# Patient Record
Sex: Female | Born: 1955 | Race: White | Hispanic: No | Marital: Married | Smoking: Current every day smoker
Health system: Southern US, Community
[De-identification: ages and names within clinical notes are randomized; demographics above are authoritative.]

## PROBLEM LIST (undated history)

## (undated) DIAGNOSIS — Z9289 Personal history of other medical treatment: Secondary | ICD-10-CM

## (undated) DIAGNOSIS — N979 Female infertility, unspecified: Secondary | ICD-10-CM

## (undated) DIAGNOSIS — E039 Hypothyroidism, unspecified: Secondary | ICD-10-CM

## (undated) DIAGNOSIS — L409 Psoriasis, unspecified: Secondary | ICD-10-CM

## (undated) DIAGNOSIS — I776 Arteritis, unspecified: Secondary | ICD-10-CM

## (undated) DIAGNOSIS — F329 Major depressive disorder, single episode, unspecified: Secondary | ICD-10-CM

## (undated) DIAGNOSIS — J449 Chronic obstructive pulmonary disease, unspecified: Secondary | ICD-10-CM

## (undated) DIAGNOSIS — Z8673 Personal history of transient ischemic attack (TIA), and cerebral infarction without residual deficits: Secondary | ICD-10-CM

## (undated) DIAGNOSIS — I10 Essential (primary) hypertension: Secondary | ICD-10-CM

## (undated) DIAGNOSIS — K219 Gastro-esophageal reflux disease without esophagitis: Secondary | ICD-10-CM

## (undated) DIAGNOSIS — F32A Depression, unspecified: Secondary | ICD-10-CM

## (undated) HISTORY — PX: CERVICAL CONIZATION W/BX: SHX1330

## (undated) HISTORY — PX: FOOT SURGERY: SHX648

## (undated) HISTORY — DX: Psoriasis, unspecified: L40.9

## (undated) HISTORY — DX: Personal history of other medical treatment: Z92.89

## (undated) HISTORY — DX: Personal history of transient ischemic attack (TIA), and cerebral infarction without residual deficits: Z86.73

## (undated) HISTORY — DX: Major depressive disorder, single episode, unspecified: F32.9

## (undated) HISTORY — DX: Depression, unspecified: F32.A

## (undated) HISTORY — DX: Hypothyroidism, unspecified: E03.9

## (undated) HISTORY — DX: Essential (primary) hypertension: I10

## (undated) HISTORY — DX: Gastro-esophageal reflux disease without esophagitis: K21.9

## (undated) HISTORY — DX: Female infertility, unspecified: N97.9

## (undated) HISTORY — DX: Arteritis, unspecified: I77.6

---

## 1997-10-16 ENCOUNTER — Encounter: Payer: Self-pay | Admitting: Family Medicine

## 1997-10-16 LAB — CONVERTED CEMR LAB: TSH: 2.88 microintl units/mL

## 1998-02-20 DIAGNOSIS — Z8673 Personal history of transient ischemic attack (TIA), and cerebral infarction without residual deficits: Secondary | ICD-10-CM

## 1998-02-20 HISTORY — DX: Personal history of transient ischemic attack (TIA), and cerebral infarction without residual deficits: Z86.73

## 1998-04-21 DIAGNOSIS — Z9289 Personal history of other medical treatment: Secondary | ICD-10-CM

## 1998-04-21 HISTORY — PX: OTHER SURGICAL HISTORY: SHX169

## 1998-04-21 HISTORY — DX: Personal history of other medical treatment: Z92.89

## 1998-05-18 ENCOUNTER — Other Ambulatory Visit: Admission: RE | Admit: 1998-05-18 | Discharge: 1998-05-18 | Payer: Self-pay | Admitting: Family Medicine

## 1998-07-22 DIAGNOSIS — Z9289 Personal history of other medical treatment: Secondary | ICD-10-CM

## 1998-07-22 HISTORY — DX: Personal history of other medical treatment: Z92.89

## 1998-10-01 ENCOUNTER — Encounter: Payer: Self-pay | Admitting: Neurology

## 1998-10-01 ENCOUNTER — Ambulatory Visit (HOSPITAL_COMMUNITY): Admission: RE | Admit: 1998-10-01 | Discharge: 1998-10-01 | Payer: Self-pay | Admitting: Neurology

## 1998-10-11 ENCOUNTER — Other Ambulatory Visit: Admission: RE | Admit: 1998-10-11 | Discharge: 1998-10-11 | Payer: Self-pay | Admitting: Family Medicine

## 1998-12-28 ENCOUNTER — Other Ambulatory Visit: Admission: RE | Admit: 1998-12-28 | Discharge: 1998-12-28 | Payer: Self-pay | Admitting: Obstetrics and Gynecology

## 1999-02-01 ENCOUNTER — Other Ambulatory Visit: Admission: RE | Admit: 1999-02-01 | Discharge: 1999-02-01 | Payer: Self-pay | Admitting: Obstetrics and Gynecology

## 1999-02-01 ENCOUNTER — Encounter (INDEPENDENT_AMBULATORY_CARE_PROVIDER_SITE_OTHER): Payer: Self-pay

## 1999-02-21 HISTORY — PX: PARTIAL HYSTERECTOMY: SHX80

## 1999-02-21 HISTORY — PX: CERVICAL CONE BIOPSY: SUR198

## 1999-02-25 ENCOUNTER — Ambulatory Visit (HOSPITAL_COMMUNITY): Admission: RE | Admit: 1999-02-25 | Discharge: 1999-02-25 | Payer: Self-pay | Admitting: Obstetrics and Gynecology

## 1999-02-25 ENCOUNTER — Encounter (INDEPENDENT_AMBULATORY_CARE_PROVIDER_SITE_OTHER): Payer: Self-pay

## 1999-03-07 ENCOUNTER — Encounter: Payer: Self-pay | Admitting: Obstetrics and Gynecology

## 1999-03-08 ENCOUNTER — Encounter (INDEPENDENT_AMBULATORY_CARE_PROVIDER_SITE_OTHER): Payer: Self-pay | Admitting: Specialist

## 1999-03-08 ENCOUNTER — Inpatient Hospital Stay (HOSPITAL_COMMUNITY): Admission: RE | Admit: 1999-03-08 | Discharge: 1999-03-11 | Payer: Self-pay | Admitting: Obstetrics and Gynecology

## 1999-09-29 ENCOUNTER — Other Ambulatory Visit: Admission: RE | Admit: 1999-09-29 | Discharge: 1999-09-29 | Payer: Self-pay | Admitting: Family Medicine

## 2000-11-20 LAB — HM DEXA SCAN: HM Dexa Scan: NORMAL

## 2000-12-03 ENCOUNTER — Other Ambulatory Visit: Admission: RE | Admit: 2000-12-03 | Discharge: 2000-12-03 | Payer: Self-pay | Admitting: Family Medicine

## 2002-01-21 ENCOUNTER — Other Ambulatory Visit: Admission: RE | Admit: 2002-01-21 | Discharge: 2002-01-21 | Payer: Self-pay | Admitting: Family Medicine

## 2003-01-23 ENCOUNTER — Other Ambulatory Visit: Admission: RE | Admit: 2003-01-23 | Discharge: 2003-01-23 | Payer: Self-pay | Admitting: Family Medicine

## 2003-12-25 ENCOUNTER — Ambulatory Visit: Payer: Self-pay | Admitting: Family Medicine

## 2004-01-22 ENCOUNTER — Ambulatory Visit: Payer: Self-pay

## 2004-10-25 ENCOUNTER — Ambulatory Visit: Payer: Self-pay | Admitting: Family Medicine

## 2004-10-25 ENCOUNTER — Other Ambulatory Visit: Admission: RE | Admit: 2004-10-25 | Discharge: 2004-10-25 | Payer: Self-pay | Admitting: Family Medicine

## 2004-10-25 LAB — CONVERTED CEMR LAB: Pap Smear: NORMAL

## 2004-10-26 ENCOUNTER — Encounter: Payer: Self-pay | Admitting: Family Medicine

## 2004-10-26 LAB — CONVERTED CEMR LAB: Pap Smear: NORMAL

## 2006-11-26 ENCOUNTER — Telehealth: Payer: Self-pay | Admitting: Family Medicine

## 2007-01-29 ENCOUNTER — Encounter: Payer: Self-pay | Admitting: Family Medicine

## 2007-01-29 DIAGNOSIS — L509 Urticaria, unspecified: Secondary | ICD-10-CM | POA: Insufficient documentation

## 2007-01-29 DIAGNOSIS — K219 Gastro-esophageal reflux disease without esophagitis: Secondary | ICD-10-CM | POA: Insufficient documentation

## 2007-01-29 DIAGNOSIS — I10 Essential (primary) hypertension: Secondary | ICD-10-CM | POA: Insufficient documentation

## 2007-01-29 DIAGNOSIS — F4323 Adjustment disorder with mixed anxiety and depressed mood: Secondary | ICD-10-CM | POA: Insufficient documentation

## 2007-01-29 DIAGNOSIS — Z8669 Personal history of other diseases of the nervous system and sense organs: Secondary | ICD-10-CM | POA: Insufficient documentation

## 2007-01-29 DIAGNOSIS — F1010 Alcohol abuse, uncomplicated: Secondary | ICD-10-CM | POA: Insufficient documentation

## 2007-01-29 DIAGNOSIS — E039 Hypothyroidism, unspecified: Secondary | ICD-10-CM | POA: Insufficient documentation

## 2007-01-29 DIAGNOSIS — L408 Other psoriasis: Secondary | ICD-10-CM | POA: Insufficient documentation

## 2007-01-29 DIAGNOSIS — F1910 Other psychoactive substance abuse, uncomplicated: Secondary | ICD-10-CM | POA: Insufficient documentation

## 2007-01-29 DIAGNOSIS — Z8679 Personal history of other diseases of the circulatory system: Secondary | ICD-10-CM | POA: Insufficient documentation

## 2007-02-04 ENCOUNTER — Ambulatory Visit: Payer: Self-pay | Admitting: Family Medicine

## 2007-02-04 DIAGNOSIS — F17200 Nicotine dependence, unspecified, uncomplicated: Secondary | ICD-10-CM | POA: Insufficient documentation

## 2007-02-04 DIAGNOSIS — M545 Low back pain, unspecified: Secondary | ICD-10-CM | POA: Insufficient documentation

## 2007-02-05 ENCOUNTER — Telehealth (INDEPENDENT_AMBULATORY_CARE_PROVIDER_SITE_OTHER): Payer: Self-pay | Admitting: *Deleted

## 2007-02-06 ENCOUNTER — Encounter: Payer: Self-pay | Admitting: Family Medicine

## 2007-02-08 ENCOUNTER — Encounter: Payer: Self-pay | Admitting: Family Medicine

## 2007-02-12 ENCOUNTER — Encounter (INDEPENDENT_AMBULATORY_CARE_PROVIDER_SITE_OTHER): Payer: Self-pay | Admitting: *Deleted

## 2007-02-12 LAB — CONVERTED CEMR LAB: Pap Smear: NORMAL

## 2007-06-28 ENCOUNTER — Telehealth: Payer: Self-pay | Admitting: Family Medicine

## 2007-08-13 ENCOUNTER — Telehealth (INDEPENDENT_AMBULATORY_CARE_PROVIDER_SITE_OTHER): Payer: Self-pay | Admitting: Internal Medicine

## 2008-03-13 ENCOUNTER — Telehealth: Payer: Self-pay | Admitting: Family Medicine

## 2008-08-27 ENCOUNTER — Telehealth: Payer: Self-pay | Admitting: Family Medicine

## 2008-12-15 ENCOUNTER — Ambulatory Visit: Payer: Self-pay | Admitting: Family Medicine

## 2008-12-25 ENCOUNTER — Encounter (INDEPENDENT_AMBULATORY_CARE_PROVIDER_SITE_OTHER): Payer: Self-pay | Admitting: *Deleted

## 2009-01-01 ENCOUNTER — Encounter: Payer: Self-pay | Admitting: Family Medicine

## 2009-01-07 ENCOUNTER — Encounter: Payer: Self-pay | Admitting: Family Medicine

## 2009-01-18 ENCOUNTER — Encounter (INDEPENDENT_AMBULATORY_CARE_PROVIDER_SITE_OTHER): Payer: Self-pay | Admitting: *Deleted

## 2009-01-18 LAB — HM MAMMOGRAPHY: HM Mammogram: NORMAL

## 2010-03-07 ENCOUNTER — Encounter: Payer: Self-pay | Admitting: Family Medicine

## 2010-03-07 ENCOUNTER — Ambulatory Visit: Payer: Self-pay | Admitting: Dermatology

## 2010-03-08 ENCOUNTER — Ambulatory Visit
Admission: RE | Admit: 2010-03-08 | Discharge: 2010-03-08 | Payer: Self-pay | Source: Home / Self Care | Attending: Family Medicine | Admitting: Family Medicine

## 2010-03-08 DIAGNOSIS — R599 Enlarged lymph nodes, unspecified: Secondary | ICD-10-CM | POA: Insufficient documentation

## 2010-03-08 DIAGNOSIS — L02419 Cutaneous abscess of limb, unspecified: Secondary | ICD-10-CM | POA: Insufficient documentation

## 2010-03-08 DIAGNOSIS — L538 Other specified erythematous conditions: Secondary | ICD-10-CM | POA: Insufficient documentation

## 2010-03-08 DIAGNOSIS — L03119 Cellulitis of unspecified part of limb: Secondary | ICD-10-CM

## 2010-03-20 LAB — CONVERTED CEMR LAB: Pap Smear: NORMAL

## 2010-03-24 NOTE — Assessment & Plan Note (Signed)
Summary: swollen lymph node in groin area/alc   Vital Signs:  Patient profile:   55 year old female Height:      64 inches Weight:      217.25 pounds BMI:     37.43 Temp:     98.5 degrees F oral Pulse rate:   72 / minute Pulse rhythm:   regular BP sitting:   110 / 86  (right arm) Cuff size:   large  Vitals Entered By: Linde Gillis CMA Duncan Dull) (March 08, 2010 1:48 PM) CC: swollen lymph node in groin area   History of Present Illness: has an outbreak of psoriasis - worse than it has ever been  seeing Dr Cheree Ditto -- derm had a bad infection of her leg last week - and they put her on abx  had a follow up on monday--is looking better  was wearing bandage to the knee  also had a doppler on her leg to r/o blood clot  tech saw a swollen LN in groin  on R side   is finally starting to feel better from her infection  pain is intermittent an not as bad   is on abx -? poss septra   .6-.8 cm LN seen in R groin on [place]      Allergies: 1)  ! Benadryl  Past History:  Past Medical History: Last updated: 01/29/2007 Depression GERD Hypertension Hypothyroidism Transient ischemic attack, hx of  Past Surgical History: Last updated: 01/29/2007 Hosp- 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 Eyes:  Denies blurring and eye irritation. CV:  Denies chest pain or discomfort and palpitations. Resp:  Denies cough, shortness of breath, and wheezing. GI:  Denies abdominal pain, nausea, and vomiting. GU:  Denies hematuria and urinary frequency. MS:  Complains of stiffness. Derm:  Complains of itching, lesion(s), and rash; a little itching in groin area . Neuro:  Denies numbness and tingling. Endo:  Denies cold intolerance and heat intolerance; does tend to stay sweaty. Heme:  Denies abnormal bruising and bleeding.  Physical Exam  General:  overweight but generally well appearing  Head:  normocephalic, atraumatic, and no abnormalities observed.   Neck:  supple with full rom and no masses or thyromegally, no JVD or carotid bruit  Lungs:  diffusely distant bs no crackles or wheeze Heart:  Normal rate and regular rhythm. S1 and S2 normal without gallop, murmur, click, rub or other extra sounds. Msk:  no acute joint changes  Pulses:  R and L carotid,radial,femoral,dorsalis pedis and posterior tibial pulses are full and equal bilaterally Extremities:  no cce  Skin:  psoriasis with scales and plaques diffusely area of erythema and small amt of streaking/ tenderness lat L lower leg - scabbing and no drainage  some erythema with satellite lesions in groin bilaterally  Cervical Nodes:  No lymphadenopathy noted Inguinal Nodes:  unable to palp any LN Psych:  normal affect, talkative and pleasant  Impression & Recommendations:  Problem # 1:  ENLARGEMENT OF LYMPH NODES (ICD-785.6) Assessment New small LN enl in groin that is non palpable seen on [place]  I think this is likely reactive from cellulitis of leg and also yeast dermatitis in groin/ and also psoriasis will continue abx for cellulitis and also start nystatin in groin re check [place] of LN in 6-8 weeks when imp Orders: Radiology Referral (Radiology)  Problem # 2:  INTERTRIGO, CANDIDAL  (ZOX-096.04) Assessment: New  tx this with nystatin  adv to keep it clean and dry  update if not imp  Orders: Prescription Created Electronically [phone])  Problem # 3:  CELLULITIS, LEG, LEFT (ICD-682.6) Assessment: New 2ndary infx from psoriasis that is improving with abx continue derm f/u  Complete Medication List: 1)  Wellbutrin Sr 150 Mg Tb12 (Bupropion hcl) .Marland Kitchen.. 1 by mouth two times a day 2)  Synthroid 50 Mcg Tabs (Levothyroxine sodium) .Marland Kitchen.. 1 by mouth daily 3)  Triamterene-hctz 37.5-25 Mg Tabs (Triamterene-hctz) .... 2  by mouth daily 4)  Cozaar 100 Mg Tabs (Losartan potassium) .Marland Kitchen.. 1 by mouth daily 5)  Prilosec 20 Mg Cpdr (Omeprazole) .Marland Kitchen.. 1 by mouth daily 6)  Chantix Starting Month Pak 0.5 Mg X 11 & 1 Mg X 42 Misc (Varenicline tartrate) .... Take by mouth as directed 7)  Chantix 1 Mg Tabs (Varenicline tartrate) .Marland Kitchen.. 1 by mouth two times a day to start after starter pack 8)  Nystatin 100000 Unit/gm Crea (Nystatin) .... Apply to redness in groin area two times a day  Patient Instructions: 1)  use nystatin on groin area for yeast  2)  finish your antibiotics as scheduled  3)  schedule ultrasound of R groin in 6-8 weeks to re check lymph node  4)  keep me updated if anything changes  Prescriptions: NYSTATIN 100000 UNIT/GM CREA (NYSTATIN) apply to redness in groin area two times a day  #1 medium x 1   Entered and Authorized by:   Judith Part MD   Signed by:   Judith Part MD on 03/08/2010   Method used:   Electronically to        [facility] [phone]* (retail)       [address]       [place], [place]  [address]       Ph: [phone]       Fax: [phone]   RxID:   [id number]    Orders Added: 1)  Radiology Referral [Radiology] 2)  Est. Patient Level III [[address]] 3)  Prescription Created Electronically [phone]    Current Allergies (reviewed today): ! BENADRYL

## 2010-04-26 ENCOUNTER — Telehealth: Payer: Self-pay | Admitting: Family Medicine

## 2010-05-09 ENCOUNTER — Encounter: Payer: Self-pay | Admitting: Family Medicine

## 2010-05-19 NOTE — Progress Notes (Signed)
Summary: Rt groin ultrasound  ---- Converted from flag ---- ---- 03/08/2010 3:56 PM, Carlton Adam wrote: R Groin Ultrasound at [facility] on 04/19/2010 at 10am.  ---- 03/08/2010 2:20 PM, Judith Part MD wrote: The following orders have been entered for this patient and placed on Admin Hold:  Type:     Referral       Code:   Radiology Description:   Radiology Referral Order Date:   03/08/2010   Authorized By:   Judith Part MD Order #:   [id number] Clinical Notes:   Name of Test or Procedure: refer for ultrasound of R groin in 6-8 weeks to re check small LN seen on ultrasound this week that is likely reactive  [facility] please  Of What:  Special Instructions ------------------------------  Phone Note Outgoing Call   Call placed by: Lewanda Rife LPN Call placed to: Patient Summary of Call: Spoke with Carly at [facility] medical records. Pt did not keep appt on 04/19/10 at 10am for rt groin ultrasound. Left message at pt's home and work # for patient to call back.  Initial call taken by: Lewanda Rife LPN,  April 26, 2010 11:33 AM  Follow-up for Phone Call        pt called and left v/m to call [phone].Left message on v/m for patient to call back. Lewanda Rife LPN  April 26, 1912 11:04 AM   Additional Follow-up for Phone Call Additional follow up Details #1::        Pt said that she had to cancel appt for ultrasound of groin because she has been seeing a vascular surgeon in [place] and the surgeon  said the problem with the lymph gland in  the groin is related to the wound on pt's leg. Pt said vascular dr has "killed portion of vein in leg" and she is to see vascular surgeon on 04/29/10 and the surgeon may refer pt to wound center for futher treatment. Pt will ask vascular surgeon in [place] to send Dr Milinda Antis pt's office notes.Lewanda Rife LPN  April 27, 7827 1:35 PM     Additional Follow-up for Phone Call Additional follow up Details #2::    thanks for the heads up - will rev notes when I get  them Follow-up by: Judith Part MD,  April 27, 2010 1:40 PM  Additional Follow-up for Phone Call Additional follow up Details #3:: Details for Additional Follow-up Action Taken: Left message for patient to call back. We still do not have notes from [facility] Dr. I plan to ask pt for [facility] dr's name and contact info.Lewanda Rife LPN  May 04, 2010 10:03 AM   Still unable to reach pt by phone. I mailed a letter requesting info for vascular dr in [place].Lewanda Rife LPN  May 09, 2010 3:17 PM

## 2010-05-19 NOTE — Letter (Signed)
Summary: Generic Letter  Adin at [facility]  [address] [place], [place] [address]   Phone: [phone]  Fax: [phone]    05/09/2010    [facility] [address] [place], [place]  [address]    Dear Ms. Cousins,  We spoke on 04/27/10 about you cancelling your appt for ultrasound of groin and  you were going to ask the vascular doctor in [place] to send your office notes to Dr. Milinda Antis. We still have not received the office notes from the doctor in [place] and I have left messages for you to please call Dr Royden Purl office so I might get the name and contact information for the vascular doctor in [place].   Please contact our office [phone] with the name and contact information for the vascular doctor in [place].       Sincerely,   Lewanda Rife LPN

## 2010-07-08 NOTE — Op Note (Signed)
[facility]  Patient:    Stephanie Raymond                     MRN: [id number] Proc. Date: 02/25/99 Adm. Date:  [id number] Attending:  Osborn Coho                           Operative Report  PREOPERATIVE DIAGNOSIS:       Cervical dysplasia.  Inadequate colposcopy.  POSTOPERATIVE DIAGNOSIS:      Cervical dysplasia.  Inadequate colposcopy.  OPERATION:                    Cold knife conization.  SURGEON:                      Mark E. Dareen Piano, M.D.  ASSISTANT:  ANESTHESIA:                   General anesthesia.  ANTIBIOTICS:                  None.  DRAINS:                       Red rubber catheter to bladder.  ESTIMATED BLOOD LOSS:         25 cc.  COMPLICATIONS:                None.  SPECIMENS:                    Cervical cone sent to pathology.  DESCRIPTION OF PROCEDURE:     The patient was taken to the operating room where a general anesthesia was administered without complications.  She was then placed in the dorsal lithotomy position and prepped with Hibiclens.  Her bladder was drained with a red rubber catheter.  A weighted speculum was placed in the vagina.  A single tooth tenaculum was applied to the anterior cervical lip.  The cervix was then injected with 1% lidocaine.  A suture was then placed at 3 and 9 oclock in  the fornix for hemostasis.  0 chromic suture was used.  Next, an 11 blade knife was used to create a cone specimen.  Deep conization was performed.  The remaining cervix was then made hemostatic with 0 chromic suture.  The patient was taken to the recovery room in stable condition.  Sponge, needle, and instrument counts were correct x 2. DD:  02/25/99 TD:  02/26/99 Job: [id number]

## 2010-07-21 ENCOUNTER — Ambulatory Visit: Payer: Self-pay | Admitting: Internal Medicine

## 2010-07-26 ENCOUNTER — Encounter: Payer: Self-pay | Admitting: Family Medicine

## 2010-07-27 ENCOUNTER — Encounter: Payer: Self-pay | Admitting: Family Medicine

## 2010-07-27 ENCOUNTER — Ambulatory Visit (INDEPENDENT_AMBULATORY_CARE_PROVIDER_SITE_OTHER): Payer: 59 | Admitting: Family Medicine

## 2010-07-27 VITALS — BP 130/84 | HR 100 | Temp 98.3°F | Ht 64.0 in | Wt 219.0 lb

## 2010-07-27 DIAGNOSIS — I10 Essential (primary) hypertension: Secondary | ICD-10-CM

## 2010-07-27 MED ORDER — BUPROPION HCL ER (SR) 150 MG PO TB12: 300.0000 mg | ORAL_TABLET | Freq: Every day | ORAL | Status: DC

## 2010-07-27 MED ORDER — LEVOTHYROXINE SODIUM 50 MCG PO TABS: 50.0000 ug | ORAL_TABLET | Freq: Every day | ORAL | Status: DC

## 2010-07-27 MED ORDER — LOSARTAN POTASSIUM 100 MG PO TABS: 100.0000 mg | ORAL_TABLET | Freq: Every day | ORAL | Status: DC

## 2010-07-27 MED ORDER — OMEPRAZOLE 20 MG PO CPDR: 20.0000 mg | DELAYED_RELEASE_CAPSULE | Freq: Every day | ORAL | Status: DC

## 2010-07-27 MED ORDER — TRIAMTERENE-HCTZ 37.5-25 MG PO TABS: 1.0000 | ORAL_TABLET | Freq: Every day | ORAL | Status: DC

## 2010-07-27 NOTE — Progress Notes (Signed)
Subjective:    Patient ID: Stephanie Raymond, female    DOB: 01/19/1956, 55 y.o.   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- did an ablation / and sclerosis  Still dealing with the wound -- not in the pain she was in  Has next follow up in July   Severe outbreak of psoriasis  Derm put her on humira -- was miserable -- 3 mo and was not helping  Then give px for cyclosporin -- big dose (and warned this could inc  Bp) Also laser tx for psoriasis  bp 186/106 -- at derm-- walked her down to urgent care  Was seen and went down to 150s on clonidine  Attributes it to the cyclospirin   Now at home her bp is back to normal  No cp or sob or ha or edema  No problems with her current bp med   Has been much more stressed- problems at home - unfortunately This may contribute to the worse psoriasis   Still drinking - but much less Disc eff of this on her bp as well   Needs px refilled until f/u for lab and PE in fall   Patient Active Problem List  Diagnoses  . HYPOTHYROIDISM  . ALCOHOL ABUSE  . TOBACCO USE  . DRUG ABUSE  . DEPRESSION  . HYPERTENSION  . GERD  . PSORIASIS  . URTICARIA  . BACK PAIN, LUMBAR  . CARPAL TUNNEL SYNDROME, HX OF  . TRANSIENT ISCHEMIC ATTACK, HX OF  . CELLULITIS, LEG, LEFT  . INTERTRIGO, CANDIDAL  . ENLARGEMENT OF LYMPH NODES   Past Medical History  Diagnosis Date  . Depression   . GERD (gastroesophageal reflux disease)   . Hypertension   . Hypothyroidism   . Hx of transient ischemic attack (TIA) 2000    Hosp ?  Marland Kitchen Infertility, female   . History of CT scan 04/1998    small lacunar infarcts  . History of MRI of brain and brain stem 07/1998    white matter changes   Past Surgical History  Procedure Date  . Cervical conization w/bx 1980's  . Carotid ultrasound 04/1998    neg  . Foot surgery     toe treated,  as a child  . Cervical cone biopsy 02/1999    microinvasive cervical carcinoma  . Partial hysterectomy 02/1999   History  Substance Use Topics  . Smoking status: Current Everyday Smoker  . Smokeless tobacco: Not on file  . Alcohol Use: Not on file   Family History  Problem Relation Age of Onset  . Emphysema Mother     smoker  . Heart disease Father 58    CABG  . Heart disease Other     MI in 39s  . Heart disease Other     CHF  . Hypertension Other    Allergies  Allergen Reactions  . Diphenhydramine Hcl     REACTION: feel funny   No current outpatient prescriptions on file prior to visit.       Review of Systems Review of Systems  Constitutional: Negative for fever, appetite change, fatigue and unexpected weight change.  Eyes: Negative for pain and visual disturbance.  Respiratory: Negative for cough and shortness of breath.   Cardiovascular: Negative for cp or sob or palp.   Gastrointestinal: Negative for nausea, diarrhea  and constipation.  Genitourinary: Negative for urgency and frequency.  Skin: Negative for pallor. Pos for rash- psoriasis  Neurological: Negative for weakness, light-headedness, numbness and headaches.  Hematological: Negative for adenopathy. Does not bruise/bleed easily.  Psychiatric/Behavioral: Negative for dysphoric mood. The patient is somewhat anx due to social situation          Objective:   Physical Exam  Constitutional: She appears well-developed and well-nourished.       overwt and well appearing   HENT:  Head: Normocephalic and atraumatic.  Mouth/Throat: Oropharynx is clear and moist.  Eyes: Conjunctivae and EOM are normal. Pupils are equal, round, and reactive to light.  Neck: Normal range of motion. Neck supple. No JVD present. No thyromegaly present.  Cardiovascular: Normal rate, regular rhythm, normal heart sounds and intact distal pulses.        Pulse in mid 80s at rest   Pulmonary/Chest: Effort normal. No respiratory  distress. She has wheezes. She has no rales. She exhibits no tenderness.       Mild wheezes at bases  Abdominal: Soft. Bowel sounds are normal. She exhibits no distension, no abdominal bruit and no mass. There is no hepatosplenomegaly. There is no tenderness.  Musculoskeletal: She exhibits no edema and no tenderness.  Lymphadenopathy:    She has no cervical adenopathy.  Neurological: She is alert. She has normal reflexes.       Baseline hand tremor at rest - suspect related to alcohol?  Skin: Skin is warm and dry. Rash noted. No pallor.       Diffuse psoriasis   Psychiatric: She has a normal mood and affect.       Has tremor but not seemingly nervous today Spirits are up          Assessment & Plan:

## 2010-07-27 NOTE — Assessment & Plan Note (Signed)
bp was up on cyclospirin and now back down to baseline  Is off med No change in HTN meds Disc lifestyle change  Refilled meds Lab and PE in early fall

## 2010-07-27 NOTE — Patient Instructions (Signed)
Blood pressure is reassuring  We will continue to watch it  Avoid high sodium foods and drink a lot of water  Try to keep fit  Schedule PE with labs prior in early fall

## 2010-07-28 ENCOUNTER — Telehealth: Payer: Self-pay | Admitting: *Deleted

## 2010-07-28 NOTE — Telephone Encounter (Signed)
Could not find fax # and office closed already will call in AM to get fax.

## 2010-07-28 NOTE — Telephone Encounter (Signed)
Also, someone from Dr. Dwana Curd (dermatology) office called regarding the pt's elevated BP and Dr.Graham would like for you to call her tomorrow at [phone].

## 2010-07-28 NOTE — Telephone Encounter (Signed)
Pt was seen yesterday and given several scripts.  She was given scripts for maxzide and cozaar, but these were written for 30 day supplies and pt needs scripts for 90 day supplies.  She is asking that the scripts be rewritten and mailed to her at her home address.

## 2010-07-28 NOTE — Telephone Encounter (Signed)
Could you please do me a favor and fax over a copy of my recent progress note with her to her dermatologist? - thanks Then send the message back to me

## 2010-07-29 NOTE — Telephone Encounter (Signed)
Faxed 07/27/10 progress note to Dr Cheree Ditto [phone] fax.

## 2010-08-02 ENCOUNTER — Other Ambulatory Visit: Payer: Self-pay | Admitting: *Deleted

## 2010-08-02 MED ORDER — LOSARTAN POTASSIUM 100 MG PO TABS: 100.0000 mg | ORAL_TABLET | Freq: Every day | ORAL | Status: DC

## 2010-08-02 MED ORDER — TRIAMTERENE-HCTZ 37.5-25 MG PO TABS: 1.0000 | ORAL_TABLET | Freq: Every day | ORAL | Status: DC

## 2010-08-02 NOTE — Telephone Encounter (Signed)
Left message on pt's home and work v/m for pt to call back. Rxs mailed to pt's home address as instructed.

## 2010-08-02 NOTE — Telephone Encounter (Signed)
Pt is requesting written 90 day scripts for maxzide and cozaar, she wants these mailed to her. She was given 30 day scripts at her last office visit, but that isnt what she wanted.  Please mail to pt's home address.

## 2010-08-02 NOTE — Telephone Encounter (Signed)
I thought we re did cozaar for 90 - but just in case will do again  Px printed for pick up in IN box

## 2010-08-02 NOTE — Telephone Encounter (Signed)
Patient notified as instructed by telephone. Confirmed pt's mailing address and Geraldine Contras said OK to mail rx that is not controlled substance.

## 2010-08-05 ENCOUNTER — Telehealth: Payer: Self-pay

## 2010-08-05 MED ORDER — TRIAMTERENE-HCTZ 37.5-25 MG PO TABS: 2.0000 | ORAL_TABLET | Freq: Every day | ORAL | Status: DC

## 2010-08-05 NOTE — Telephone Encounter (Signed)
Pt received rx and pt takes Maxzide 37.5/25 two tabs daily. Pt wants rx mailed to her home.

## 2010-08-05 NOTE — Telephone Encounter (Signed)
Maxzide rx mailed to pt as instructed.

## 2010-09-02 ENCOUNTER — Ambulatory Visit: Payer: Self-pay | Admitting: Family Medicine

## 2010-09-13 ENCOUNTER — Telehealth: Payer: Self-pay | Admitting: *Deleted

## 2010-09-13 NOTE — Telephone Encounter (Signed)
Pt is again requesting that you call her dermatologist regarding her BP.  She wants to discuss pt using clonidine to control her BP.  If you are unable to speak with Dr. Cheree Ditto pt is asking if you will prescribe this for her.  [facility] [place]  Dr. Dwana Curd number is [phone].

## 2010-09-13 NOTE — Telephone Encounter (Signed)
Patient notified as instructed by telephone. Pt said she felt so bad today(felt like BP up) she came home from work. Pt said she cannot come in to see Dr Milinda Antis tomorrow because she will be in [place]. Pt said she has appt to see Dr Cheree Ditto on Thurs and she will get BP taken there and call the reading. Pt has #10 Clonidine 0.2mg  at home and pt said she is only supposed to take one if BP is elevated. Pt said she is not supposed to take daily. Pt said she will call Dr Royden Purl office tomorrow around 10am to see if can work pt in schedule on Friday and to clarify directions for Clonidine. I advised pt if she felt like BP was elevated tonight to go to UC or ER to get BP taken. Pt said she does not have BP cuff at home.

## 2010-09-13 NOTE — Telephone Encounter (Signed)
I spoke to Dr Cheree Ditto  She told me that pt is on small dose and cyclosporine and also small dose of clonidine to control bp It is very important that I follow her bp for this  She needs appt with me as soon as we can arrange one I do not want her to stop clonidine suddenly as this can cause side effects  Let me know her dose and frequency and see if she needs small amt called in to get her through until I see her  In addition Dr Cheree Ditto stressed that it is vitally important that she gets bp checked twice weekly and calls the results in to her office as well  I will disc further when I see her

## 2010-09-13 NOTE — Telephone Encounter (Signed)
I want her to go ahead and take the clonidine daily  F/u with Dr Cheree Ditto on Thursday  Strongly consider getting bp cuff for home - I recommend OMRON for the arm available at most drug stores/ [facility]/ [employer] etc  Put her in my 4:15 for Friday if it is open please  Thanks  Please add clonidine .2mg  to her med list -- sounds like she has enough to get to friday

## 2010-09-14 NOTE — Telephone Encounter (Signed)
Patient notified as instructed by telephone. Appt with Dr Milinda Antis scheduled 09/16/10 at 4:15p[m. Pt will call with BP reading from Dr Cheree Ditto. Pt will also call back if worsens before appt. Med list updated as instructed.

## 2010-09-14 NOTE — Telephone Encounter (Signed)
Left v/m at home and cell # for pt to call back. 

## 2010-09-16 ENCOUNTER — Ambulatory Visit (INDEPENDENT_AMBULATORY_CARE_PROVIDER_SITE_OTHER): Payer: 59 | Admitting: Family Medicine

## 2010-09-16 ENCOUNTER — Encounter: Payer: Self-pay | Admitting: Family Medicine

## 2010-09-16 DIAGNOSIS — F1010 Alcohol abuse, uncomplicated: Secondary | ICD-10-CM

## 2010-09-16 DIAGNOSIS — E039 Hypothyroidism, unspecified: Secondary | ICD-10-CM

## 2010-09-16 DIAGNOSIS — I10 Essential (primary) hypertension: Secondary | ICD-10-CM

## 2010-09-16 DIAGNOSIS — L408 Other psoriasis: Secondary | ICD-10-CM

## 2010-09-16 DIAGNOSIS — F17200 Nicotine dependence, unspecified, uncomplicated: Secondary | ICD-10-CM

## 2010-09-16 MED ORDER — CLONIDINE HCL 0.2 MG PO TABS: 0.2000 mg | ORAL_TABLET | Freq: Every day | ORAL | Status: DC

## 2010-09-16 NOTE — Progress Notes (Signed)
Subjective:    Patient ID: Stephanie Raymond, female    DOB: 10/11/1955, 55 y.o.   MRN: [id number]  HPI Here for f/u of elevated bp with cyclosporine tx of her psoriasis (is on small dose) I did speak with her dermatologist Dr Cheree Ditto at [facility]- wants to proced with this tx so started her on clonidine.2 daily   mg daily  (also had a long conversation about compliance/ regular twice weekly bp checks / imp of not w/d med too quickly / plan for intermittent cyclosporine-- will be quite tricky)  Will be on pulse therapy  On higher doses - had severely high blood pressure   Wanted several bp checks weekly Is going to get omron cuff for arm  Feels fine on clonidine - no side eff at all   Pt states she is still drinking but much less Understands dangers of alcohol with current meds Not ready to quit altogether  Will not tell me what her daily intake is Has not had any withdrawal Always has a lot of stress    Was supposed to go to derm yesterday  Had an arm problem - and had to see ortho - given a cortisone shot  Feels much better now  Did tell her derm why she had to cancel Is ready to buy her bp cuff  Also getting laser therapy on her legs - that works well - had a non healing wound for a while but now is improved   Patient Active Problem List  Diagnoses  . HYPOTHYROIDISM  . ALCOHOL ABUSE  . TOBACCO USE  . DEPRESSION  . HYPERTENSION  . GERD  . PSORIASIS  . URTICARIA  . BACK PAIN, LUMBAR  . CARPAL TUNNEL SYNDROME, HX OF  . TRANSIENT ISCHEMIC ATTACK, HX OF  . INTERTRIGO, CANDIDAL  . ENLARGEMENT OF LYMPH NODES   Past Medical History  Diagnosis Date  . Depression   . GERD (gastroesophageal reflux disease)   . Hypertension   . Hypothyroidism   . Hx of transient ischemic attack (TIA) 2000    Hosp ?  [facility], [facility]   . History of CT scan 04/1998    small lacunar infarcts  . History of MRI of brain and brain stem 07/1998    white matter changes   Past Surgical  History  Procedure Date  . Cervical conization w/bx 1980's  . Carotid ultrasound 04/1998    neg  . Foot surgery     toe treated, as a child  . Cervical cone biopsy 02/1999    microinvasive cervical carcinoma  . Partial hysterectomy 02/1999   History  Substance Use Topics  . Smoking status: Current Everyday Smoker  . Smokeless tobacco: Not on file  . Alcohol Use: Not on file   Family History  Problem Relation Age of Onset  . Emphysema Mother     smoker  . Heart disease Father 74    CABG  . Heart disease Other     MI in 26s  . Heart disease Other     CHF  . Hypertension Other    Allergies  Allergen Reactions  . Diphenhydramine Hcl     REACTION: feel funny   Current Outpatient Prescriptions on File Prior to Visit  Medication Sig Dispense Refill  . buPROPion (WELLBUTRIN SR) 150 MG 12 hr tablet Take 2 tablets (300 mg total) by mouth daily.  180 tablet  1  . Cholecalciferol (VITAMIN D-3 PO) Take 1 capsule by mouth daily.        Marland Kitchen  levothyroxine (SYNTHROID) 50 MCG tablet Take 1 tablet (50 mcg total) by mouth daily.  90 tablet  1  . losartan (COZAAR) 100 MG tablet Take 1 tablet (100 mg total) by mouth daily.  90 tablet  3  . omeprazole (PRILOSEC) 20 MG capsule Take 1 capsule (20 mg total) by mouth daily.  90 capsule  1  . triamterene-hydrochlorothiazide (MAXZIDE-25) 37.5-25 MG per tablet Take 2 tablets by mouth daily.  180 tablet  3  . vitamin B-12 (CYANOCOBALAMIN) 500 MCG tablet Take 500 mcg by mouth daily.              Review of Systems Review of Systems  Constitutional: Negative for fever, appetite change, fatigue and unexpected weight change.  Eyes: Negative for pain and visual disturbance. or pain Respiratory: Negative for cough and shortness of breath.   Cardiovascular: Negative.  for cp or palpitations Gastrointestinal: Negative for nausea, diarrhea and constipation.  Genitourinary: Negative for urgency and frequency.  Skin: Negative for pallor. pos for rash of  psoriasis  Neurological: Negative for weakness, light-headedness, numbness and headaches.  Hematological: Negative for adenopathy. Does not bruise/bleed easily.  Psychiatric/Behavioral: Negative for dysphoric mood. Pos for chronic anx and stressors         Objective:   Physical Exam  Constitutional: She appears well-developed and well-nourished. No distress.       overwt and well appearing   HENT:  Head: Normocephalic and atraumatic.  Nose: Nose normal.  Mouth/Throat: Oropharynx is clear and moist.  Eyes: Conjunctivae and EOM are normal. Pupils are equal, round, and reactive to light. No scleral icterus.  Neck: Normal range of motion. Neck supple. No JVD present. Carotid bruit is not present. No thyromegaly present.  Cardiovascular: Normal rate, regular rhythm, normal heart sounds and intact distal pulses.   Pulmonary/Chest: Effort normal and breath sounds normal. No respiratory distress. She has no wheezes.  Abdominal: Soft. Bowel sounds are normal. She exhibits no distension and no mass. There is no tenderness.  Musculoskeletal: She exhibits no edema.       No acute joint changes   Lymphadenopathy:    She has no cervical adenopathy.  Neurological: She is alert. She has normal reflexes.       Baseline hand tremor with rest and intention  Skin: Skin is warm and dry.       Psoriasis is slt improved Worst on legs   Psychiatric: She has a normal mood and affect. Her behavior is normal.       Is a little anxious but overall in good mood today Good eye contact and communication skills           Assessment & Plan:   No problem-specific assessment & plan notes found for this encounter.

## 2010-09-16 NOTE — Assessment & Plan Note (Signed)
Again stressed imp of minimizing alcohol with intent to quit Esp with current meds and conditions

## 2010-09-16 NOTE — Assessment & Plan Note (Addendum)
Now back on low dose cyclosporine and will need to tx and watch bp carefully Refilled clonidine.2  Will have regular derm f/u and biweekly bp - disc cuff to get for home  Also aware not to come off med urgently  >25 min spent with face to face with patient, >50% counseling and/or coordinating care   Disc low salt diet Disc minimizing alcohol and working on smoking cessation (down to 2 cig per day)  In addn-health mt visit in about 2 mo Lab now (expect elevated lft due to alcohol intake)

## 2010-09-16 NOTE — Patient Instructions (Addendum)
Continue the clonidine and do not miss doses  If any side effects let me know  Get your bp cuff OMRON for the arm size regular  Keep sending Dr Cheree Ditto bp reading twice weekly and update me if bp is above 140/90 Also watch salt in diet  Blood pressure on 2nd check was 128/80 -- good  Schedule PE in about 2 months (end of the day appt --and 30 minute appt we can put together)  Labs today

## 2010-09-16 NOTE — Assessment & Plan Note (Addendum)
Lab Results  Component Value Date   TSH 2.88 10/16/1997   Lab today - also in light of htn Disc further at 2 mo f/u

## 2010-09-18 NOTE — Assessment & Plan Note (Signed)
Pt is smoking less and working on quitting  Enc her Disc in detail risks of smoking and possible outcomes including copd, vascular/ heart disease, cancer , respiratory and sinus infections  Pt voices understanding

## 2010-09-18 NOTE — Assessment & Plan Note (Signed)
On low dose cyclosporine- pulse tx  Had long disc with her dermatologist by phone over long term plan / elevation in bp and need to tx this and monitor very closely  Will continue with twice weekly bp and close f/u  It is improving on the cyclosporine

## 2010-09-19 ENCOUNTER — Other Ambulatory Visit: Payer: 59

## 2010-09-20 ENCOUNTER — Other Ambulatory Visit: Payer: 59

## 2010-09-21 ENCOUNTER — Other Ambulatory Visit: Payer: 59

## 2010-09-23 ENCOUNTER — Other Ambulatory Visit: Payer: Self-pay | Admitting: Family Medicine

## 2010-09-23 DIAGNOSIS — I10 Essential (primary) hypertension: Secondary | ICD-10-CM

## 2010-09-23 DIAGNOSIS — E039 Hypothyroidism, unspecified: Secondary | ICD-10-CM

## 2010-10-14 ENCOUNTER — Ambulatory Visit (INDEPENDENT_AMBULATORY_CARE_PROVIDER_SITE_OTHER): Payer: 59 | Admitting: Family Medicine

## 2010-10-14 ENCOUNTER — Encounter: Payer: Self-pay | Admitting: Family Medicine

## 2010-10-14 ENCOUNTER — Other Ambulatory Visit: Payer: Self-pay | Admitting: *Deleted

## 2010-10-14 VITALS — BP 150/100 | HR 68 | Temp 98.3°F | Wt 206.0 lb

## 2010-10-14 DIAGNOSIS — R112 Nausea with vomiting, unspecified: Secondary | ICD-10-CM | POA: Insufficient documentation

## 2010-10-14 MED ORDER — ONDANSETRON HCL 4 MG PO TABS: 4.0000 mg | ORAL_TABLET | Freq: Every day | ORAL | Status: DC | PRN

## 2010-10-14 NOTE — Progress Notes (Signed)
  Subjective:    Patient ID: Stephanie Raymond, female    DOB: 07-Aug-1955, 55 y.o.   MRN: [id number]  HPI CC: nausea,vomiting  7 d h/o feeling ill.  Started with nausea, decreased appetite.  Some emesis - NBNB, mucous.  Only eating a few pretzels daily.  Feeling weaker.  Down 6 lbs in last week.  Drinking ok - ginger ale and water, at least voiding 4-5x/day.  Tried OTC nausea medicine.  Mild mid abd pain and some on left upper quadrant, tolerable.  No stool changes, diarrhea, fevers/chills, sick contacts, hasn't eaten anything differently recently.  No dysphagia.  No night sweats.  Had tough year recently, increased stress.  BP up today.  Wt Readings from Last 3 Encounters:  10/14/10 206 lb (93.441 kg)  09/16/10 212 lb (96.163 kg)  07/27/10 219 lb (99.338 kg)   Smoking 1-2 cig/day, currently none because feeling nauseated.  Stays wheezy, denies SOB, cough. EtOH - 2 glasses wine daily, denies more.  Occasional beer.  H/o etoh abuse. No new medicines.  No recent blood work or CPE however states h/o elevated LFTs 4x norm in past, never had [place].  States tends to bruise easily.  Review of Systems Per HPI    Objective:   Physical Exam  Nursing note and vitals reviewed. Constitutional: She appears well-developed and well-nourished. No distress.  HENT:  Head: Normocephalic and atraumatic.  Mouth/Throat: Oropharynx is clear and moist. No oropharyngeal exudate.  Eyes: EOM are normal. Pupils are equal, round, and reactive to light. No scleral icterus.       Mild conjunctival injection.  Cardiovascular: Normal rate, regular rhythm, normal heart sounds and intact distal pulses.   No murmur heard. Pulmonary/Chest: No respiratory distress. She has wheezes. She has no rales.       Prolonged exp phase with some exp wheezing  Abdominal: Bowel sounds are normal. She exhibits distension. She exhibits no pulsatile liver and no abdominal bruit. There is hepatomegaly. There is no splenomegaly. There is  no tenderness. There is no rigidity, no rebound and no guarding.  Musculoskeletal: She exhibits edema (1+ pitting edema bilaterally).  Skin: Skin is warm and dry. Bruising (neck area) noted. No rash noted.  Psychiatric: She has a normal mood and affect.          Assessment & Plan:

## 2010-10-14 NOTE — Assessment & Plan Note (Addendum)
Going on 1 wk.  Mild abd pain associated with this. Nontoxic today, no fevers, seems well hydrated.  Ok for [employer]. In h/o etoh abuse, however reports not currently abusing. Some hepatomegaly and easy bruising.  ?hepatitis vs pancreatitis. Check blood work including lipase, LFTs, coags.  No recent blood work in last 4 years in chart. If elevated LFTs, check abd [place], consider viral hep panel. Shot of phenergan today. Encouraged push fluids and treat at home with zofran. Red flags to go to ER discussed (see pt instructions).

## 2010-10-14 NOTE — Patient Instructions (Signed)
Shot of phenergan for nausea today. We sent you home with script for zofran to take for nausea as needed. Push fluids as much as you can - water, gatorade, gingerale. We will check blood work today. If liver function is elevated, I recommend not drinking alcohol as this can worsen liver function even more. Return if not improving over time. To ER if any fever >101.5, worsening pain, or unable to keep any fluids down. Call us with questions.

## 2010-10-14 NOTE — Progress Notes (Signed)
Addended by: Annamarie Major on: 10/14/2010 06:28 PM   Modules accepted: Orders

## 2010-10-15 ENCOUNTER — Telehealth: Payer: Self-pay | Admitting: Family Medicine

## 2010-10-15 ENCOUNTER — Emergency Department: Payer: Self-pay | Admitting: Internal Medicine

## 2010-10-15 NOTE — Telephone Encounter (Signed)
Received call regarding acute lab values - platelets 95 and Na of 114.  In 1 week of nausea, vomitting, diarrhea, this certainly could happen (Na), and per Dr. Timoteo Expose notes yesterday, patient does not sound acutely ill.   Platelets of 95 explains bruising.  I have asked her add some extra salt today, drink gatorade, and recommend recheck on Monday and repeat labs.  Stephanie Raymond, can you help set up for Monday appt? If Dr. Milinda Antis is full, Dr. Reece Agar or I could see the patient.  Platelets -- would involve prescribing MD of Humira and cyclosporine, as well.

## 2010-10-17 ENCOUNTER — Telehealth: Payer: Self-pay | Admitting: *Deleted

## 2010-10-17 NOTE — Telephone Encounter (Signed)
SPOKE WITH PATIENT ADVISED HER TO STOP MEDICATION AND SHE MADE APPT WITH Dr. g for 10-18-2010 at 4:15

## 2010-10-17 NOTE — Telephone Encounter (Signed)
[facility] Triage Call Report Triage Record Num: [id number] Operator: Ether Griffins Patient Name: Stephanie Raymond Call Date & Time: 10/15/2010 8:01:56AM Patient Phone: [phone] PCP: Hannah Beat Patient Gender: Female PCP Fax : [phone] Patient DOB: December 31, 1955 Practice Name: [facility] Reason for Call: Hessie Diener from [facility] calling about Na 114, PLT 95--drawn 10/14/10. Ordered by Dr.Gutierrez.Contacted Dr.Copland and reported results.Called pt and left messages at both numbers--advised to take in more salt, drink Gatorade and call office if any problems arise per Dr. Patsy Lager.Callback number is [phone]. Protocol(s) Used: Office Note Recommended Outcome per Protocol: Information Noted and Sent to Office Reason for Outcome: Caller information to office Care Advice: ~ 10/15/2010 8:22:34AM Page 1 of 1 [id number]

## 2010-10-17 NOTE — Telephone Encounter (Addendum)
Reviewed blood work - also with elevated creatinine to 1.94.   Please have her stop cyclosporine and come in for office visit in next 1-2 days with PCP, if unavailable, with myself.

## 2010-10-17 NOTE — Telephone Encounter (Signed)
Left message for patient to return my call.

## 2010-10-18 ENCOUNTER — Encounter: Payer: Self-pay | Admitting: Family Medicine

## 2010-10-18 ENCOUNTER — Ambulatory Visit (INDEPENDENT_AMBULATORY_CARE_PROVIDER_SITE_OTHER): Payer: 59 | Admitting: Family Medicine

## 2010-10-18 DIAGNOSIS — R112 Nausea with vomiting, unspecified: Secondary | ICD-10-CM

## 2010-10-18 DIAGNOSIS — D696 Thrombocytopenia, unspecified: Secondary | ICD-10-CM

## 2010-10-18 DIAGNOSIS — N179 Acute kidney failure, unspecified: Secondary | ICD-10-CM

## 2010-10-18 DIAGNOSIS — E871 Hypo-osmolality and hyponatremia: Secondary | ICD-10-CM | POA: Insufficient documentation

## 2010-10-18 DIAGNOSIS — L408 Other psoriasis: Secondary | ICD-10-CM

## 2010-10-18 LAB — COMPREHENSIVE METABOLIC PANEL
AST: 34 U/L
Bilirubin, Indirect: 0.31
CO2: 18 mmol/L
Calcium: 9.1 mg/dL
Chloride: 82 mmol/L
Potassium: 5 mmol/L
Total Protein: 7.3 g/dL

## 2010-10-18 LAB — VITAMIN B12
B-12, serum: 1532
Folate: 3.8

## 2010-10-18 MED ORDER — PROMETHAZINE HCL 25 MG/ML IJ SOLN: 25.0000 mg | Freq: Once | INTRAMUSCULAR | Status: DC

## 2010-10-18 NOTE — Assessment & Plan Note (Signed)
New, along with mild coagulopathy and elevated Tbili, GGT, hyponatremia.  Albumin and AST/ALT normal. Concern for alcoholic hepatitis/cirrhosis given EtOH hx, however pt endorses just moderate EtOH use in last few years.   check abd [place]. Discussed concern for liver and advised stop EtOH use. Recheck blood work today.   Will obtain records from latest CBC by derm to compare.

## 2010-10-18 NOTE — Progress Notes (Signed)
Subjective:    Patient ID: Stephanie Raymond, female    DOB: [birth year], 55 y.o.   MRN: [id number]  HPI CC: 4d f/u  Pt seen last week with 7d h/o nausea/vomiting, not feeling well.  Blood work revealed hyponatremia with Na 114, ARF with Cr 1.94, and thrombocytopenia with platelets 94.  Lipase was normal, INR was slightly elevated at 1.3, and total bilirubin was elevated at 2.2.  GGT elevated at 340, but LFTs otherwise normal.  No previous blood work in system to compare.  Last visit treated with phenergan for nausea and sent home with zofran.  Spent Friday night in ER.  Was unable to lift either arms, was in severe pain.  Given meds and told to make f/u appointment with ortho.  No blood work was checked.  Went to [facility].  Will request copy of ER report.  Presents today for follow up.  Phenergan significantly helped nausea and vomiting.  Didn't need to take zofran.  States feeling overall better.  Denies abd pain, further nausea/vomiting.  EtOH - Endorses just 2 glasses wine daily, denies more. Occasional beer. H/o etoh abuse.  Previously drank more but several years ago.  Previously on humira and cyclosporine by derm for psoriasis, humira stopped when pt not feeling well, I advised pt stop cyclosporine when blood work returned with ARF.  Reviewed blood work in detail.  Medications and allergies reviewed and updated in chart.  Past histories reviewed and updated if relevant as below. Patient Active Problem List  Diagnoses  . HYPOTHYROIDISM  . ALCOHOL ABUSE  . TOBACCO USE  . DEPRESSION  . HYPERTENSION  . GERD  . PSORIASIS  . URTICARIA  . BACK PAIN, LUMBAR  . CARPAL TUNNEL SYNDROME, HX OF  . TRANSIENT ISCHEMIC ATTACK, HX OF  . INTERTRIGO, CANDIDAL  . ENLARGEMENT OF LYMPH NODES  . Nausea & vomiting   Past Medical History  Diagnosis Date  . Depression   . GERD (gastroesophageal reflux disease)   . Hypertension   . Hypothyroidism   . Hx of transient ischemic attack (TIA) 2000   Hosp ?  [facility], female   . History of CT scan 04/1998    small lacunar infarcts  . History of MRI of brain and brain stem 07/1998    white matter changes  . Psoriasis     on humira   Past Surgical History  Procedure Date  . Cervical conization w/bx 1980's  . Carotid ultrasound 04/1998    neg  . Foot surgery     toe treated, as a child  . Cervical cone biopsy 02/1999    microinvasive cervical carcinoma  . Partial hysterectomy 02/1999   History  Substance Use Topics  . Smoking status: Current Everyday Smoker  . Smokeless tobacco: Not on file  . Alcohol Use: Not on file   Family History  Problem Relation Age of Onset  . Emphysema Mother     smoker  . Heart disease Father 86    CABG  . Heart disease Other     MI in 1s  . Heart disease Other     CHF  . Hypertension Other    Allergies  Allergen Reactions  . Diphenhydramine Hcl     REACTION: feel funny   Current Outpatient Prescriptions on File Prior to Visit  Medication Sig Dispense Refill  . buPROPion (WELLBUTRIN SR) 150 MG 12 hr tablet Take 2 tablets (300 mg total) by mouth daily.  180 tablet  1  .  Cholecalciferol (VITAMIN D-3 PO) Take 1 capsule by mouth daily.        . cloNIDine (CATAPRES) 0.2 MG tablet Take 1 tablet (0.2 mg total) by mouth daily.  30 tablet  5  . cycloSPORINE modified (NEORAL/GENGRAF) 100 MG capsule Take 100 mg by mouth daily.        Marland Kitchen levothyroxine (SYNTHROID) 50 MCG tablet Take 1 tablet (50 mcg total) by mouth daily.  90 tablet  1  . losartan (COZAAR) 100 MG tablet Take 1 tablet (100 mg total) by mouth daily.  90 tablet  3  . omeprazole (PRILOSEC) 20 MG capsule Take 1 capsule (20 mg total) by mouth daily.  90 capsule  1  . ondansetron (ZOFRAN) 4 MG tablet Take 1 tablet (4 mg total) by mouth daily as needed for nausea.  30 tablet  0  . triamterene-hydrochlorothiazide (MAXZIDE-25) 37.5-25 MG per tablet Take 2 tablets by mouth daily.  180 tablet  3  . vitamin B-12 (CYANOCOBALAMIN) 500 MCG  tablet Take 500 mcg by mouth daily.        . Adalimumab (HUMIRA [place]) Inject into the skin. Every other week        Current Facility-Administered Medications on File Prior to Visit  Medication Dose Route Frequency Provider Last Rate Last Dose  . promethazine (PHENERGAN) injection 25 mg  25 mg Intramuscular Once Eustaquio Boyden, MD       Review of Systems Per HPI    Objective:   Physical Exam  Nursing note and vitals reviewed. Constitutional: She appears well-developed and well-nourished.       Mild tremor  HENT:  Head: Normocephalic and atraumatic.  Mouth/Throat: Oropharynx is clear and moist. No oropharyngeal exudate.  Eyes: Conjunctivae and EOM are normal. Pupils are equal, round, and reactive to light. No scleral icterus.  Neck: Normal range of motion. Neck supple.  Cardiovascular: Normal rate, regular rhythm, normal heart sounds and intact distal pulses.   No murmur heard. Pulmonary/Chest: Effort normal and breath sounds normal. No respiratory distress. She has no wheezes. She has no rales. She exhibits no tenderness.  Abdominal: Soft. Bowel sounds are normal. She exhibits distension. She exhibits no mass. There is no tenderness. There is no rebound and no guarding.       No fluid appreciated  Musculoskeletal: Normal range of motion. She exhibits no edema.       No significant pedal pitting edema  Lymphadenopathy:    She has no cervical adenopathy.  Skin: Skin is warm and dry. No rash noted.  Psychiatric: She has a normal mood and affect.          Assessment & Plan:

## 2010-10-18 NOTE — Patient Instructions (Addendum)
Slowly back off alcohol over the next week. Repeated blood work today. Hold cyclosporine and humira for now.  I will send copy of note to Dr. Cheree Ditto at [facility] in [place]. I would like to schedule ultrasound to check on liver and kidneys. Kidney function was worse than we would like it to be. Liver function seemed deteriorated some.  We need to ensure both these values improve. Pass by [facility]'s office to schedule ultrasound of abdomen to check on kidneys and liver.

## 2010-10-18 NOTE — Progress Notes (Signed)
Addended by: Eliezer Bottom on: 10/18/2010 06:10 PM   Modules accepted: Orders

## 2010-10-18 NOTE — Assessment & Plan Note (Signed)
Recommend hold both cyclosporine and humira for now.  May restart humira depending on derm recs.

## 2010-10-18 NOTE — Assessment & Plan Note (Signed)
Could have been caused by recent illness. rec stop cyclosporine for now. Will try and obtain latest blood work from derm to compare. Push fluids (not water).  If not improved, consider slowly back off maxzide or cozaar.

## 2010-10-18 NOTE — Assessment & Plan Note (Signed)
resolved 

## 2010-10-18 NOTE — Assessment & Plan Note (Signed)
Could have been due to nausea/vomiting, recent illness.  Advised abstain from pure water, may drink gatorade for fluid. Recheck today. Discussed if not slowly correcting, may need hospitalization for correction. ?liver disease.

## 2010-10-19 LAB — CBC AND DIFFERENTIAL
HCT: 31 %
Hemoglobin: 10.9 g/dL — AB (ref 12.0–16.0)
MCV: 98 fL
WBC: 11.6

## 2010-10-20 ENCOUNTER — Telehealth: Payer: Self-pay | Admitting: *Deleted

## 2010-10-20 DIAGNOSIS — R112 Nausea with vomiting, unspecified: Secondary | ICD-10-CM

## 2010-10-20 DIAGNOSIS — E871 Hypo-osmolality and hyponatremia: Secondary | ICD-10-CM

## 2010-10-20 DIAGNOSIS — N179 Acute kidney failure, unspecified: Secondary | ICD-10-CM

## 2010-10-20 NOTE — Telephone Encounter (Signed)
Sodium level improved from 114 last week.  Will await other blood work.

## 2010-10-20 NOTE — Telephone Encounter (Signed)
[facility] called with critical value on sodium- 119.  I verbally advised Dr. Sharen Hones of this result.

## 2010-10-20 NOTE — Telephone Encounter (Addendum)
Please ensure abd [place] has been scheduled. Please notify sodium improved 5 points to 119.  Platelets also improved.  However kidney function actually some worse.  Ensure drinking plenty of gatorade and **abstaining from EtOH. Continue to hold cyclosporine, cut cozaar in half (50mg  daily) Vit b12 and folate good range. If any abd pain or fever, will need to be seen either here or at ER as white count returned mildly elevated at 11.6. Would like her to return next week 9/5 for repeat bloodwork to trend sodium.  Please have her return to see PCP or myself 2-3 days after that.

## 2010-10-21 ENCOUNTER — Encounter: Payer: Self-pay | Admitting: Family Medicine

## 2010-10-21 NOTE — Telephone Encounter (Signed)
I requested them yesterday, so hopefully they will arrive soon.

## 2010-10-21 NOTE — Telephone Encounter (Signed)
Noted. Can we also obtain labwork done at derm's office (I want to get last Cr to compare to current values to see if acute or chronic renal insufficiency).

## 2010-10-21 NOTE — Telephone Encounter (Signed)
Abd/US set up at [facility] on 10/27/2010 at 8am patient notified and order faxed. MK

## 2010-10-21 NOTE — Telephone Encounter (Signed)
Spoke with patient and notified her of results. She said she is pushing fluids and refraining from alcohol. She is still holding the cyclosporine and will cut cozaar in half. Repeat labs and follow up scheduled. Advised to go to ER over the weekend if any abd pain due to elevated WBC's. She verbalized understanding. Patient was under the impression you wanted her to wait on the [place], so it hasn't been scheduled. I told her you wanted her to go ahead and have that done, so she should expect a call from [place].   Forwarded to [facility] to contact patient and schedule. Call patient at work.

## 2010-10-25 NOTE — Telephone Encounter (Signed)
I have been following notes and labs from Dr Reece Agar-- looks like he has ultrasound pending thanks

## 2010-10-25 NOTE — Telephone Encounter (Signed)
Dr. Milinda Antis, did you see this note from last week?

## 2010-10-26 ENCOUNTER — Other Ambulatory Visit: Payer: 59

## 2010-10-27 ENCOUNTER — Ambulatory Visit: Payer: Self-pay | Admitting: Family Medicine

## 2010-10-27 ENCOUNTER — Other Ambulatory Visit (INDEPENDENT_AMBULATORY_CARE_PROVIDER_SITE_OTHER): Payer: 59

## 2010-10-27 DIAGNOSIS — E039 Hypothyroidism, unspecified: Secondary | ICD-10-CM

## 2010-10-27 DIAGNOSIS — I10 Essential (primary) hypertension: Secondary | ICD-10-CM

## 2010-10-28 ENCOUNTER — Telehealth: Payer: Self-pay | Admitting: *Deleted

## 2010-10-28 ENCOUNTER — Ambulatory Visit: Payer: 59 | Admitting: Family Medicine

## 2010-10-28 DIAGNOSIS — E039 Hypothyroidism, unspecified: Secondary | ICD-10-CM

## 2010-10-28 DIAGNOSIS — E871 Hypo-osmolality and hyponatremia: Secondary | ICD-10-CM

## 2010-10-28 DIAGNOSIS — I10 Essential (primary) hypertension: Secondary | ICD-10-CM

## 2010-10-28 NOTE — Telephone Encounter (Signed)
Pt states you had wanted her to wean off clonidine.  She is asking how to do this.  She is taking one .2 mg daily.  Please advise. I told her you are out of the office until Tuesday. She is not taking her psoriasis meds anymore.

## 2010-10-30 NOTE — Telephone Encounter (Signed)
Got ahead and split the clonidine pill and just take 1/2 pill daily  Do that until f/u on 9/20-- will see how her bp is and make further plan from there Let me know if any problems of if she feels bad

## 2010-10-31 NOTE — Telephone Encounter (Signed)
Left vm for pt to callback 

## 2010-10-31 NOTE — Telephone Encounter (Signed)
Patient notified as instructed by telephone.Pt said she has been out of Clonidine since Fri but she will get rx filled locally (even though she will have to pay full price for med). Pt does not want to come and see Dr Sharen Hones on 11/10/10 because she knows what he is going to say and she does not want to hear it. Pt request rx for mail order prescription mailed to her home if Dr Milinda Antis will agree to do that. If not please let pt know how to proceed about an appt. Pt has no way of checking her BP and I did not cancel appt with Dr Sharen Hones until hear from Dr Milinda Antis. Pt can be reached at [phone]. Pt is aware Dr Milinda Antis will not be back in office until 11/01/10.

## 2010-10-31 NOTE — Telephone Encounter (Signed)
Then cancel appt with Dr Reece Agar and make appt with me for follow up when able if she wants to  She does have to have f/u of some kind to make sure bp/ labs come back to normal  Continue the lower dose clonidine until then - and hopefully if bp is good we will be able to decrease it further or stop it

## 2010-11-01 NOTE — Telephone Encounter (Signed)
Patient notified as instructed by telephone. Cancelled appt with Dr Sharen Hones and pt already has appt with Dr Milinda Antis in October. Dr Milinda Antis said that was OK unless pt were to develop N&V or need to be seen sooner and Dr Milinda Antis will order labs and I will call pt and schedule lab appt.

## 2010-11-01 NOTE — Telephone Encounter (Signed)
Left v/m for pt to call back to schedule lab test.

## 2010-11-01 NOTE — Telephone Encounter (Signed)
Left vm for pt to callback 

## 2010-11-01 NOTE — Telephone Encounter (Signed)
Patient notified as instructed by telephone. Lab appt scheduled as instructed 11/08/10 at 12:30pm.

## 2010-11-01 NOTE — Telephone Encounter (Signed)
I will order her future labs She needs to have these asap - so call her to schedule (these are things that are acute related to recent problems - not mt labs) Watching her sodium and kidney function - and need to make sure they return to normal

## 2010-11-02 ENCOUNTER — Telehealth: Payer: Self-pay

## 2010-11-02 NOTE — Telephone Encounter (Signed)
Dr. Milinda Antis got paper US abdomen report showing gallstones and increased size in liver and spleen but kidneys were OK. Dr Milinda Antis wanted me to ask pt if any abdominal pain. Left message at pt's home # and v/m at pt's work for pt to call back. (Dr Milinda Antis also wants Dr Sharen Hones to get copy of report and to be sent to be scanned. Will put copy of report in Dr Gutierrez's in box on his desk.

## 2010-11-03 NOTE — Telephone Encounter (Signed)
Pt left v/m for me to call pt back. Left message for pt to call back.

## 2010-11-07 NOTE — Telephone Encounter (Signed)
Patient notified as instructed by telephone. 

## 2010-11-07 NOTE — Telephone Encounter (Signed)
Thanks for the update - I do still want to get those labs  Watching to make sure anemia and platelets do not worsen and sodium continues to improve

## 2010-11-07 NOTE — Telephone Encounter (Signed)
Patient notified as instructed by telephone. Pt said she is not having abdominal pain and feels fine. Also f/u after [employer] report (report is on your shelf in the in box). Pt said has always bruised easy and bleeds freely if cut but not any worse ( no change at all). Pt wants to know if has to have lab scheduled 11/08/10 for comprehensive metabolic panel, cbc with diff, TSH, sodium and kidney function.Please advise.

## 2010-11-08 ENCOUNTER — Other Ambulatory Visit: Payer: 59

## 2010-11-10 ENCOUNTER — Ambulatory Visit: Payer: 59 | Admitting: Family Medicine

## 2010-11-14 ENCOUNTER — Other Ambulatory Visit (INDEPENDENT_AMBULATORY_CARE_PROVIDER_SITE_OTHER): Payer: 59

## 2010-11-14 DIAGNOSIS — E039 Hypothyroidism, unspecified: Secondary | ICD-10-CM

## 2010-11-14 DIAGNOSIS — I10 Essential (primary) hypertension: Secondary | ICD-10-CM

## 2010-11-14 DIAGNOSIS — E871 Hypo-osmolality and hyponatremia: Secondary | ICD-10-CM

## 2010-11-17 ENCOUNTER — Encounter: Payer: Self-pay | Admitting: Family Medicine

## 2010-11-30 ENCOUNTER — Ambulatory Visit (INDEPENDENT_AMBULATORY_CARE_PROVIDER_SITE_OTHER): Payer: 59 | Admitting: Family Medicine

## 2010-11-30 ENCOUNTER — Other Ambulatory Visit (HOSPITAL_COMMUNITY)
Admission: RE | Admit: 2010-11-30 | Discharge: 2010-11-30 | Disposition: A | Discharge status: Home / Self Care | Payer: 59 | Source: Ambulatory Visit | Attending: Family Medicine | Admitting: Family Medicine

## 2010-11-30 ENCOUNTER — Encounter: Payer: Self-pay | Admitting: Family Medicine

## 2010-11-30 DIAGNOSIS — Z1231 Encounter for screening mammogram for malignant neoplasm of breast: Secondary | ICD-10-CM

## 2010-11-30 DIAGNOSIS — L408 Other psoriasis: Secondary | ICD-10-CM

## 2010-11-30 DIAGNOSIS — E871 Hypo-osmolality and hyponatremia: Secondary | ICD-10-CM

## 2010-11-30 DIAGNOSIS — D696 Thrombocytopenia, unspecified: Secondary | ICD-10-CM

## 2010-11-30 DIAGNOSIS — F17200 Nicotine dependence, unspecified, uncomplicated: Secondary | ICD-10-CM

## 2010-11-30 DIAGNOSIS — Z1159 Encounter for screening for other viral diseases: Secondary | ICD-10-CM | POA: Insufficient documentation

## 2010-11-30 DIAGNOSIS — Z87891 Personal history of nicotine dependence: Secondary | ICD-10-CM

## 2010-11-30 DIAGNOSIS — N179 Acute kidney failure, unspecified: Secondary | ICD-10-CM

## 2010-11-30 DIAGNOSIS — Z0000 Encounter for general adult medical examination without abnormal findings: Secondary | ICD-10-CM

## 2010-11-30 DIAGNOSIS — E039 Hypothyroidism, unspecified: Secondary | ICD-10-CM

## 2010-11-30 DIAGNOSIS — I10 Essential (primary) hypertension: Secondary | ICD-10-CM

## 2010-11-30 DIAGNOSIS — F1010 Alcohol abuse, uncomplicated: Secondary | ICD-10-CM

## 2010-11-30 DIAGNOSIS — Z01419 Encounter for gynecological examination (general) (routine) without abnormal findings: Secondary | ICD-10-CM | POA: Insufficient documentation

## 2010-11-30 MED ORDER — OMEPRAZOLE 20 MG PO CPDR: 20.0000 mg | DELAYED_RELEASE_CAPSULE | Freq: Every day | ORAL | Status: DC

## 2010-11-30 MED ORDER — TRIAMTERENE-HCTZ 37.5-25 MG PO TABS: 1.0000 | ORAL_TABLET | Freq: Every day | ORAL | Status: DC

## 2010-11-30 MED ORDER — VITAMIN B-12 500 MCG PO TABS: 500.0000 ug | ORAL_TABLET | Freq: Every day | ORAL | Status: DC

## 2010-11-30 MED ORDER — LEVOTHYROXINE SODIUM 50 MCG PO TABS: 50.0000 ug | ORAL_TABLET | Freq: Every day | ORAL | Status: DC

## 2010-11-30 MED ORDER — BUPROPION HCL ER (SR) 150 MG PO TB12: 300.0000 mg | ORAL_TABLET | Freq: Every day | ORAL | Status: DC

## 2010-11-30 MED ORDER — CLONIDINE HCL 0.2 MG PO TABS: 0.1000 mg | ORAL_TABLET | Freq: Every day | ORAL | Status: DC

## 2010-11-30 MED ORDER — LOSARTAN POTASSIUM 100 MG PO TABS: 50.0000 mg | ORAL_TABLET | Freq: Every day | ORAL | Status: DC

## 2010-11-30 NOTE — Progress Notes (Signed)
Subjective:    Patient ID: Stephanie Raymond, female    DOB: [birth year], 55 y.o.   MRN: [id number]  HPI Here for health mt exam today as well as acute problems (anemia / hyponatremia/ thrombocytopenia) , and chronic med issues as well   Is drinking some gatorade during the day   She has only been off the cyclosporine for a day did not stop it  It did clear up her psoriasis   Her R ankle is swollen   HTN in good control 128/84-- she did not cut the losartan - due to not knowing how to split it  As usual pulse high due to anx (? Alcohol w/d)  Wt is down 2 lb with bmi of 35  Part hyst in past - cervical dysplasia Is not seeing gyn - has been over a year since last pap  Will do pap today   Mam 11/10- wants to set that up  Self exam  Td 03 Flu  Was on cyclosporine for her psoriasis -just finished  Also took lots of ibuprofen for leg wound this year -- ? Rel to high cr ? If this caused abn cbc/ vomiting/ and electrolyte abn Pt has had labs at [facility] - and she has been very hard to reach  Sees ortho tomorrow for problems with arms and hands and legs - pain and difficulty moving at times  Did have bursitis in her arm - and a shot helped   Last labs hb 10.7, with platelet 114 Cr2.0 and na 122 with nl bun  Stopped drinkinf as of Sunday - mostly having trouble sleeping at night , no more shakes than usual  Was having 2 glasses of wine per day  Quit smoking sept 5th -- is very proud of that - and it was very difficult   She is still on humera   Patient Active Problem List  Diagnoses  . HYPOTHYROIDISM  . ALCOHOL ABUSE  . TOBACCO USE  . DEPRESSION  . HYPERTENSION  . GERD  . PSORIASIS  . URTICARIA  . BACK PAIN, LUMBAR  . CARPAL TUNNEL SYNDROME, HX OF  . TRANSIENT ISCHEMIC ATTACK, HX OF  . INTERTRIGO, CANDIDAL  . ENLARGEMENT OF LYMPH NODES  . Nausea & vomiting  . Hyponatremia  . ARF (acute renal failure)  . Thrombocytopenia  . Routine general medical examination at a  health care facility  . Gynecological examination  . Other screening mammogram   Past Medical History  Diagnosis Date  . Depression   . GERD (gastroesophageal reflux disease)   . Hypertension   . Hypothyroidism   . Hx of transient ischemic attack (TIA) 2000    Hosp ?  [facility]   . History of CT scan 04/1998    small lacunar infarcts  . History of MRI of brain and brain stem 07/1998    white matter changes  . Psoriasis     on humira   Past Surgical History  Procedure Date  . Cervical conization w/bx 1980's  . Carotid ultrasound 04/1998    neg  . Foot surgery     toe treated, as a child  . Cervical cone biopsy 02/1999    microinvasive cervical carcinoma  . Partial hysterectomy 02/1999   History  Substance Use Topics  . Smoking status: Former Smoker    Quit date: 11/03/2010  . Smokeless tobacco: Not on file  . Alcohol Use: Not on file   Family History  Problem Relation Age of  Onset  . Emphysema Mother     smoker  . Heart disease Father 22    CABG  . Heart disease Other     MI in 30s  . Heart disease Other     CHF  . Hypertension Other    Allergies  Allergen Reactions  . Diphenhydramine Hcl     REACTION: feel funny   Current Outpatient Prescriptions on File Prior to Visit  Medication Sig Dispense Refill  . Adalimumab (HUMIRA [place]) Inject into the skin. Every other week       . Cholecalciferol (VITAMIN D-3 PO) Take 1 capsule by mouth daily.        . ondansetron (ZOFRAN) 4 MG tablet Take 1 tablet (4 mg total) by mouth daily as needed for nausea.  30 tablet  0   Current Facility-Administered Medications on File Prior to Visit  Medication Dose Route Frequency Provider Last Rate Last Dose  . promethazine (PHENERGAN) injection 25 mg  25 mg Intramuscular Once Eustaquio Boyden, MD         Review of Systems Review of Systems  Constitutional: Negative for fever, appetite change, and unexpected weight change. some fatigue and alcohol craving that is  improving  Eyes: Negative for pain and visual disturbance.  Respiratory: Negative for cough and shortness of breath.  some cigarette craving that is improving  Cardiovascular: Negative for cp or palpitations    Gastrointestinal: Negative for nausea, diarrhea and constipation.  Genitourinary: Negative for urgency and frequency.  Skin: Negative for pallor and pos for psoriasis that is improved  MSK various joint aches and pains without swelling  Neurological: Negative for weakness, light-headedness, numbness and headaches.  Hematological: Negative for adenopathy. Does not bruise/bleed easily.  Psychiatric/Behavioral: Negative for dysphoric mood. The patient is not nervous/anxious.          Objective:   Physical Exam  Constitutional: She is oriented to person, place, and time. She appears well-developed and well-nourished. No distress.       overwt and anxious appearing   HENT:  Head: Normocephalic and atraumatic.  Right Ear: External ear normal.  Left Ear: External ear normal.  Nose: Nose normal.  Mouth/Throat: Oropharynx is clear and moist.  Eyes: Conjunctivae and EOM are normal. Pupils are equal, round, and reactive to light. Right eye exhibits no discharge. Left eye exhibits no discharge. No scleral icterus.  Neck: Normal range of motion. Neck supple. No JVD present. Carotid bruit is not present. No thyromegaly present.  Cardiovascular: Normal rate, regular rhythm, normal heart sounds and intact distal pulses.  Exam reveals no gallop.   Pulmonary/Chest: Effort normal and breath sounds normal. No respiratory distress. She has no wheezes. She exhibits no tenderness.       Diffusely distant bs   Abdominal: Soft. Bowel sounds are normal. She exhibits no distension and no mass. There is no tenderness.  Genitourinary: Vagina normal. No breast swelling, tenderness, discharge or bleeding. No vaginal discharge found.       S/p partial hyst-pap obt from cervical cuff Nl bimanual exam    Musculoskeletal: Normal range of motion. She exhibits tenderness. She exhibits no edema.  Lymphadenopathy:    She has no cervical adenopathy.  Neurological: She is alert and oriented to person, place, and time. She has normal reflexes. No cranial nerve deficit. She exhibits normal muscle tone. Coordination normal.       Baseline tremor in hands and head Less sweating and flushing tody  Skin: Skin is warm and dry. No rash  noted. No erythema. No pallor.  Psychiatric:       Nervous overall but pleasant and conversative with good eye contact           Assessment & Plan:

## 2010-11-30 NOTE — Patient Instructions (Signed)
Cut your losartan to 1/2 pill Cut fluid pills to 1 (triamterene)  instead of 2 I will mail your px No more cyclosporin Get labs [facility] - [facility] and cbc with diff  Follow up with me when you come back from vacation  We will schedule your mammogram at check out  Stay away from alcohol and cigarettes- good job!!

## 2010-12-01 DIAGNOSIS — F17200 Nicotine dependence, unspecified, uncomplicated: Secondary | ICD-10-CM | POA: Insufficient documentation

## 2010-12-01 NOTE — Assessment & Plan Note (Signed)
Per pt stopped drinking on Sunday and doing ok  No seizures or severe w/d symptoms Tremor persists  Glad she quit

## 2010-12-01 NOTE — Assessment & Plan Note (Signed)
Commended on quitting and enc to continue

## 2010-12-01 NOTE — Assessment & Plan Note (Signed)
This began after period of vomiting but could be multifactorial with cyclosporine and diuretic use along with renal insuff and alcohol abuse Will re check 1 week off meds and alcohol No symptoms

## 2010-12-01 NOTE — Assessment & Plan Note (Signed)
Unsure if cyclosporine started some of her lab abnormalities  Is off it now  Send labs to her derm Also on humera  Lab in 1 week - some worry over anemia and low platelets  bp is normalizing

## 2010-12-01 NOTE — Assessment & Plan Note (Signed)
Could be med or alcohol related - on cyclosporine and humera  Re check 1 week Also anemia  Is no longer drinking -re assuring

## 2010-12-01 NOTE — Assessment & Plan Note (Signed)
Reviewed health habits including diet and exercise and skin cancer prevention Also reviewed health mt list, fam hx and immunizations  Rev labs - various abnormalities Re assuring she quit drinking and smoking  Gyn exam today  She will see if ok to get flu shot on current meds- suspect she is

## 2010-12-01 NOTE — Assessment & Plan Note (Signed)
Pap s/p partial hyst for cervical dysplasia Doing well and no c/o

## 2010-12-01 NOTE — Assessment & Plan Note (Signed)
This is stable with tx tsh - no problems

## 2010-12-01 NOTE — Assessment & Plan Note (Signed)
Well controlled and imp off the cyclospirine Will cut losartan in 1/2 due to her inc cr  Continue low dose clonidine for now  Lab 1 wk

## 2010-12-01 NOTE — Assessment & Plan Note (Signed)
Mam planned and overdue for screening Nl exam  Enc regular self exams

## 2010-12-01 NOTE — Assessment & Plan Note (Signed)
Could be multifactorial with recent dehydration/ alcohol abuse/ cyclospirine/ HTN and overuse of ibuprofen in the past  Will re check in 1 week off those meds and alcohol If not imp will ref to renal  F/u planned Lab order for [facility] given

## 2010-12-07 ENCOUNTER — Encounter: Payer: Self-pay | Admitting: Family Medicine

## 2010-12-07 ENCOUNTER — Ambulatory Visit (INDEPENDENT_AMBULATORY_CARE_PROVIDER_SITE_OTHER): Payer: 59 | Admitting: Family Medicine

## 2010-12-07 VITALS — BP 100/70 | HR 104 | Temp 98.2°F | Ht 63.0 in | Wt 202.2 lb

## 2010-12-07 DIAGNOSIS — J029 Acute pharyngitis, unspecified: Secondary | ICD-10-CM | POA: Insufficient documentation

## 2010-12-07 LAB — POCT RAPID STREP A (OFFICE): Rapid Strep A Screen: NEGATIVE

## 2010-12-07 NOTE — Assessment & Plan Note (Signed)
With rapid strep neg and B9 exam in a former smoker  Suspect early viral cold or pharyngitis Disc sympt care - see inst  Update if worse or inc fever or not improving in a week  Commended again on smoking cessation

## 2010-12-07 NOTE — Progress Notes (Signed)
Subjective:    Patient ID: Stephanie Raymond, female    DOB: 1955/11/05, 55 y.o.   MRN: [id number]  HPI Here for ST Rapid strep test neg  Started ST late this afternoon - really bad last night  No drainage   No sniffling Sneezes all the time Ears feel plugged up  Had 9.2 fever at 2am -- but was gone this am 98.?   No coughing -nothing new   Getting labs drawn tomorrow - will send to [place]  Going on vacation the 29th  Otherwise feeling ok   Patient Active Problem List  Diagnoses  . HYPOTHYROIDISM  . History of alcohol abuse  . DEPRESSION  . HYPERTENSION  . GERD  . PSORIASIS  . BACK PAIN, LUMBAR  . CARPAL TUNNEL SYNDROME, HX OF  . TRANSIENT ISCHEMIC ATTACK, HX OF  . INTERTRIGO, CANDIDAL  . ENLARGEMENT OF LYMPH NODES  . Hyponatremia  . ARF (acute renal failure)  . Thrombocytopenia  . Routine general medical examination at a health care facility  . Gynecological examination  . Other screening mammogram  . Former smoker  . Sore throat   Past Medical History  Diagnosis Date  . Depression   . GERD (gastroesophageal reflux disease)   . Hypertension   . Hypothyroidism   . Hx of transient ischemic attack (TIA) 2000    Hosp ?  [facility]   . History of CT scan 04/1998    small lacunar infarcts  . History of MRI of brain and brain stem 07/1998    white matter changes  . Psoriasis     on humira   Past Surgical History  Procedure Date  . Cervical conization w/bx 1980's  . Carotid ultrasound 04/1998    neg  . Foot surgery     toe treated, as a child  . Cervical cone biopsy 02/1999    microinvasive cervical carcinoma  . Partial hysterectomy 02/1999   History  Substance Use Topics  . Smoking status: Former Smoker    Quit date: 11/03/2010  . Smokeless tobacco: Not on file  . Alcohol Use: Not on file   Family History  Problem Relation Age of Onset  . Emphysema Mother     smoker  . Heart disease Father 32    CABG  . Heart disease Other     MI  in 62s  . Heart disease Other     CHF  . Hypertension Other    Allergies  Allergen Reactions  . Diphenhydramine Hcl     REACTION: feel funny   Current Outpatient Prescriptions on File Prior to Visit  Medication Sig Dispense Refill  . Adalimumab (HUMIRA [place]) Inject into the skin. Every other week       . buPROPion (WELLBUTRIN SR) 150 MG 12 hr tablet Take 2 tablets (300 mg total) by mouth daily.  180 tablet  3  . Cholecalciferol (VITAMIN D-3 PO) Take 1 capsule by mouth daily.        . cloNIDine (CATAPRES) 0.2 MG tablet Take 0.5 tablets (0.1 mg total) by mouth daily.  45 tablet  3  . levothyroxine (SYNTHROID) 50 MCG tablet Take 1 tablet (50 mcg total) by mouth daily.  90 tablet  3  . losartan (COZAAR) 100 MG tablet Take 0.5 tablets (50 mg total) by mouth daily.  45 tablet  3  . omeprazole (PRILOSEC) 20 MG capsule Take 1 capsule (20 mg total) by mouth daily.  90 capsule  3  . triamterene-hydrochlorothiazide (  MAXZIDE-25) 37.5-25 MG per tablet Take 1 each (1 tablet total) by mouth daily.  90 tablet  3  . vitamin B-12 (CYANOCOBALAMIN) 500 MCG tablet Take 1 tablet (500 mcg total) by mouth daily.  90 tablet  3  . ondansetron (ZOFRAN) 4 MG tablet Take 1 tablet (4 mg total) by mouth daily as needed for nausea.  30 tablet  0   Current Facility-Administered Medications on File Prior to Visit  Medication Dose Route Frequency Provider Last Rate Last Dose  . promethazine (PHENERGAN) injection 25 mg  25 mg Intramuscular Once Eustaquio Boyden, MD          Review of Systems Review of Systems  Constitutional: Negative for, appetite change, fatigue and unexpected weight change. pos for low grade fever last night Eyes: Negative for pain and visual disturbance.  ENT pos for st without swelling or swallowing problems, no drainage/ sinus pain or ear pain  Respiratory: Negative for cough and shortness of breath.   Cardiovascular: Negative for cp or palpitations    Gastrointestinal: Negative for nausea,  diarrhea and constipation.  Genitourinary: Negative for urgency and frequency.  Skin: Negative for pallor , pos for rash of chronic psoriasis MSK pos for cramp in leg last night- sore today Neurological: Negative for weakness, light-headedness, numbness and headaches.  Hematological: Negative for adenopathy. Does not bruise/bleed easily.  Psychiatric/Behavioral: Negative for dysphoric mood. The patient is not nervous/anxious.          Objective:   Physical Exam  Constitutional: She appears well-developed and well-nourished. No distress.       overwt and well appearing   HENT:  Head: Normocephalic and atraumatic.  Right Ear: External ear normal.  Left Ear: External ear normal.       Boggy nares with some mild clear post nasal drip  No erythema/ swelling or ulcers in throat   Eyes: Conjunctivae and EOM are normal. Pupils are equal, round, and reactive to light. Right eye exhibits no discharge. Left eye exhibits no discharge.  Neck: Normal range of motion. Neck supple. No JVD present. No thyromegaly present.  Cardiovascular: Normal rate, regular rhythm and normal heart sounds.   Pulmonary/Chest: Effort normal and breath sounds normal. No respiratory distress. She has no wheezes.  Musculoskeletal: She exhibits no tenderness.  Lymphadenopathy:    She has no cervical adenopathy.  Skin: Skin is warm and dry. No rash noted. No erythema. No pallor.  Psychiatric: She has a normal mood and affect.          Assessment & Plan:

## 2010-12-07 NOTE — Patient Instructions (Signed)
Rapid strep test today is negative Throat is not red  I think you are coming down with a virus- possibly a cold- that will have to run it's course  Drink lots of fluids  You can gargle with salt water /warm  Also chloraseptic spray for throat Also tylenol up to every 4 hours  Rest when you can No nsaids (that is motrin/ibuprofen/ advil or aleve)- since these are bad for the kidney  I will get in touch as soon as I get your labs

## 2010-12-09 ENCOUNTER — Encounter: Payer: Self-pay | Admitting: Family Medicine

## 2010-12-13 ENCOUNTER — Encounter: Payer: Self-pay | Admitting: *Deleted

## 2010-12-16 ENCOUNTER — Emergency Department: Payer: Self-pay | Admitting: *Deleted

## 2011-01-09 ENCOUNTER — Encounter: Payer: Self-pay | Admitting: Nurse Practitioner

## 2011-01-09 ENCOUNTER — Encounter: Payer: Self-pay | Admitting: Cardiothoracic Surgery

## 2011-01-21 ENCOUNTER — Encounter: Payer: Self-pay | Admitting: Nurse Practitioner

## 2011-01-21 ENCOUNTER — Encounter: Payer: Self-pay | Admitting: Cardiothoracic Surgery

## 2011-01-24 ENCOUNTER — Ambulatory Visit: Payer: Self-pay | Admitting: Family Medicine

## 2011-01-26 ENCOUNTER — Encounter: Payer: Self-pay | Admitting: Family Medicine

## 2011-01-27 ENCOUNTER — Encounter: Payer: Self-pay | Admitting: *Deleted

## 2011-02-21 ENCOUNTER — Encounter: Payer: Self-pay | Admitting: Nurse Practitioner

## 2011-02-21 ENCOUNTER — Encounter: Payer: Self-pay | Admitting: Cardiothoracic Surgery

## 2011-02-28 ENCOUNTER — Encounter: Payer: Self-pay | Admitting: Family Medicine

## 2011-02-28 ENCOUNTER — Ambulatory Visit (INDEPENDENT_AMBULATORY_CARE_PROVIDER_SITE_OTHER): Payer: 59 | Admitting: Family Medicine

## 2011-02-28 DIAGNOSIS — N289 Disorder of kidney and ureter, unspecified: Secondary | ICD-10-CM

## 2011-02-28 DIAGNOSIS — K649 Unspecified hemorrhoids: Secondary | ICD-10-CM | POA: Insufficient documentation

## 2011-02-28 DIAGNOSIS — E871 Hypo-osmolality and hyponatremia: Secondary | ICD-10-CM

## 2011-02-28 DIAGNOSIS — F1011 Alcohol abuse, in remission: Secondary | ICD-10-CM

## 2011-02-28 DIAGNOSIS — D696 Thrombocytopenia, unspecified: Secondary | ICD-10-CM

## 2011-02-28 DIAGNOSIS — D649 Anemia, unspecified: Secondary | ICD-10-CM | POA: Insufficient documentation

## 2011-02-28 MED ORDER — HYDROCORTISONE ACETATE 25 MG RE SUPP: 25.0000 mg | Freq: Every day | RECTAL | Status: AC

## 2011-02-28 NOTE — Progress Notes (Signed)
Subjective:    Patient ID: Stephanie Raymond, female    DOB: 11/16/1955, 56 y.o.   MRN: [id number]  HPI Here for constipation and bright red blood in stool (since Sunday 1/6) Sunday night and last night - not a lot , but in toilet- streaks and also on toilet tissue Has a hx of hemorroids  Some problems with constipation and straining - since early nov Some straining  Got some otc med -- was a stool softener-- (not a laxative )  Right now has a bm every 1-2 days  No rectal pain  No abd pain or cramping  No itch or burn externally   Wt is down 14 lb Has not had an appetite lately - perhaps stress ? - unsure why Tries to eat at least once per day   Is no longer on the humera-had some side effects   Due for re check on low platelets/anemia and sodium level   Pt states "she is doing ok with alcohol" -- but does try to dodge the topic and change the subject quickly   Patient Active Problem List  Diagnoses  . HYPOTHYROIDISM  . History of alcohol abuse  . DEPRESSION  . HYPERTENSION  . GERD  . PSORIASIS  . BACK PAIN, LUMBAR  . CARPAL TUNNEL SYNDROME, HX OF  . TRANSIENT ISCHEMIC ATTACK, HX OF  . INTERTRIGO, CANDIDAL  . ENLARGEMENT OF LYMPH NODES  . Hyponatremia  . ARF (acute renal failure)  . Thrombocytopenia  . Routine general medical examination at a health care facility  . Gynecological examination  . Other screening mammogram  . Former smoker  . Sore throat  . Renal insufficiency  . Anemia  . Hemorrhoids   Past Medical History  Diagnosis Date  . Depression   . GERD (gastroesophageal reflux disease)   . Hypertension   . Hypothyroidism   . Hx of transient ischemic attack (TIA) 2000    Hosp ?  [facility]   . History of CT scan 04/1998    small lacunar infarcts  . History of MRI of brain and brain stem 07/1998    white matter changes  . Psoriasis     on humira   Past Surgical History  Procedure Date  . Cervical conization w/bx 1980's  . Carotid  ultrasound 04/1998    neg  . Foot surgery     toe treated, as a child  . Cervical cone biopsy 02/1999    microinvasive cervical carcinoma  . Partial hysterectomy 02/1999   History  Substance Use Topics  . Smoking status: Former Smoker    Quit date: 11/03/2010  . Smokeless tobacco: Not on file  . Alcohol Use: Not on file   Family History  Problem Relation Age of Onset  . Emphysema Mother     smoker  . Heart disease Father 70    CABG  . Heart disease Other     MI in 72s  . Heart disease Other     CHF  . Hypertension Other    Allergies  Allergen Reactions  . Diphenhydramine Hcl     REACTION: feel funny  . Humira Other (See Comments)    Could not walk.   Current Outpatient Prescriptions on File Prior to Visit  Medication Sig Dispense Refill  . Adalimumab (HUMIRA [place]) Inject into the skin. Every other week       . buPROPion (WELLBUTRIN SR) 150 MG 12 hr tablet Take 2 tablets (300 mg total) by mouth  daily.  180 tablet  3  . Cholecalciferol (VITAMIN D-3 PO) Take 1 capsule by mouth daily.        [facility] levothyroxine (SYNTHROID) 50 MCG tablet Take 1 tablet (50 mcg total) by mouth daily.  90 tablet  3  . losartan (COZAAR) 100 MG tablet Take 0.5 tablets (50 mg total) by mouth daily.  45 tablet  3  . omeprazole (PRILOSEC) 20 MG capsule Take 1 capsule (20 mg total) by mouth daily.  90 capsule  3  . triamterene-hydrochlorothiazide (MAXZIDE-25) 37.5-25 MG per tablet Take 1 each (1 tablet total) by mouth daily.  90 tablet  3  . cloNIDine (CATAPRES) 0.2 MG tablet Take 0.5 tablets (0.1 mg total) by mouth daily.  45 tablet  3  . ondansetron (ZOFRAN) 4 MG tablet Take 1 tablet (4 mg total) by mouth daily as needed for nausea.  30 tablet  0  . vitamin B-12 (CYANOCOBALAMIN) 500 MCG tablet Take 1 tablet (500 mcg total) by mouth daily.  90 tablet  3   Current Facility-Administered Medications on File Prior to Visit  Medication Dose Route Frequency Provider Last Rate Last Dose  . promethazine  (PHENERGAN) injection 25 mg  25 mg Intramuscular Once Eustaquio Boyden, MD           Review of Systems Review of Systems  Constitutional: Negative for fever, appetite change,  and pos for fatigue and wt loss  Eyes: Negative for pain and visual disturbance.  Respiratory: Negative for sob / wheeze, has a little cough from a recent cold   Cardiovascular: Negative for cp or palpitations    Gastrointestinal: Negative for nausea, diarrhea and constipation. neg for dark stools or rectal pain Genitourinary: Negative for urgency and frequency.  Skin: Negative for pallor or rash   Neurological: Negative for weakness, light-headedness, numbness and headaches.  Hematological: Negative for adenopathy. Does not bruise/bleed easily.  Psychiatric/Behavioral: Negative for dysphoric mood. The patient is generally anxious and very stressed (much situational stress).          Objective:   Physical Exam  Constitutional: She appears well-developed and well-nourished. No distress.       overwt and anxious appearing today  HENT:  Head: Normocephalic and atraumatic.  Mouth/Throat: Oropharynx is clear and moist.  Eyes: Conjunctivae and EOM are normal. Pupils are equal, round, and reactive to light. No scleral icterus.  Neck: Normal range of motion. Neck supple. No JVD present. Carotid bruit is not present.  Cardiovascular: Normal rate, regular rhythm and normal heart sounds.   Pulmonary/Chest: Effort normal and breath sounds normal. No respiratory distress.       Diffusely distant bs   Abdominal: Soft. Bowel sounds are normal. She exhibits no distension, no abdominal bruit, no ascites and no mass. There is no tenderness.       No HSM noted   Genitourinary: Rectal exam shows internal hemorrhoid. Rectal exam shows no external hemorrhoid, no fissure, no mass, no tenderness and anal tone normal. Guaiac negative stool.       Anoscopy- internal hemorrhoid that is not clotted or actively bleeding at approx 5:00  with some generalized inflammation resembling proctitis   Musculoskeletal: She exhibits no edema.  Lymphadenopathy:    She has no cervical adenopathy.  Neurological: She is alert. She has normal reflexes. She displays tremor.  Skin: Skin is warm and dry. No rash noted. No erythema. No pallor.       Sweats easily  Psychiatric:       Seems  generally anxious and stressed  Does not want to talk about chronic med problems or noncompliance with recommendations  Does not seem to be intoxicated but wonder if she could be in withdrawl          Assessment & Plan:

## 2011-02-28 NOTE — Patient Instructions (Addendum)
Use the anusol HC suppository for hemorrhoids  Keep stools soft- and try to avoid straining  If bleeding does not stop within the next week - please update me Labs today  We may consider GI referral to discuss colonoscopy after labs return  Please go get labs done please asap  Try to eat regular small meals when able

## 2011-03-02 NOTE — Assessment & Plan Note (Signed)
May be multifactorial  ? If chronic dehydration from alcohol?  She declines labs today- see other assessments Non compliant and I worry she will not get labs tomorrow as asked Did strongly emph importance of this

## 2011-03-02 NOTE — Assessment & Plan Note (Signed)
This needs to be re checked - ? Cause - poss chronic dz/ med side eff/ alcoholism - also mentioned she needs colonosc  Offered to draw now and send to [facility] -pt declined and asked for order to be sent to drawing center - since she had a meeting I have some doubt she will do this

## 2011-03-02 NOTE — Assessment & Plan Note (Signed)
Bleeding int hemorrhoid with constipation Disc using stool softener and avoid straining Given anusol hc suppos to use for 10 d and update Pt also needs colonosc for screen and also anemia- she is aware and does not want to set up yet

## 2011-03-02 NOTE — Assessment & Plan Note (Signed)
See assessment for anemia  May be alcohol related  Does bruise easily  Pt declines draw today - see prev assessment  I again stressed imp of this esp in light of rectal bleeding

## 2011-03-02 NOTE — Assessment & Plan Note (Signed)
Pt declines labs - asks for order for drawing station  (was noncompliant with last order) I emph imp of checking because low na can lead to seizure Unfortunately I doubt she will comply  Stressed strongly imp of getting lab tomorrow

## 2011-03-02 NOTE — Assessment & Plan Note (Signed)
I suspect given behavior she is drinking again - pt denies this but quickly changes the subject I do not feel she is ready to address problem and little can be done until she is

## 2011-03-24 ENCOUNTER — Encounter: Payer: Self-pay | Admitting: Cardiothoracic Surgery

## 2011-03-24 ENCOUNTER — Encounter: Payer: Self-pay | Admitting: Nurse Practitioner

## 2011-04-21 ENCOUNTER — Encounter: Payer: Self-pay | Admitting: Cardiothoracic Surgery

## 2011-04-21 ENCOUNTER — Encounter: Payer: Self-pay | Admitting: Nurse Practitioner

## 2011-05-13 ENCOUNTER — Ambulatory Visit: Payer: Self-pay | Admitting: Internal Medicine

## 2011-05-18 ENCOUNTER — Telehealth: Payer: Self-pay | Admitting: Family Medicine

## 2011-05-18 DIAGNOSIS — Z8669 Personal history of other diseases of the nervous system and sense organs: Secondary | ICD-10-CM

## 2011-05-18 NOTE — Telephone Encounter (Signed)
Patient has had carpel tunnel for many years.  It has gotten worse recently.  Patient wants to know if she can be referred to a hand specialist to find out if she will need surgery.  Patient will go to [place] or [place].  Patient was told Dr.Tower is out of the office until Monday and she said it was fine to wait until Monday.

## 2011-05-18 NOTE — Telephone Encounter (Signed)
Here is ref for carpal tunnel

## 2011-05-22 ENCOUNTER — Ambulatory Visit: Payer: Self-pay | Admitting: Oncology

## 2011-05-24 ENCOUNTER — Telehealth: Payer: Self-pay | Admitting: Family Medicine

## 2011-05-24 ENCOUNTER — Encounter: Payer: Self-pay | Admitting: Family Medicine

## 2011-05-24 ENCOUNTER — Ambulatory Visit (INDEPENDENT_AMBULATORY_CARE_PROVIDER_SITE_OTHER): Payer: 59 | Admitting: Family Medicine

## 2011-05-24 VITALS — BP 112/74 | HR 68 | Temp 97.4°F | Ht 63.0 in | Wt 189.0 lb

## 2011-05-24 DIAGNOSIS — R5383 Other fatigue: Secondary | ICD-10-CM

## 2011-05-24 DIAGNOSIS — R5381 Other malaise: Secondary | ICD-10-CM

## 2011-05-24 DIAGNOSIS — K529 Noninfective gastroenteritis and colitis, unspecified: Secondary | ICD-10-CM | POA: Insufficient documentation

## 2011-05-24 DIAGNOSIS — R531 Weakness: Secondary | ICD-10-CM | POA: Insufficient documentation

## 2011-05-24 DIAGNOSIS — F1011 Alcohol abuse, in remission: Secondary | ICD-10-CM

## 2011-05-24 DIAGNOSIS — K5289 Other specified noninfective gastroenteritis and colitis: Secondary | ICD-10-CM

## 2011-05-24 DIAGNOSIS — D696 Thrombocytopenia, unspecified: Secondary | ICD-10-CM

## 2011-05-24 DIAGNOSIS — E871 Hypo-osmolality and hyponatremia: Secondary | ICD-10-CM

## 2011-05-24 DIAGNOSIS — N289 Disorder of kidney and ureter, unspecified: Secondary | ICD-10-CM

## 2011-05-24 DIAGNOSIS — E039 Hypothyroidism, unspecified: Secondary | ICD-10-CM

## 2011-05-24 DIAGNOSIS — D649 Anemia, unspecified: Secondary | ICD-10-CM

## 2011-05-24 NOTE — Assessment & Plan Note (Signed)
Re check cbc today- pt has been non compliant with draws

## 2011-05-24 NOTE — Assessment & Plan Note (Signed)
Lab today Pt non compliant about draws - had long disc about this  Has had diarrhea - so may be low again

## 2011-05-24 NOTE — Telephone Encounter (Signed)
Patient saw Dr. Tower today.  

## 2011-05-24 NOTE — Assessment & Plan Note (Signed)
Cbc today Pt is alcoholic  Noncompliant with draws  Will update

## 2011-05-24 NOTE — Assessment & Plan Note (Signed)
tsh today

## 2011-05-24 NOTE — Telephone Encounter (Signed)
Caller: Nayara/Patient; PCP: Tower, Marne A.; CB#: [phone]; Call regarding "Stomach Bug;"  Reports ongoing diarrhea with mild intermittent vomiting.  Onset 05/19/11.  Afebrile.  Last emesis 05/23/14. No relief with use of Immodium; stopped it 05/22/11. Feels weak d/t not eating since 05/19/11. Emergent sx ruled out. Advised to see MD within 24 hrs for diarrhea lasting longer than 24 hrs and not improving with home care per diarrhea or Other Change in Bowel Habits. Appt scheduled for 05/24/11 at 1115 with Dr Milinda Antis.

## 2011-05-24 NOTE — Assessment & Plan Note (Signed)
Lab today  Pt non compliant with draws Has been dehydrated last week  Will update

## 2011-05-24 NOTE — Patient Instructions (Signed)
Labs today  Keep drinking fluids - slowly in sips -- gatorade and vitamin water (with electolytes)  Labs today for your stomach virus and also the other problems we have been watching  After labs return -- we will likely refer you to our neurologist for weakness Out of work the rest of the week

## 2011-05-24 NOTE — Assessment & Plan Note (Signed)
Per pt has cut down a lot  Less tremor today

## 2011-05-24 NOTE — Progress Notes (Signed)
Subjective:    Patient ID: Stephanie Raymond, female    DOB: 1955/11/14, 56 y.o.   MRN: [id number]  HPI Is here for GI symptoms  Started getting sick on Friday night -- got bad diarrhea and nausea with dry heaves (some vomiting)  Once may have vomited bile , no coffee ground material Cramping in lower abd before bm  Has not changed since then  Vomited once last night  Is having diarrhea about every hour - watery  No blood in her stool   Has had the chills - but took temp and no fever   Last ate sandwhich on Friday -- [place]- [place])  Husband ate the same thing -- did not get sick   Has been drinking vitamin water and ginger ale  Got a few saltines down this am   Wants to have labs - did not have them in January as promised  Is often noncompliant At that time her cr was high/ anemic/ low platelet /hyponatremia -- suspected alcohol related and poss thyroid  Long discussion with her about this  Says her alcohol consumption is down a lot   No alcohol at all - last time she drank was a week ago -- had one drink  Maybe 3-4 drinks per week   Still having weakness in arms and legs since she was on humera Stopped taking it mid October  Wants to check this out - interested in seeing a specialist , did not improve when she stopped the drug  Also has aches and pains that are chronic  Patient Active Problem List  Diagnoses  . HYPOTHYROIDISM  . History of alcohol abuse  . DEPRESSION  . HYPERTENSION  . GERD  . PSORIASIS  . BACK PAIN, LUMBAR  . CARPAL TUNNEL SYNDROME, HX OF  . TRANSIENT ISCHEMIC ATTACK, HX OF  . INTERTRIGO, CANDIDAL  . ENLARGEMENT OF LYMPH NODES  . Hyponatremia  . ARF (acute renal failure)  . Thrombocytopenia  . Routine general medical examination at a health care facility  . Gynecological examination  . Other screening mammogram  . Former smoker  . Sore throat  . Renal insufficiency  . Anemia  . Hemorrhoids  . Gastroenteritis  . Weakness   Past  Medical History  Diagnosis Date  . Depression   . GERD (gastroesophageal reflux disease)   . Hypertension   . Hypothyroidism   . Hx of transient ischemic attack (TIA) 2000    Hosp ?  [facility], female   . History of CT scan 04/1998    small lacunar infarcts  . History of MRI of brain and brain stem 07/1998    white matter changes  . Psoriasis     on humira   Past Surgical History  Procedure Date  . Cervical conization w/bx 1980's  . Carotid ultrasound 04/1998    neg  . Foot surgery     toe treated, as a child  . Cervical cone biopsy 02/1999    microinvasive cervical carcinoma  . Partial hysterectomy 02/1999   History  Substance Use Topics  . Smoking status: Former Smoker    Quit date: 11/03/2010  . Smokeless tobacco: Not on file  . Alcohol Use: Not on file   Family History  Problem Relation Age of Onset  . Emphysema Mother     smoker  . Heart disease Father 61    CABG  . Heart disease Other     MI in 44s  . Heart disease Other  CHF  . Hypertension Other    Allergies  Allergen Reactions  . Diphenhydramine Hcl     REACTION: feel funny  . Humira Other (See Comments)    Could not walk.   Current Outpatient Prescriptions on File Prior to Visit  Medication Sig Dispense Refill  . Adalimumab (HUMIRA [place]) Inject into the skin. Every other week       . buPROPion (WELLBUTRIN SR) 150 MG 12 hr tablet Take 2 tablets (300 mg total) by mouth daily.  180 tablet  3  . Cholecalciferol (VITAMIN D-3 PO) Take 1 capsule by mouth daily.        . cloNIDine (CATAPRES) 0.2 MG tablet Take 0.5 tablets (0.1 mg total) by mouth daily.  45 tablet  3  . levothyroxine (SYNTHROID) 50 MCG tablet Take 1 tablet (50 mcg total) by mouth daily.  90 tablet  3  . losartan (COZAAR) 100 MG tablet Take 0.5 tablets (50 mg total) by mouth daily.  45 tablet  3  . omeprazole (PRILOSEC) 20 MG capsule Take 1 capsule (20 mg total) by mouth daily.  90 capsule  3  . ondansetron (ZOFRAN) 4 MG tablet Take  1 tablet (4 mg total) by mouth daily as needed for nausea.  30 tablet  0  . triamterene-hydrochlorothiazide (MAXZIDE-25) 37.5-25 MG per tablet Take 1 each (1 tablet total) by mouth daily.  90 tablet  3  . vitamin B-12 (CYANOCOBALAMIN) 500 MCG tablet Take 1 tablet (500 mcg total) by mouth daily.  90 tablet  3   Current Facility-Administered Medications on File Prior to Visit  Medication Dose Route Frequency Provider Last Rate Last Dose  . promethazine (PHENERGAN) injection 25 mg  25 mg Intramuscular Once Eustaquio Boyden, MD             Review of Systems  Review of Systems  Constitutional: Negative for fever, appetite change,unexpected weight change. pos for fatigue and malaise  Eyes: Negative for pain and visual disturbance.  Respiratory: Negative for cough and shortness of breath.   Cardiovascular: Negative for cp or palpitations    Gastrointestinal: Negative for abd pain, pos for diarrhea, nausea , neg for blood in stool or dark stool .  Genitourinary: Negative for urgency and frequency.  Skin: Negative for pallor or rash   MSK pos for aches and pains  Neurological: Negative for , light-headedness, numbness and headaches. pos for muscle weakness limbs (generalized)  Hematological: Negative for adenopathy. Does not bruise/bleed easily.  Psychiatric/Behavioral: Negative for dysphoric mood. The patient is not nervous/anxious.         Objective:   Physical Exam  Constitutional: She appears well-developed and well-nourished. No distress.       overwt and fatigued appearing  HENT:  Head: Normocephalic and atraumatic.  Mouth/Throat: Oropharynx is clear and moist.  Eyes: Conjunctivae and EOM are normal. Pupils are equal, round, and reactive to light. No scleral icterus.  Neck: Normal range of motion. Neck supple. No JVD present. No thyromegaly present.  Cardiovascular: Normal rate, regular rhythm, normal heart sounds and intact distal pulses.   Pulmonary/Chest: Effort normal and  breath sounds normal. No respiratory distress. She has no wheezes.  Abdominal: Soft. She exhibits no distension and no mass. There is no tenderness. There is no rebound and no guarding.       bs are mildly hyperactive but not high pitched   Musculoskeletal: Normal range of motion. She exhibits no edema and no tenderness.  Lymphadenopathy:    She has no  cervical adenopathy.  Neurological: She is alert. She has normal reflexes. She displays tremor. No cranial nerve deficit or sensory deficit. She exhibits normal muscle tone. Coordination normal.       Her usual tremor is improved today  She is generally weak in limbs- mostly quads and hands (grip) that is bilateral and generalized   Skin: Skin is warm and dry. No rash noted. No erythema. There is pallor.       Complexion is sallow but not jaundiced  Nl capillary refil Nl skin turgor  Psychiatric: She has a normal mood and affect.          Assessment & Plan:

## 2011-05-24 NOTE — Assessment & Plan Note (Signed)
Since Friday- just starting to improve with nausea  Disc fluid intake and avoidance of dehydration  Likely norovirus Labs today

## 2011-05-24 NOTE — Assessment & Plan Note (Signed)
Pt c/o muscle weakness- esp quad and hands- ever since being on humera  Lab today incl B vit Will likely ref to neuro

## 2011-05-25 ENCOUNTER — Inpatient Hospital Stay: Payer: Self-pay | Admitting: *Deleted

## 2011-05-25 LAB — CBC WITH DIFFERENTIAL/PLATELET
Basophil #: 0 10*3/uL (ref 0.0–0.1)
Basophil %: 0.2 %
Eosinophil #: 0.2 10*3/uL (ref 0.0–0.7)
Eosinophil %: 1.1 %
HCT: 35.2 % (ref 35.0–47.0)
HGB: 11.8 g/dL — ABNORMAL LOW (ref 12.0–16.0)
MCV: 113 fL — ABNORMAL HIGH (ref 80–100)
Monocyte #: 0.7 10*3/uL (ref 0.0–0.7)
Neutrophil #: 16.3 10*3/uL — ABNORMAL HIGH (ref 1.4–6.5)
Neutrophil %: 84.8 %
Platelet: 176 10*3/uL (ref 150–440)
RDW: 15.9 % — ABNORMAL HIGH (ref 11.5–14.5)

## 2011-05-25 LAB — MAGNESIUM: Magnesium: 0.7 mg/dL — ABNORMAL LOW

## 2011-05-25 LAB — URINALYSIS, COMPLETE
Glucose,UR: NEGATIVE mg/dL (ref 0–75)
Hyaline Cast: 19
Ketone: NEGATIVE
Nitrite: NEGATIVE
Ph: 5 (ref 4.5–8.0)
Specific Gravity: 1.016 (ref 1.003–1.030)

## 2011-05-25 LAB — COMPREHENSIVE METABOLIC PANEL
Albumin: 2.6 g/dL — ABNORMAL LOW (ref 3.4–5.0)
Alkaline Phosphatase: 77 U/L (ref 50–136)
BUN: 81 mg/dL — ABNORMAL HIGH (ref 7–18)
Bilirubin,Total: 1.4 mg/dL — ABNORMAL HIGH (ref 0.2–1.0)
Chloride: 90 mmol/L — ABNORMAL LOW (ref 98–107)
Creatinine: 3.79 mg/dL — ABNORMAL HIGH (ref 0.60–1.30)
EGFR (African American): 16 — ABNORMAL LOW
Glucose: 82 mg/dL (ref 65–99)
Osmolality: 281 (ref 275–301)
Potassium: 3.3 mmol/L — ABNORMAL LOW (ref 3.5–5.1)
SGOT(AST): 30 U/L (ref 15–37)

## 2011-05-25 LAB — TSH: Thyroid Stimulating Horm: 7.66 u[IU]/mL — ABNORMAL HIGH

## 2011-05-25 LAB — LIPASE, BLOOD: Lipase: 619 U/L — ABNORMAL HIGH (ref 73–393)

## 2011-05-26 LAB — CBC WITH DIFFERENTIAL/PLATELET
Basophil #: 0 10*3/uL (ref 0.0–0.1)
Basophil %: 0.1 %
Eosinophil #: 0.2 10*3/uL (ref 0.0–0.7)
Eosinophil %: 1.7 %
HGB: 9.9 g/dL — ABNORMAL LOW (ref 12.0–16.0)
Lymphocyte #: 1.1 10*3/uL (ref 1.0–3.6)
Lymphocyte %: 8.9 %
MCHC: 33.7 g/dL (ref 32.0–36.0)
MCV: 113 fL — ABNORMAL HIGH (ref 80–100)
Monocyte #: 0.7 10*3/uL (ref 0.0–0.7)
Neutrophil #: 10.3 10*3/uL — ABNORMAL HIGH (ref 1.4–6.5)
Platelet: 105 10*3/uL — ABNORMAL LOW (ref 150–440)
RBC: 2.6 10*6/uL — ABNORMAL LOW (ref 3.80–5.20)
WBC: 12.3 10*3/uL — ABNORMAL HIGH (ref 3.6–11.0)

## 2011-05-26 LAB — COMPREHENSIVE METABOLIC PANEL
Albumin: 1.9 g/dL — ABNORMAL LOW (ref 3.4–5.0)
Alkaline Phosphatase: 70 U/L (ref 50–136)
Chloride: 100 mmol/L (ref 98–107)
Creatinine: 2.72 mg/dL — ABNORMAL HIGH (ref 0.60–1.30)
EGFR (African American): 23 — ABNORMAL LOW
EGFR (Non-African Amer.): 19 — ABNORMAL LOW
Glucose: 69 mg/dL (ref 65–99)
Osmolality: 283 (ref 275–301)
Potassium: 3.4 mmol/L — ABNORMAL LOW (ref 3.5–5.1)
SGOT(AST): 27 U/L (ref 15–37)
SGPT (ALT): 10 U/L — ABNORMAL LOW

## 2011-05-26 LAB — MAGNESIUM: Magnesium: 2.2 mg/dL

## 2011-05-26 LAB — IRON AND TIBC
Iron Bind.Cap.(Total): 178 ug/dL — ABNORMAL LOW (ref 250–450)
Iron: 11 ug/dL — ABNORMAL LOW (ref 50–170)
Unbound Iron-Bind.Cap.: 167 ug/dL

## 2011-05-26 LAB — PROTEIN / CREATININE RATIO, URINE
Protein, Random Urine: 32 mg/dL — ABNORMAL HIGH (ref 0–12)
Protein/Creat. Ratio: 525 mg/gCREAT — ABNORMAL HIGH (ref 0–200)

## 2011-05-26 LAB — CLOSTRIDIUM DIFFICILE BY PCR

## 2011-05-26 LAB — HEMOGLOBIN A1C: Hemoglobin A1C: 4.7 % (ref 4.2–6.3)

## 2011-05-26 LAB — URINE CULTURE

## 2011-05-27 LAB — COMPREHENSIVE METABOLIC PANEL
Alkaline Phosphatase: 93 U/L (ref 50–136)
Anion Gap: 12 (ref 7–16)
BUN: 49 mg/dL — ABNORMAL HIGH (ref 7–18)
Bilirubin,Total: 0.9 mg/dL (ref 0.2–1.0)
Calcium, Total: 5.7 mg/dL — CL (ref 8.5–10.1)
Chloride: 109 mmol/L — ABNORMAL HIGH (ref 98–107)
Co2: 15 mmol/L — ABNORMAL LOW (ref 21–32)
Osmolality: 285 (ref 275–301)
Potassium: 3.7 mmol/L (ref 3.5–5.1)
SGOT(AST): 26 U/L (ref 15–37)
SGPT (ALT): 11 U/L — ABNORMAL LOW
Total Protein: 4.7 g/dL — ABNORMAL LOW (ref 6.4–8.2)

## 2011-05-27 LAB — CBC WITH DIFFERENTIAL/PLATELET
Basophil #: 0 10*3/uL (ref 0.0–0.1)
Basophil %: 0.2 %
Eosinophil #: 0.1 10*3/uL (ref 0.0–0.7)
HCT: 28.7 % — ABNORMAL LOW (ref 35.0–47.0)
Lymphocyte #: 1 10*3/uL (ref 1.0–3.6)
Lymphocyte %: 9.7 %
MCH: 38.3 pg — ABNORMAL HIGH (ref 26.0–34.0)
MCV: 113 fL — ABNORMAL HIGH (ref 80–100)
Monocyte #: 0.6 10*3/uL (ref 0.0–0.7)
Monocyte %: 5.8 %
Neutrophil #: 8.2 10*3/uL — ABNORMAL HIGH (ref 1.4–6.5)
RBC: 2.53 10*6/uL — ABNORMAL LOW (ref 3.80–5.20)
RDW: 15.7 % — ABNORMAL HIGH (ref 11.5–14.5)
WBC: 9.9 10*3/uL (ref 3.6–11.0)

## 2011-05-28 LAB — BASIC METABOLIC PANEL
BUN: 30 mg/dL — ABNORMAL HIGH (ref 7–18)
Calcium, Total: 6.1 mg/dL — CL (ref 8.5–10.1)
Creatinine: 1.06 mg/dL (ref 0.60–1.30)
EGFR (Non-African Amer.): 57 — ABNORMAL LOW
Glucose: 111 mg/dL — ABNORMAL HIGH (ref 65–99)
Osmolality: 275 (ref 275–301)
Sodium: 134 mmol/L — ABNORMAL LOW (ref 136–145)

## 2011-05-28 LAB — CBC WITH DIFFERENTIAL/PLATELET
Basophil #: 0 10*3/uL (ref 0.0–0.1)
Basophil %: 0.1 %
Eosinophil #: 0.1 10*3/uL (ref 0.0–0.7)
Lymphocyte #: 1 10*3/uL (ref 1.0–3.6)
MCHC: 33.7 g/dL (ref 32.0–36.0)
MCV: 113 fL — ABNORMAL HIGH (ref 80–100)
Monocyte #: 0.7 10*3/uL (ref 0.0–0.7)
Neutrophil #: 8.3 10*3/uL — ABNORMAL HIGH (ref 1.4–6.5)
Platelet: 126 10*3/uL — ABNORMAL LOW (ref 150–440)
RBC: 2.65 10*6/uL — ABNORMAL LOW (ref 3.80–5.20)
RDW: 16.1 % — ABNORMAL HIGH (ref 11.5–14.5)
WBC: 10 10*3/uL (ref 3.6–11.0)

## 2011-05-28 LAB — APTT: Activated PTT: 40.7 secs — ABNORMAL HIGH (ref 23.6–35.9)

## 2011-05-28 LAB — STOOL CULTURE

## 2011-05-28 LAB — LIPASE, BLOOD: Lipase: 875 U/L — ABNORMAL HIGH (ref 73–393)

## 2011-05-28 LAB — FOLATE: Folic Acid: 2.3 ng/mL — ABNORMAL LOW (ref 3.1–100.0)

## 2011-05-28 LAB — PROTIME-INR: Prothrombin Time: 18.9 secs — ABNORMAL HIGH (ref 11.5–14.7)

## 2011-05-28 LAB — FIBRIN DEGRADATION PROD.(ARMC ONLY): Fibrin Degradation Prod.: >40 (ref 2.1–7.7)

## 2011-05-29 LAB — CBC WITH DIFFERENTIAL/PLATELET
Basophil #: 0 x10 3/mm 3
Basophil %: 0.4 %
Eosinophil #: 0.2 x10 3/mm 3
Eosinophil %: 1.7 %
HCT: 26.8 % — ABNORMAL LOW
HGB: 8.9 g/dL — ABNORMAL LOW
Lymphocyte %: 13.6 %
Lymphs Abs: 1.3 x10 3/mm 3
MCH: 37.5 pg — ABNORMAL HIGH
MCHC: 33.2 g/dL
MCV: 113 fL — ABNORMAL HIGH
Monocyte #: 0.7 x10 3/mm 3
Monocyte %: 7.4 %
Neutrophil #: 7.3 x10 3/mm 3 — ABNORMAL HIGH
Neutrophil %: 76.9 %
Platelet: 106 x10 3/mm 3 — ABNORMAL LOW
RBC: 2.38 X10 6/mm 3 — ABNORMAL LOW
RDW: 16.3 % — ABNORMAL HIGH
WBC: 9.5 x10 3/mm 3

## 2011-05-29 LAB — COMPREHENSIVE METABOLIC PANEL
Albumin: 1.7 g/dL — ABNORMAL LOW (ref 3.4–5.0)
Alkaline Phosphatase: 81 U/L (ref 50–136)
Anion Gap: 10 (ref 7–16)
Bilirubin,Total: 0.6 mg/dL (ref 0.2–1.0)
Calcium, Total: 6.3 mg/dL — CL (ref 8.5–10.1)
Chloride: 103 mmol/L (ref 98–107)
EGFR (African American): >60
Potassium: 4.1 mmol/L (ref 3.5–5.1)
SGOT(AST): 21 U/L (ref 15–37)
SGPT (ALT): 8 U/L — ABNORMAL LOW
Total Protein: 4.5 g/dL — ABNORMAL LOW (ref 6.4–8.2)

## 2011-05-29 LAB — SEDIMENTATION RATE: Erythrocyte Sed Rate: 32 mm/h — ABNORMAL HIGH

## 2011-05-30 LAB — BASIC METABOLIC PANEL
Chloride: 102 mmol/L (ref 98–107)
Creatinine: 1.14 mg/dL (ref 0.60–1.30)
EGFR (Non-African Amer.): 53 — ABNORMAL LOW
Glucose: 93 mg/dL (ref 65–99)
Potassium: 3.8 mmol/L (ref 3.5–5.1)

## 2011-05-30 LAB — CBC WITH DIFFERENTIAL/PLATELET
Basophil #: 0 10*3/uL (ref 0.0–0.1)
Eosinophil #: 0.2 10*3/uL (ref 0.0–0.7)
Eosinophil %: 1.3 %
HCT: 29.8 % — ABNORMAL LOW (ref 35.0–47.0)
Lymphocyte #: 1.7 10*3/uL (ref 1.0–3.6)
Lymphocyte %: 11.1 %
MCH: 38 pg — ABNORMAL HIGH (ref 26.0–34.0)
MCHC: 33.3 g/dL (ref 32.0–36.0)
MCV: 114 fL — ABNORMAL HIGH (ref 80–100)
Monocyte #: 0.8 10*3/uL — ABNORMAL HIGH (ref 0.0–0.7)
Monocyte %: 5.3 %
Neutrophil #: 12.5 10*3/uL — ABNORMAL HIGH (ref 1.4–6.5)
Neutrophil %: 82 %
Platelet: 105 10*3/uL — ABNORMAL LOW (ref 150–440)
WBC: 15.3 10*3/uL — ABNORMAL HIGH (ref 3.6–11.0)

## 2011-05-30 LAB — MAGNESIUM: Magnesium: 1.1 mg/dL — ABNORMAL LOW

## 2011-05-30 LAB — UR PROT ELECTROPHORESIS, URINE RANDOM

## 2011-05-30 LAB — LIPASE, BLOOD: Lipase: 400 U/L — ABNORMAL HIGH (ref 73–393)

## 2011-05-31 LAB — COMPREHENSIVE METABOLIC PANEL
Albumin: 1.6 g/dL — ABNORMAL LOW (ref 3.4–5.0)
Anion Gap: 11 (ref 7–16)
BUN: 31 mg/dL — ABNORMAL HIGH (ref 7–18)
Bilirubin,Total: 0.8 mg/dL (ref 0.2–1.0)
Chloride: 97 mmol/L — ABNORMAL LOW (ref 98–107)
Co2: 24 mmol/L (ref 21–32)
EGFR (Non-African Amer.): 34 — ABNORMAL LOW
Osmolality: 271 (ref 275–301)
Potassium: 4.2 mmol/L (ref 3.5–5.1)
SGOT(AST): 19 U/L (ref 15–37)
SGPT (ALT): 7 U/L — ABNORMAL LOW
Total Protein: 4.7 g/dL — ABNORMAL LOW (ref 6.4–8.2)

## 2011-05-31 LAB — CBC WITH DIFFERENTIAL/PLATELET
Basophil #: 0.1 10*3/uL (ref 0.0–0.1)
Eosinophil #: 0.3 10*3/uL (ref 0.0–0.7)
HCT: 29.1 % — ABNORMAL LOW (ref 35.0–47.0)
HGB: 9.6 g/dL — ABNORMAL LOW (ref 12.0–16.0)
Lymphocyte #: 1.6 10*3/uL (ref 1.0–3.6)
MCHC: 33.1 g/dL (ref 32.0–36.0)
MCV: 113 fL — ABNORMAL HIGH (ref 80–100)
Monocyte #: 1 x10 3/mm — ABNORMAL HIGH (ref 0.2–0.9)
Neutrophil %: 83 %
Platelet: 104 10*3/uL — ABNORMAL LOW (ref 150–440)
RBC: 2.58 10*6/uL — ABNORMAL LOW (ref 3.80–5.20)
RDW: 16.5 % — ABNORMAL HIGH (ref 11.5–14.5)
WBC: 17.6 10*3/uL — ABNORMAL HIGH (ref 3.6–11.0)

## 2011-05-31 LAB — CULTURE, BLOOD (SINGLE)

## 2011-05-31 LAB — PROTEIN ELECTROPHORESIS(ARMC)

## 2011-05-31 LAB — MAGNESIUM: Magnesium: 1.4 mg/dL — ABNORMAL LOW

## 2011-06-01 LAB — COMPREHENSIVE METABOLIC PANEL
Albumin: 1.8 g/dL — ABNORMAL LOW (ref 3.4–5.0)
Alkaline Phosphatase: 96 U/L (ref 50–136)
BUN: 35 mg/dL — ABNORMAL HIGH (ref 7–18)
Calcium, Total: 8.7 mg/dL (ref 8.5–10.1)
Chloride: 96 mmol/L — ABNORMAL LOW (ref 98–107)
EGFR (African American): 31 — ABNORMAL LOW
EGFR (Non-African Amer.): 25 — ABNORMAL LOW
Glucose: 108 mg/dL — ABNORMAL HIGH (ref 65–99)
SGPT (ALT): 8 U/L — ABNORMAL LOW

## 2011-06-01 LAB — CBC WITH DIFFERENTIAL/PLATELET
Basophil %: 0.2 %
Eosinophil #: 0.3 10*3/uL (ref 0.0–0.7)
Eosinophil %: 1.1 %
HGB: 10 g/dL — ABNORMAL LOW (ref 12.0–16.0)
Lymphocyte %: 7.7 %
MCV: 114 fL — ABNORMAL HIGH (ref 80–100)
Monocyte %: 5 %
Neutrophil #: 21 10*3/uL — ABNORMAL HIGH (ref 1.4–6.5)
Neutrophil %: 86 %
RBC: 2.71 10*6/uL — ABNORMAL LOW (ref 3.80–5.20)
WBC: 24.5 10*3/uL — ABNORMAL HIGH (ref 3.6–11.0)

## 2011-06-01 LAB — MAGNESIUM: Magnesium: 1.6 mg/dL — ABNORMAL LOW

## 2011-06-02 LAB — RENAL FUNCTION PANEL
Albumin: 1.8 g/dL — ABNORMAL LOW (ref 3.4–5.0)
Calcium, Total: 9 mg/dL (ref 8.5–10.1)
Chloride: 95 mmol/L — ABNORMAL LOW (ref 98–107)
EGFR (African American): 21 — ABNORMAL LOW
EGFR (Non-African Amer.): 18 — ABNORMAL LOW
Glucose: 105 mg/dL — ABNORMAL HIGH (ref 65–99)
Phosphorus: 5.4 mg/dL — ABNORMAL HIGH (ref 2.5–4.9)
Potassium: 4.9 mmol/L (ref 3.5–5.1)

## 2011-06-02 LAB — PROTEIN / CREATININE RATIO, URINE
Creatinine, Urine: 198.5 mg/dL — ABNORMAL HIGH (ref 30.0–125.0)
Protein/Creat. Ratio: 237 mg/gCREAT — ABNORMAL HIGH (ref 0–200)

## 2011-06-02 LAB — URINALYSIS, COMPLETE
Bilirubin,UR: NEGATIVE
Blood: NEGATIVE
Hyaline Cast: 3
Nitrite: NEGATIVE
Ph: 5 (ref 4.5–8.0)
Protein: NEGATIVE
RBC,UR: <1 /HPF (ref 0–5)
Squamous Epithelial: NONE SEEN

## 2011-06-03 LAB — BASIC METABOLIC PANEL
Anion Gap: 12 (ref 7–16)
BUN: 49 mg/dL — ABNORMAL HIGH (ref 7–18)
Calcium, Total: 8.3 mg/dL — ABNORMAL LOW (ref 8.5–10.1)
Co2: 21 mmol/L (ref 21–32)
Creatinine: 3.48 mg/dL — ABNORMAL HIGH (ref 0.60–1.30)
EGFR (African American): 16 — ABNORMAL LOW
Glucose: 128 mg/dL — ABNORMAL HIGH (ref 65–99)
Osmolality: 274 (ref 275–301)

## 2011-06-05 LAB — CULTURE, BLOOD (SINGLE)

## 2011-06-12 ENCOUNTER — Telehealth: Payer: Self-pay

## 2011-06-12 NOTE — Telephone Encounter (Signed)
Also send this note to her dermatologist at [facility] when able-thanks

## 2011-06-12 NOTE — Telephone Encounter (Signed)
Thanks- also send this note chain to her derm at [facility]

## 2011-06-12 NOTE — Telephone Encounter (Signed)
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

## 2011-06-12 NOTE — Telephone Encounter (Signed)
Pt calling regarding can she take an extra Lasix pill, unsure of mg, states she was just d/c from [facility] and unsure of her dx. States her legs are swollen and cant get a pair of pants on, unable to get out of her chair to check her meds and d/c paperwork. States was advised to f/u with her PCP, spoke with [facility] office nurse and per Dr. Lucretia Roers orders she needs to call MD at [facility] to address this.

## 2011-06-12 NOTE — Telephone Encounter (Signed)
I just got her discharge summary and it turns out she is supposed to be on lasix 80 mg twice daily - can review that with her  Also looks like she is supposed to f/u with her rheumatologist Dr Gavin Potters - not derm  Thanks  I will hold paperwork on my desk for her f/u on wed  I will route this to both [place] and [employer]

## 2011-06-12 NOTE — Telephone Encounter (Signed)
Stephanie Raymond with [facility] said pt discharged from [facility] on 06/10/11. Pt said today her legs are so swollen she cannot get into stretch pants.Pt wants to know if can take extra Lasix. Lasix is not on pts med list. When I went back to [place] she said pt was started on Lasix at [facility] . Dr Milinda Antis said  pt to call dr (dermatologist at [facility]).Stephanie Raymond will inform pt.

## 2011-06-13 NOTE — Telephone Encounter (Signed)
I do not feel comfortable doing that - as 80 bid is already a very high dose - and need to watch renal fxn / etc  Let Dr Gavin Potters know how much you are swelling also  Will address when I see her in detail

## 2011-06-13 NOTE — Telephone Encounter (Signed)
Patient advised as instructed via telephone. 

## 2011-06-13 NOTE — Telephone Encounter (Signed)
Patient advised as instructed via telephone, she stated that she has an appt with Dr. Milinda Antis this week and she will be calling today to make an appt with Dr. Gavin Potters.  Patient stated that she is taking Lasix 80mg  twice daily and wants to know if she can take an additional Lasix to see if she can get rid of some of the fluid she is retaining?  Please advise.  Patient knows that Dr. Milinda Antis is not in the office until 9:00 am this morning.

## 2011-06-14 ENCOUNTER — Ambulatory Visit (INDEPENDENT_AMBULATORY_CARE_PROVIDER_SITE_OTHER): Payer: 59 | Admitting: Family Medicine

## 2011-06-14 ENCOUNTER — Encounter: Payer: Self-pay | Admitting: Family Medicine

## 2011-06-14 VITALS — BP 102/62 | HR 100 | Temp 98.0°F | Ht 63.0 in | Wt 207.5 lb

## 2011-06-14 DIAGNOSIS — N289 Disorder of kidney and ureter, unspecified: Secondary | ICD-10-CM

## 2011-06-14 DIAGNOSIS — D690 Allergic purpura: Secondary | ICD-10-CM | POA: Insufficient documentation

## 2011-06-14 DIAGNOSIS — R609 Edema, unspecified: Secondary | ICD-10-CM

## 2011-06-14 DIAGNOSIS — D649 Anemia, unspecified: Secondary | ICD-10-CM

## 2011-06-14 LAB — HEPATIC FUNCTION PANEL
ALT: 33 U/L (ref 0–35)
Albumin: 2.9 g/dL — ABNORMAL LOW (ref 3.5–5.2)
Total Protein: 6.6 g/dL (ref 6.0–8.3)

## 2011-06-14 LAB — CBC WITH DIFFERENTIAL/PLATELET
Basophils Relative: 0 % (ref 0.0–3.0)
Eosinophils Relative: 0.1 % (ref 0.0–5.0)
HCT: 28.8 % — ABNORMAL LOW (ref 36.0–46.0)
Lymphs Abs: 1 10*3/uL (ref 0.7–4.0)
MCHC: 33.4 g/dL (ref 30.0–36.0)
MCV: 107.4 fl — ABNORMAL HIGH (ref 78.0–100.0)
Monocytes Absolute: 0.6 10*3/uL (ref 0.1–1.0)
Platelets: 154 10*3/uL (ref 150.0–400.0)
RBC: 2.69 Mil/uL — ABNORMAL LOW (ref 3.87–5.11)
WBC: 17.1 10*3/uL — ABNORMAL HIGH (ref 4.5–10.5)

## 2011-06-14 LAB — RENAL FUNCTION PANEL
Albumin: 2.9 g/dL — ABNORMAL LOW (ref 3.5–5.2)
Calcium: 9 mg/dL (ref 8.4–10.5)
Creatinine, Ser: 1.1 mg/dL (ref 0.4–1.2)
Glucose, Bld: 98 mg/dL (ref 70–99)

## 2011-06-14 NOTE — Patient Instructions (Signed)
Please call your disability specialist and have them send another packet to my office [phone] fax  Will tentatively say return to work may 10th in afternoon after visit with Dr Amalia Hailey Continue current medicines  Labs today

## 2011-06-14 NOTE — Assessment & Plan Note (Signed)
Likely related to vasculitis - and imp in hospital Now with much edema Labs today  ? If could add a dose of bumex for edema if renal status is ok

## 2011-06-14 NOTE — Assessment & Plan Note (Addendum)
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

## 2011-06-14 NOTE — Progress Notes (Signed)
Subjective:    Patient ID: Stephanie Raymond, female    DOB: 1955-12-26, 56 y.o.   MRN: [id number]  HPI Here for f/u of hospitalization   Dx with HSP -- vasculitis at [facility]  (after being at [facility]) Had electrolyte abnormalities / edema / renal failure/ severe rash  Prednisone - currently- is helping, though she is still very very swollen On lasix 80 mg bid - the max she can take   Will see Dr Gavin Potters may 10th - for further management of this   Was supposed to return to work on may 6th -- does not think she will be ready yet  She is still quite weak and tired , still getting her labs straight and swelling in her legs with wounds from rash prohibit wearing of shoes and inhibit walking Having leakage of fluid from her legs   Disability examiner Elly Modena I filled out her original paperwork while she was in the hospital [phone] Will need more paperwork filled out - will request that   Really wants to get back to work after her visit with Dr Gavin Potters if he thinks she is able and if her swelling goes down and she can walk   Had a fall last night - was walking through the kitchen -- slipped and fell (from drainage in her legs - is challenging to wrap them) Hit bridge of nose -but overall ok  No headache or other symptoms   Is tolerating the prednisone ok  Also the lasix Needs labs today for renal panel and also cbc  Had transfusion in the hospital- helped the anemia    Patient Active Problem List  Diagnoses  . HYPOTHYROIDISM  . History of alcohol abuse  . DEPRESSION  . HYPERTENSION  . GERD  . PSORIASIS  . BACK PAIN, LUMBAR  . CARPAL TUNNEL SYNDROME, HX OF  . TRANSIENT ISCHEMIC ATTACK, HX OF  . INTERTRIGO, CANDIDAL  . ENLARGEMENT OF LYMPH NODES  . Hyponatremia  . ARF (acute renal failure)  . Thrombocytopenia  . Routine general medical examination at a health care facility  . Gynecological examination  . Other screening mammogram  . Former smoker  . Sore throat    . Renal insufficiency  . Anemia  . Hemorrhoids  . Gastroenteritis  . Weakness  . HSP (Henoch Schonlein purpura)   Past Medical History  Diagnosis Date  . Depression   . GERD (gastroesophageal reflux disease)   . Hypertension   . Hypothyroidism   . Hx of transient ischemic attack (TIA) 2000    Hosp ?  [facility], female   . History of CT scan 04/1998    small lacunar infarcts  . History of MRI of brain and brain stem 07/1998    white matter changes  . Psoriasis     on humira   Past Surgical History  Procedure Date  . Cervical conization w/bx 1980's  . Carotid ultrasound 04/1998    neg  . Foot surgery     toe treated, as a child  . Cervical cone biopsy 02/1999    microinvasive cervical carcinoma  . Partial hysterectomy 02/1999   History  Substance Use Topics  . Smoking status: Former Smoker    Quit date: 11/03/2010  . Smokeless tobacco: Not on file  . Alcohol Use: Not on file   Family History  Problem Relation Age of Onset  . Emphysema Mother     smoker  . Heart disease Father 9  CABG  . Heart disease Other     MI in 15s  . Heart disease Other     CHF  . Hypertension Other    Allergies  Allergen Reactions  . Diphenhydramine Hcl     REACTION: feel funny  . Humira Other (See Comments)    Could not walk.   Current Outpatient Prescriptions on File Prior to Visit  Medication Sig Dispense Refill  . buPROPion (WELLBUTRIN SR) 150 MG 12 hr tablet Take 2 tablets (300 mg total) by mouth daily.  180 tablet  3  . cloNIDine (CATAPRES) 0.2 MG tablet Take 0.5 tablets (0.1 mg total) by mouth daily.  45 tablet  3  . levothyroxine (SYNTHROID) 50 MCG tablet Take 1 tablet (50 mcg total) by mouth daily.  90 tablet  3  . losartan (COZAAR) 100 MG tablet Take 0.5 tablets (50 mg total) by mouth daily.  45 tablet  3  . OLANZapine (ZYPREXA) 2.5 MG tablet Take 2.5 mg by mouth at bedtime.      [facility] Kitchen omeprazole (PRILOSEC) 20 MG capsule Take 1 capsule (20 mg total) by mouth  daily.  90 capsule  3  . triamterene-hydrochlorothiazide (MAXZIDE-25) 37.5-25 MG per tablet Take 1 each (1 tablet total) by mouth daily.  90 tablet  3  . Cholecalciferol (VITAMIN D-3 PO) Take 1 capsule by mouth daily.         Current Facility-Administered Medications on File Prior to Visit  Medication Dose Route Frequency Provider Last Rate Last Dose  . promethazine (PHENERGAN) injection 25 mg  25 mg Intramuscular Once Eustaquio Boyden, MD             Review of Systems Review of Systems  Constitutional: Negative for fever, appetite change,  and unexpected weight change. pos for fatigue Eyes: Negative for pain and visual disturbance.  Respiratory: Negative for cough and shortness of breath.   Cardiovascular: Negative for cp or palpitations   pos for severe swelling in legs -tight and painful Gastrointestinal: Negative for nausea, diarrhea and constipation.  Genitourinary: Negative for urgency and frequency.  Skin: Negative for pallor and pos for rash - in extremeties - blistering / severe swelling and redness   Neurological: Negative for weakness, light-headedness, numbness and headaches.  Hematological: Negative for adenopathy. Does not bruise/bleed easily.  Psychiatric/Behavioral: Negative for dysphoric mood. The patient is not nervous/anxious.  - she is very stressed about her work situation         Objective:   Physical Exam  Constitutional: She appears well-developed and well-nourished. No distress.       Fatigued appearing but in good spirits   HENT:  Head: Normocephalic.  Nose: Nose normal.  Mouth/Throat: Oropharynx is clear and moist.       Scab on bridge of nose - clean appearing  No ecchymosis or swelling   Eyes: Conjunctivae and EOM are normal. Pupils are equal, round, and reactive to light. No scleral icterus.       Mild conj pallor   Neck: Normal range of motion. Neck supple. No JVD present. Carotid bruit is not present. No thyromegaly present.  Cardiovascular:  Normal rate, regular rhythm and normal heart sounds.  Exam reveals no gallop.   Pulmonary/Chest: Effort normal. No respiratory distress. She has wheezes.       A few scant wheezes at the bases - espiratory  Abdominal: Soft. Normal appearance and bowel sounds are normal. She exhibits no distension, no fluid wave, no abdominal bruit, no ascites and no mass.  Musculoskeletal: Normal range of motion. She exhibits edema and tenderness.       Lower legs have 2 plus edema with some large vesicles draining clear fluid/ also mild redness  Areas of swelling are diffusely tender   Lymphadenopathy:    She has no cervical adenopathy.  Neurological: She is alert. She has normal reflexes. No cranial nerve deficit. She exhibits normal muscle tone. Coordination normal.       Mild baseline hand tremor is present  Skin: Skin is warm. Rash noted. There is erythema. No pallor.  Psychiatric: She has a normal mood and affect.          Assessment & Plan:

## 2011-06-14 NOTE — Assessment & Plan Note (Addendum)
Rev hospital records and studies in detail today  Will be f/u with rheumatology soon  Then likely return to work if feeling better Tolerating prednisone  Is overall improved except for edema  Lab today- anemia and renal insuff  Will send to Dr Gavin Potters

## 2011-06-15 ENCOUNTER — Telehealth: Payer: Self-pay

## 2011-06-15 DIAGNOSIS — R609 Edema, unspecified: Secondary | ICD-10-CM | POA: Insufficient documentation

## 2011-06-15 MED ORDER — BUMETANIDE 0.5 MG PO TABS: ORAL_TABLET | ORAL | Status: DC

## 2011-06-15 NOTE — Assessment & Plan Note (Signed)
Of legs is presumably from her vasculitis in areas of rash  Is painful and bothersome with some weeping Already on lasix 80 bid  ? If could add a dose of bumex - if her renal panel is ok

## 2011-06-15 NOTE — Telephone Encounter (Signed)
Is this ok to give to patient when completed?

## 2011-06-15 NOTE — Telephone Encounter (Signed)
Patient advised as instructed via telephone.  Her husband is not coming back out again today and she won't be able to pick up the medication until tomorrow.  Is it ok for her to still have labs done on Monday or should she have them done on Tuesday?

## 2011-06-15 NOTE — Telephone Encounter (Signed)
Monday is fine 

## 2011-06-15 NOTE — Telephone Encounter (Signed)
Called patient back at home number and got no answer or machine.  Will call back later.

## 2011-06-15 NOTE — Telephone Encounter (Signed)
Yes- I just finished it -- ask if we can just fax it if she does not want to pick it up

## 2011-06-15 NOTE — Telephone Encounter (Signed)
Px sent to [facility] - will need to check renal panel on her Monday - since we have to watch kidney fxn with this  Please schedule that  Will have her take this once daily for 3 days Let me know tomorrow if today's dose helps

## 2011-06-15 NOTE — Telephone Encounter (Signed)
Pt left v/m temporary disability request copy of 06/14/11 visit sent to patient. 06/14/11 visit is not completed yet. Pt can be reached [phone].

## 2011-06-15 NOTE — Telephone Encounter (Signed)
Message copied by Judy Pimple on Thu Jun 15, 2011  1:23 PM ------      Message from: Gilmer Mor      Created: Thu Jun 15, 2011 12:44 PM       Patient advised as instructed via telephone, she will have her husband come by tomorrow and pick up urine container and ifob kit, he cannot get by the office today.  Patient agrees to start Bumex, uses [facility].

## 2011-06-16 ENCOUNTER — Telehealth: Payer: Self-pay

## 2011-06-16 MED ORDER — HYDROCODONE-ACETAMINOPHEN 10-500 MG PO TABS: 1.0000 | ORAL_TABLET | Freq: Four times a day (QID) | ORAL | Status: DC | PRN

## 2011-06-16 NOTE — Telephone Encounter (Signed)
Pt request  Oxycodone #8 tabs for foot pain.Pt requested from [facility] early afternoon but no response. Pt said Tramadol and Vicodin do not help her pain. Pt said if Dr Milinda Antis could think of another med to send in pt uses [facility] pt can be reached at [phone].

## 2011-06-16 NOTE — Telephone Encounter (Signed)
Patient advised as instructed via telephone, Rx [facility]. 

## 2011-06-16 NOTE — Telephone Encounter (Signed)
Patient advised as instructed via telephone. 

## 2011-06-16 NOTE — Telephone Encounter (Signed)
Addended by: Roxy Manns A on: 06/16/2011 05:05 PM   Modules accepted: Orders

## 2011-06-16 NOTE — Telephone Encounter (Signed)
lortab is the higher dose- was written for call in

## 2011-06-16 NOTE — Telephone Encounter (Signed)
Cannot call in oxycodone - but can vicodin -- ? She says that does not work well but perhaps a higher dose of it ?  Px written for call in  - for vicodin  Hope her med from [facility] gets done- but perhaps this will help through the weekend Let me know if fever or other symptoms  Update me Monday with how she is feeling

## 2011-06-18 NOTE — Telephone Encounter (Signed)
Higher dose vicodin was called in for her- since too late to come in and pick up px for oxycodone

## 2011-06-19 ENCOUNTER — Other Ambulatory Visit: Payer: 59

## 2011-06-20 ENCOUNTER — Ambulatory Visit (INDEPENDENT_AMBULATORY_CARE_PROVIDER_SITE_OTHER): Payer: 59 | Admitting: *Deleted

## 2011-06-20 ENCOUNTER — Other Ambulatory Visit (INDEPENDENT_AMBULATORY_CARE_PROVIDER_SITE_OTHER): Payer: 59

## 2011-06-20 DIAGNOSIS — R300 Dysuria: Secondary | ICD-10-CM

## 2011-06-20 DIAGNOSIS — I10 Essential (primary) hypertension: Secondary | ICD-10-CM

## 2011-06-20 LAB — POCT URINALYSIS DIPSTICK
Leukocytes, UA: NEGATIVE
Protein, UA: NEGATIVE
Spec Grav, UA: <=1.005
Urobilinogen, UA: 0.2

## 2011-06-21 ENCOUNTER — Ambulatory Visit: Payer: Self-pay | Admitting: Oncology

## 2011-06-21 ENCOUNTER — Ambulatory Visit (INDEPENDENT_AMBULATORY_CARE_PROVIDER_SITE_OTHER): Payer: 59 | Admitting: Family Medicine

## 2011-06-21 ENCOUNTER — Encounter: Payer: Self-pay | Admitting: Family Medicine

## 2011-06-21 VITALS — BP 100/70 | HR 96 | Temp 98.5°F | Wt 189.0 lb

## 2011-06-21 DIAGNOSIS — D690 Allergic purpura: Secondary | ICD-10-CM

## 2011-06-21 DIAGNOSIS — D649 Anemia, unspecified: Secondary | ICD-10-CM

## 2011-06-21 DIAGNOSIS — S81009A Unspecified open wound, unspecified knee, initial encounter: Secondary | ICD-10-CM

## 2011-06-21 DIAGNOSIS — S91009A Unspecified open wound, unspecified ankle, initial encounter: Secondary | ICD-10-CM

## 2011-06-21 DIAGNOSIS — S81809A Unspecified open wound, unspecified lower leg, initial encounter: Secondary | ICD-10-CM

## 2011-06-21 DIAGNOSIS — R609 Edema, unspecified: Secondary | ICD-10-CM

## 2011-06-21 LAB — RENAL FUNCTION PANEL
Glucose, Bld: 70 mg/dL (ref 70–99)
Phosphorus: 2.8 mg/dL (ref 2.3–4.6)
Potassium: 4.1 mEq/L (ref 3.5–5.1)
Sodium: 140 mEq/L (ref 135–145)

## 2011-06-21 MED ORDER — HYDROCODONE-ACETAMINOPHEN 10-500 MG PO TABS: 1.0000 | ORAL_TABLET | Freq: Four times a day (QID) | ORAL | Status: AC | PRN

## 2011-06-21 MED ORDER — MUPIROCIN 2 % EX OINT: TOPICAL_OINTMENT | Freq: Three times a day (TID) | CUTANEOUS | Status: AC

## 2011-06-21 NOTE — Progress Notes (Signed)
Subjective:    Patient ID: Stephanie Raymond, female    DOB: [birth year], 56 y.o.   MRN: [id number]  HPI Here for f/u of edema , HSP, anemia and place on ankle  Areas of skin are broken down on the back of ankles - from the swelling  Husband is cleansing with saline and using gauze and neosporin  Wrapping up in gauze  No pus  Some pain - the lortab helps with this   Last visit much edema Added bumex for 3 d Wt is down 16.5 lb Cr went up to 1.3 from 1.1 Lab Results  Component Value Date   CREATININE 1.3* 06/20/2011   BUN 38* 06/20/2011   NA 140 06/20/2011   K 4.1 06/20/2011   CL 98 06/20/2011   CO2 26 06/20/2011     Lab Results  Component Value Date   WBC 17.1 Repeated and verified X2.* 06/14/2011   HGB 9.6* 06/14/2011   HCT 28.8* 06/14/2011   MCV 107.4* 06/14/2011   PLT 154.0 06/14/2011   ua shows no blood Pending ifob ? If rel to vasculitis dillutional  Had transf in hosp  Has been waking up in middle of the night with sharp pains in her legs  Pain has been happening since she started the vasculitis The pain med - lortab (takes it sparingly)   Patient Active Problem List  Diagnoses  . HYPOTHYROIDISM  . History of alcohol abuse  . DEPRESSION  . HYPERTENSION  . GERD  . PSORIASIS  . BACK PAIN, LUMBAR  . CARPAL TUNNEL SYNDROME, HX OF  . TRANSIENT ISCHEMIC ATTACK, HX OF  . INTERTRIGO, CANDIDAL  . ENLARGEMENT OF LYMPH NODES  . Hyponatremia  . Thrombocytopenia  . Routine general medical examination at a health care facility  . Gynecological examination  . Other screening mammogram  . Former smoker  . Sore throat  . Renal insufficiency  . Anemia  . Hemorrhoids  . Gastroenteritis  . Weakness  . HSP (Henoch Schonlein purpura)  . Edema  . Wound, open, leg   Past Medical History  Diagnosis Date  . Depression   . GERD (gastroesophageal reflux disease)   . Hypertension   . Hypothyroidism   . Hx of transient ischemic attack (TIA) 2000    Hosp ?  [facility]  [facility]   . History of CT scan 04/1998    small lacunar infarcts  . History of MRI of brain and brain stem 07/1998    white matter changes  . Psoriasis     on humira   Past Surgical History  Procedure Date  . Cervical conization w/bx 1980's  . Carotid ultrasound 04/1998    neg  . Foot surgery     toe treated, as a child  . Cervical cone biopsy 02/1999    microinvasive cervical carcinoma  . Partial hysterectomy 02/1999   History  Substance Use Topics  . Smoking status: Former Smoker    Quit date: 11/03/2010  . Smokeless tobacco: Not on file  . Alcohol Use: Not on file   Family History  Problem Relation Age of Onset  . Emphysema Mother     smoker  . Heart disease Father 50    CABG  . Heart disease Other     MI in 52s  . Heart disease Other     CHF  . Hypertension Other    Allergies  Allergen Reactions  . Adalimumab Other (See Comments)    Could not walk.  [facility]  Diphenhydramine Hcl     REACTION: feel funny   Current Outpatient Prescriptions on File Prior to Visit  Medication Sig Dispense Refill  . buPROPion (WELLBUTRIN SR) 150 MG 12 hr tablet Take 2 tablets (300 mg total) by mouth daily.  180 tablet  3  . Cholecalciferol (VITAMIN D-3 PO) Take 1 capsule by mouth daily.        . cloNIDine (CATAPRES) 0.2 MG tablet Take 0.5 tablets (0.1 mg total) by mouth daily.  45 tablet  3  . levothyroxine (SYNTHROID) 50 MCG tablet Take 1 tablet (50 mcg total) by mouth daily.  90 tablet  3  . losartan (COZAAR) 100 MG tablet Take 0.5 tablets (50 mg total) by mouth daily.  45 tablet  3  . NON FORMULARY Potassium Chloride powder 32meq-one packet with water daily      . OLANZapine (ZYPREXA) 2.5 MG tablet Take 2.5 mg by mouth at bedtime.      [facility] omeprazole (PRILOSEC) 20 MG capsule Take 1 capsule (20 mg total) by mouth daily.  90 capsule  3  . triamterene-hydrochlorothiazide (MAXZIDE-25) 37.5-25 MG per tablet Take 1 each (1 tablet total) by mouth daily.  90 tablet  3  . bumetanide (BUMEX) 0.5  MG tablet Take 1 pill each am for 3 days  10 tablet  0  . oxycodone (OXY-IR) 5 MG capsule Take 5 mg by mouth every 6 (six) hours as needed.      . predniSONE (DELTASONE) 10 MG tablet Take 10 mg by mouth daily. Taper ends 07/11/2011       Current Facility-Administered Medications on File Prior to Visit  Medication Dose Route Frequency Provider Last Rate Last Dose  . promethazine (PHENERGAN) injection 25 mg  25 mg Intramuscular Once Eustaquio Boyden, MD          Review of Systems Review of Systems  Constitutional: Negative for fever, appetite change, fatigue and unexpected weight change.  Eyes: Negative for pain and visual disturbance.  Respiratory: Negative for cough and shortness of breath.   Cardiovascular: Negative for cp or palpitations    Gastrointestinal: Negative for nausea, diarrhea and constipation.  Genitourinary: Negative for urgency and frequency.  Skin: Negative for pallor or rash  pos for ulcers/ skin breakdown on her lower legs - some healing , neg for itching , pos for redness that is much improved  MSK pos for sharp pains in legs , pos for pedal edema that is much improved Neurological: Negative for weakness, light-headedness, numbness and headaches.  Hematological: Negative for adenopathy. Does not bruise/bleed easily.  Psychiatric/Behavioral: Negative for dysphoric mood. The patient is not nervous/anxious.          Objective:   Physical Exam  Constitutional: She appears well-developed and well-nourished. No distress.       overwt and frail appearing   HENT:  Head: Normocephalic and atraumatic.  Mouth/Throat: Oropharynx is clear and moist.  Eyes: Conjunctivae and EOM are normal. Pupils are equal, round, and reactive to light. Right eye exhibits no discharge. Left eye exhibits no discharge. No scleral icterus.  Neck: Normal range of motion. Neck supple. No JVD present. Carotid bruit is not present. No thyromegaly present.  Cardiovascular: Normal rate, regular  rhythm, normal heart sounds and intact distal pulses.  Exam reveals no gallop.   Pulmonary/Chest: Effort normal and breath sounds normal. No respiratory distress. She has no wheezes.  Abdominal: She exhibits no abdominal bruit.  Musculoskeletal: She exhibits edema and tenderness.  One plus edema in ankles and feet -much improved  Tenderness over area of ulceration and skin cracking   Lymphadenopathy:    She has no cervical adenopathy.  Neurological: She is alert. She has normal reflexes. No cranial nerve deficit. She exhibits normal muscle tone. Coordination normal.  Skin: Skin is warm and dry.       Ulcerations on R ankles - one wrapping around back of ankle and one on proximal heel  Yellow granulation tissue with clear drainage - cx swab obt  R ankle- one 1 cm area lower calf, some mild linear cracking over proximal heel  All cleaned and dressed with bactroban and loose gauze   Psychiatric: She has a normal mood and affect.          Assessment & Plan:

## 2011-06-21 NOTE — Assessment & Plan Note (Signed)
HSP lesions on ankles have turned into ulcers at this time - none appearing infected, but wound cx obt just in case Dressed with bactroban and loose gauze Much imp in her edema Ref to wound care center for help with getting them healed

## 2011-06-21 NOTE — Assessment & Plan Note (Signed)
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

## 2011-06-21 NOTE — Assessment & Plan Note (Signed)
?   If related to her HSP/ vasculitis or dillution - pending ifob ua clear- no blood

## 2011-06-21 NOTE — Assessment & Plan Note (Signed)
Vasculitis- aff renal fxn/ causing blisters (now ulcers) on legs Edema is resolving  Pain continues - refilled lortab ? If anemia is related For f/u with Dr Gavin Potters soon  Continues to tolerate low dose prednisone

## 2011-06-21 NOTE — Patient Instructions (Signed)
We will do referral to the wound care center at check out  Dress your wounds with bactroban ointment and loose gauze  We will let you know when wound culture returns  Use caution with pain medicine  Follow up with Dr Gavin Potters as planned  I will be waiting on stool card result

## 2011-06-23 ENCOUNTER — Other Ambulatory Visit: Payer: 59

## 2011-06-25 LAB — WOUND CULTURE

## 2011-06-26 ENCOUNTER — Encounter: Payer: Self-pay | Admitting: Cardiothoracic Surgery

## 2011-06-26 ENCOUNTER — Other Ambulatory Visit: Payer: Self-pay | Admitting: *Deleted

## 2011-06-26 ENCOUNTER — Encounter: Payer: Self-pay | Admitting: Nurse Practitioner

## 2011-06-26 MED ORDER — DOXYCYCLINE HYCLATE 100 MG PO TBEC: 100.0000 mg | DELAYED_RELEASE_TABLET | Freq: Two times a day (BID) | ORAL | Status: DC

## 2011-06-27 ENCOUNTER — Telehealth: Payer: Self-pay | Admitting: Family Medicine

## 2011-06-27 NOTE — Telephone Encounter (Signed)
Please let pt know that I spoke with Mitzie Na (NP) at the wound care center today and she gave me an update , we discussed her plan for wound treatment that sounded good  She noticed in terms of antibiotic coverage for her infection that she had coupons for another antibiotic called linezolid (zyvox)- and mentioned that this med may be a better option than the doxycycline she is in  Please ask pt if she was given the coupon and if she wants to try it - let me know

## 2011-06-28 NOTE — Telephone Encounter (Signed)
Patient advised as instructed via telephone, she stated that she is willing to try the medication but no mention was made regarding the coupon.

## 2011-06-28 NOTE — Telephone Encounter (Signed)
It would be quite expensive without coupon - so continue what she is on until next would center appt- see if she has them, and let me know  Keep me updated with how she is feeling, thanks

## 2011-06-28 NOTE — Telephone Encounter (Signed)
Left message on cell phone voicemail advising patient as instructed.   

## 2011-07-05 ENCOUNTER — Telehealth: Payer: Self-pay | Admitting: Family Medicine

## 2011-07-05 DIAGNOSIS — Z8679 Personal history of other diseases of the circulatory system: Secondary | ICD-10-CM

## 2011-07-05 DIAGNOSIS — D690 Allergic purpura: Secondary | ICD-10-CM

## 2011-07-05 DIAGNOSIS — D649 Anemia, unspecified: Secondary | ICD-10-CM

## 2011-07-05 DIAGNOSIS — R531 Weakness: Secondary | ICD-10-CM

## 2011-07-05 NOTE — Telephone Encounter (Signed)
Patient's spouse does not have permission to discuss patients health information.  Called patient and Dr. Milinda Antis will speak with her.

## 2011-07-05 NOTE — Telephone Encounter (Signed)
Caller: Gene/Spouse; PCP: Tower, Marne A.; CB#: [phone];  Call regarding Fatigue; Increased fatigue onset 07/03/11. Pt went to the wound clinic on 07/03/11, fatigue began after this visit. Husband declines triage. He would like a call back from Dr. Milinda Antis.

## 2011-07-05 NOTE — Telephone Encounter (Signed)
Can you check to see if I have permission to talk to him or not  ? -- if not I am happy to call her  Also please call for note from [place] clinic -I was waiting on that follow up info , thanks

## 2011-07-05 NOTE — Telephone Encounter (Signed)
I spoke to patient - she saw Dr Gavin Potters last week but does not really remember what he said about the HSP-- she is to wean off the prednisone later this month , and see him back on the 29th  I am waiting on his progress note  She simply has fatigue - and is trying to avoid the oxycodone due to sedation but leg wounds still hurt too much  My concern is what to do about the anemia - as this could add to the fatigue - and would like to ask Dr Gavin Potters what he thinks - ? If related to the HSP I will call him tomorrow as it is after hours today- and then call pt back   Please still call for his note from last week-thanks

## 2011-07-06 NOTE — Telephone Encounter (Signed)
Thanks- can you please call her and let her know - I tried to reach Dr Gavin Potters today and had no luck- I will try again tomorrow and get back to her -thanks  Then send this back to me

## 2011-07-06 NOTE — Telephone Encounter (Signed)
Patient advised as instructed via telephone. 

## 2011-07-06 NOTE — Telephone Encounter (Signed)
Requested last office visit note from last week be faxed to Dr. Milinda Antis at [phone].

## 2011-07-06 NOTE — Telephone Encounter (Signed)
Note from Dr. Gavin Potters in your IN box.

## 2011-07-07 ENCOUNTER — Telehealth: Payer: Self-pay | Admitting: Family Medicine

## 2011-07-07 DIAGNOSIS — E871 Hypo-osmolality and hyponatremia: Secondary | ICD-10-CM

## 2011-07-07 DIAGNOSIS — D649 Anemia, unspecified: Secondary | ICD-10-CM

## 2011-07-07 DIAGNOSIS — D690 Allergic purpura: Secondary | ICD-10-CM

## 2011-07-07 DIAGNOSIS — E039 Hypothyroidism, unspecified: Secondary | ICD-10-CM

## 2011-07-07 DIAGNOSIS — R5383 Other fatigue: Secondary | ICD-10-CM

## 2011-07-07 NOTE — Telephone Encounter (Signed)
Referral was done and we are going to check labs on her next week - lab appt made

## 2011-07-07 NOTE — Telephone Encounter (Signed)
I spoke to DR Gavin Potters- we rev the case and I rev his note -- unsure what is causing anemia (no more GI sympt) , and unsure if HSP could cause poss hemolysis or other process He recommended a referral to hematology- Dr Sherrlyn Hock if possible I let pt know I would proceed with that referral  Please let me know when we get the appt

## 2011-07-07 NOTE — Telephone Encounter (Signed)
Future labs for next week for anemia

## 2011-07-10 ENCOUNTER — Telehealth: Payer: Self-pay | Admitting: Family Medicine

## 2011-07-10 ENCOUNTER — Other Ambulatory Visit (INDEPENDENT_AMBULATORY_CARE_PROVIDER_SITE_OTHER): Payer: 59

## 2011-07-10 DIAGNOSIS — D649 Anemia, unspecified: Secondary | ICD-10-CM

## 2011-07-10 NOTE — Telephone Encounter (Signed)
Left message on machine at home for patient to return call. 

## 2011-07-10 NOTE — Telephone Encounter (Signed)
I do not have her lab results back - should be soon (takes a little longer with [facility]) Go ahead and make appt with me this week so I can check her out  If she feels she needs to at any point- go to ER

## 2011-07-10 NOTE — Telephone Encounter (Signed)
Patient advised as instructed via telephone, she stated that she will call tomorrow morning to schedule appt with Dr. Milinda Antis this week.  Patient just had labs done this morning so they wouldn't be back yet.

## 2011-07-10 NOTE — Telephone Encounter (Signed)
Pt is here for blood work with her husband. She is extremely weak and keeps falling. She has a huge bruise on her forehead and a cut on her nose from falling. She is anemic and is wondering what she should do.

## 2011-07-11 LAB — CBC WITH DIFFERENTIAL/PLATELET
Basophils Absolute: 0 10*3/uL (ref 0.0–0.2)
Eos: 0 % (ref 0–7)
Eosinophils Absolute: 0 10*3/uL (ref 0.0–0.4)
HCT: 27.7 % — ABNORMAL LOW (ref 34.0–46.6)
Immature Granulocytes: 0 % (ref 0–2)
Lymphocytes Absolute: 1 10*3/uL (ref 0.7–4.5)
MCH: 33.9 pg — ABNORMAL HIGH (ref 26.6–33.0)
MCHC: 33.2 g/dL (ref 31.5–35.7)
MCV: 102 fL — ABNORMAL HIGH (ref 79–97)
Monocytes Absolute: 0.5 10*3/uL (ref 0.1–1.0)
RDW: 15.9 % — ABNORMAL HIGH (ref 12.3–15.4)

## 2011-07-11 LAB — COMPREHENSIVE METABOLIC PANEL
Albumin/Globulin Ratio: 0.9 — ABNORMAL LOW (ref 1.1–2.5)
Albumin: 3.4 g/dL — ABNORMAL LOW (ref 3.5–5.5)
BUN/Creatinine Ratio: 30 — ABNORMAL HIGH (ref 9–23)
BUN: 41 mg/dL — ABNORMAL HIGH (ref 6–24)
GFR calc Af Amer: 50 mL/min/{1.73_m2} — ABNORMAL LOW (ref >59)
GFR calc non Af Amer: 43 mL/min/{1.73_m2} — ABNORMAL LOW (ref >59)
Globulin, Total: 3.9 g/dL (ref 1.5–4.5)
Glucose: 108 mg/dL — ABNORMAL HIGH (ref 65–99)
Total Bilirubin: 1 mg/dL (ref 0.0–1.2)
Total Protein: 7.3 g/dL (ref 6.0–8.5)

## 2011-07-11 LAB — RETICULOCYTES: Retic Ct Pct: 2.2 % (ref 0.6–2.6)

## 2011-07-11 LAB — IRON AND TIBC
Iron Saturation: 14 % — ABNORMAL LOW (ref 15–55)
TIBC: 215 ug/dL — ABNORMAL LOW (ref 250–450)

## 2011-07-12 ENCOUNTER — Ambulatory Visit (INDEPENDENT_AMBULATORY_CARE_PROVIDER_SITE_OTHER): Payer: 59 | Admitting: Family Medicine

## 2011-07-12 ENCOUNTER — Telehealth: Payer: Self-pay | Admitting: Family Medicine

## 2011-07-12 ENCOUNTER — Encounter: Payer: Self-pay | Admitting: Family Medicine

## 2011-07-12 VITALS — BP 118/60 | HR 113 | Temp 98.1°F | Wt 176.0 lb

## 2011-07-12 DIAGNOSIS — Z9181 History of falling: Secondary | ICD-10-CM

## 2011-07-12 DIAGNOSIS — N289 Disorder of kidney and ureter, unspecified: Secondary | ICD-10-CM

## 2011-07-12 DIAGNOSIS — S81009A Unspecified open wound, unspecified knee, initial encounter: Secondary | ICD-10-CM

## 2011-07-12 DIAGNOSIS — R531 Weakness: Secondary | ICD-10-CM | POA: Insufficient documentation

## 2011-07-12 DIAGNOSIS — S81809A Unspecified open wound, unspecified lower leg, initial encounter: Secondary | ICD-10-CM

## 2011-07-12 DIAGNOSIS — R5381 Other malaise: Secondary | ICD-10-CM

## 2011-07-12 DIAGNOSIS — S91009A Unspecified open wound, unspecified ankle, initial encounter: Secondary | ICD-10-CM

## 2011-07-12 DIAGNOSIS — R5383 Other fatigue: Secondary | ICD-10-CM

## 2011-07-12 DIAGNOSIS — D649 Anemia, unspecified: Secondary | ICD-10-CM

## 2011-07-12 DIAGNOSIS — R296 Repeated falls: Secondary | ICD-10-CM | POA: Insufficient documentation

## 2011-07-12 DIAGNOSIS — S0990XA Unspecified injury of head, initial encounter: Secondary | ICD-10-CM

## 2011-07-12 DIAGNOSIS — Z87891 Personal history of nicotine dependence: Secondary | ICD-10-CM

## 2011-07-12 NOTE — Assessment & Plan Note (Addendum)
See assessment for falls No focal findings  Ref to PT eval and tx  Long disc about safety and fall prev Will have to extend her FMLA- time off work  Letter written the next day >40 min spent with face to face with patient, >50% counseling and/or coordinating care- for all problems today , and hosp records/ labs again reviewed

## 2011-07-12 NOTE — Assessment & Plan Note (Signed)
Wounds from vasculitis on legs/ feet/ buttock are healing with help of wound care center  Pain is improved and pt is very pleased

## 2011-07-12 NOTE — Telephone Encounter (Signed)
I am happy to extend her leave for a few more weeks - just get me any paperwork I need to do that -thanks

## 2011-07-12 NOTE — Assessment & Plan Note (Signed)
After slip and fall- with 2- cm hematoma over L eye  No headache or other symptoms  Re assuring neuro exam Disc s/s to watch for  Will continue cold compress on the hematoma

## 2011-07-12 NOTE — Assessment & Plan Note (Signed)
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

## 2011-07-12 NOTE — Assessment & Plan Note (Signed)
Stable at this time with cr 1.38 BUN remains high  No abd pain or GI symptoms and neg stool card  Suspect this is from her vasculitis  For eval from heme If no imp after further hydration and tx may need to see renal

## 2011-07-12 NOTE — Progress Notes (Signed)
Subjective:    Patient ID: Stephanie Raymond, female    DOB: [birth year], 56 y.o.   MRN: [id number]  HPI Here for f/u of several issues along with weakness and falls   Has HSP-- and still on low dose prednisone from Dr Lavenia Atlas with f/u planned   Wounds- taking care of at the wound care center at [facility] is down 13lb-- a lot of fluid  Is eating - but not as much as she used to  bp 118/60  Renal insuff stable with bun 41 and cr 1.38 Is drinking ice water - about 4 glasses per day   Anemia stable with hb 9.2  Had 2 transfusions when in hospital  Her ferritin is quite high- over 1000, and her iron levels are slightly low Nl retic ? If the anemia is 2ndary to her HSP or other cause such as renal insuff or GI losses The ferritin is confusing Did used to drink alcohol   Is still not drinking alcohol at all   Now weak and falling  Has fallen twice  The first time-walking to the kitchen-- before I saw her last time - slipped on fluid she was leaking  2nd fall (since I saw her last)  Got up at 2-3 am -thirsty and glass was empty -- slipped and fell and hit head on the table -- was wearing slippery socks -- 1 week ago  Is taking oxycodone currently for pain from legs - is not needing or taking as much  Feels really tired and -no change whatsoever  Gets up in the am and has breakfast and then goes back to bed for 1 hour She has to hold on to something to get up from a chair  Getting up and down the stairs is painful to her wounds   No headache or other symptoms from the bump on her head   Is off prednisone as of yesterday   No more swelling  Feet are bandaged so cannot wear regular shoes   Patient Active Problem List  Diagnoses  . HYPOTHYROIDISM  . History of alcohol abuse  . DEPRESSION  . HYPERTENSION  . GERD  . PSORIASIS  . BACK PAIN, LUMBAR  . CARPAL TUNNEL SYNDROME, HX OF  . TRANSIENT ISCHEMIC ATTACK, HX OF  . INTERTRIGO, CANDIDAL  . ENLARGEMENT OF LYMPH NODES    . Hyponatremia  . Thrombocytopenia  . Routine general medical examination at a health care facility  . Gynecological examination  . Other screening mammogram  . Former smoker  . Sore throat  . Renal insufficiency  . Anemia  . Hemorrhoids  . Gastroenteritis  . Weakness  . HSP (Henoch Schonlein purpura)  . Edema  . Wound, open, leg  . Fatigue  . Weakness generalized  . Falls frequently  . Head injury, closed   Past Medical History  Diagnosis Date  . Depression   . GERD (gastroesophageal reflux disease)   . Hypertension   . Hypothyroidism   . Hx of transient ischemic attack (TIA) 2000    Hosp ?  [facility]   . History of CT scan 04/1998    small lacunar infarcts  . History of MRI of brain and brain stem 07/1998    white matter changes  . Psoriasis     on humira   Past Surgical History  Procedure Date  . Cervical conization w/bx 1980's  . Carotid ultrasound 04/1998    neg  . Foot surgery  toe treated, as a child  . Cervical cone biopsy 02/1999    microinvasive cervical carcinoma  . Partial hysterectomy 02/1999   History  Substance Use Topics  . Smoking status: Former Smoker    Quit date: 11/03/2010  . Smokeless tobacco: Not on file  . Alcohol Use: Not on file   Family History  Problem Relation Age of Onset  . Emphysema Mother     smoker  . Heart disease Father 65    CABG  . Heart disease Other     MI in 20s  . Heart disease Other     CHF  . Hypertension Other    Allergies  Allergen Reactions  . Adalimumab Other (See Comments)    Could not walk.  . Diphenhydramine Hcl     REACTION: feel funny   Current Outpatient Prescriptions on File Prior to Visit  Medication Sig Dispense Refill  . buPROPion (WELLBUTRIN SR) 150 MG 12 hr tablet Take 2 tablets (300 mg total) by mouth daily.  180 tablet  3  . Cholecalciferol (VITAMIN D-3 PO) Take 1 capsule by mouth daily.        . cloNIDine (CATAPRES) 0.2 MG tablet Take 0.5 tablets (0.1 mg total)  by mouth daily.  45 tablet  3  . levothyroxine (SYNTHROID) 50 MCG tablet Take 1 tablet (50 mcg total) by mouth daily.  90 tablet  3  . losartan (COZAAR) 100 MG tablet Take 0.5 tablets (50 mg total) by mouth daily.  45 tablet  3  . omeprazole (PRILOSEC) 20 MG capsule Take 1 capsule (20 mg total) by mouth daily.  90 capsule  3  . oxycodone (OXY-IR) 5 MG capsule Take 5 mg by mouth every 6 (six) hours as needed.      . triamterene-hydrochlorothiazide (MAXZIDE-25) 37.5-25 MG per tablet Take 1 each (1 tablet total) by mouth daily.  90 tablet  3         Review of Systems Review of Systems  Constitutional: Negative for fever, appetite change, and unexpected weight change.pos for significant fatigue/ exhaustion  Eyes: Negative for pain and visual disturbance.  Respiratory: Negative for cough and shortness of breath.   Cardiovascular: Negative for cp or palpitations   pedal edema is much improved  Gastrointestinal: Negative for nausea, diarrhea and constipation.  Genitourinary: Negative for urgency and frequency.  Skin: Negative for pallor or rash   Neurological: Negative for weakness, light-headedness, numbness and headaches.  Hematological: Negative for adenopathy. Does not bruise/bleed easily.  Psychiatric/Behavioral: Negative for dysphoric mood. The patient is not nervous/anxious.         Objective:   Physical Exam  Constitutional: She appears well-developed and well-nourished. No distress.  HENT:  Head: Normocephalic.  Right Ear: External ear normal.  Left Ear: External ear normal.  Nose: Nose normal.  Mouth/Throat: Oropharynx is clear and moist.       Several cm hematoma- soft an tender over L eye  Ecchymoses under both eyes from that trauma No bony tenderness   Eyes: Conjunctivae and EOM are normal. Pupils are equal, round, and reactive to light. Right eye exhibits no discharge. Left eye exhibits no discharge. No scleral icterus.  Neck: Normal range of motion. Neck supple. No JVD  present. Carotid bruit is not present. No thyromegaly present.  Cardiovascular: Normal rate, regular rhythm, normal heart sounds and intact distal pulses.  Exam reveals no gallop.   Pulmonary/Chest: Effort normal and breath sounds normal. No respiratory distress. She has no wheezes.  Abdominal:  Soft. Bowel sounds are normal. She exhibits no distension, no abdominal bruit and no mass. There is no tenderness. There is no guarding.  Musculoskeletal: She exhibits edema. She exhibits no tenderness.       Trace edema in ankles- the wounds are wrapped today  Lymphadenopathy:    She has no cervical adenopathy.  Neurological: She is alert. She has normal reflexes. She displays no atrophy and no tremor. No cranial nerve deficit or sensory deficit. She exhibits normal muscle tone. Coordination normal.       Gait is somewhat unsteady and wide based  Rest of neuro exam is normal   Skin: Skin is warm and dry. No rash noted. No erythema. There is pallor.  Psychiatric: She has a normal mood and affect.       Pt is generally exhausted but in good spirits           Assessment & Plan:

## 2011-07-12 NOTE — Patient Instructions (Signed)
Increase water to 8 glasses per day or more - for health of your kidneys We will do referral to PT at check out - for strength and balance  See hematologist when scheduled for anemia -it looks stable  Keep cutting back on pain medicine as tolerated If headache or other symptoms from head injury let me know  Keep using cold compress on the knot on your head

## 2011-07-12 NOTE — Assessment & Plan Note (Signed)
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

## 2011-07-12 NOTE — Telephone Encounter (Signed)
I set up referral for Mrs. Stephanie Raymond for Physical Therapy in [place]. The quickest any one in [place] could see her was 07/20/11. Her FMLA will run out on 07/21/11 and she's not sure if she can handle all these appointments and go back to work all at the same time and she's going through alot. She wasn't sure if her FMLA should be extended or how she can work all this out.

## 2011-07-12 NOTE — Assessment & Plan Note (Signed)
Very tired- also anemic/ deconditioned and recovering from vasculitis / wounds  Labs rev in detail Disc diet/ nutrition and avoiding dehydration  For specialist f/u- rheum and heme

## 2011-07-13 ENCOUNTER — Telehealth: Payer: Self-pay | Admitting: Family Medicine

## 2011-07-13 NOTE — Telephone Encounter (Signed)
Pt came by to drop off a letter from her company for the [facility] extension. She says that her company asked if you could include a letter with the exact extension of dates and the office notes. I left the letter in your inbox out side your office.

## 2011-07-13 NOTE — Telephone Encounter (Signed)
Patient's spouse advised as instructed via telephone.  He will bring the paper work to our office.

## 2011-07-14 NOTE — Telephone Encounter (Signed)
Since she is starting PT on 5/30 , I am going to extend her leave 2 more weeks to return on June 13th-will see if she is ready by then  Make sure she wants them to have office notes and if so, she may have to sign a release when she comes to pick up the letter- and give [place] the fax number to send the office notes (I would send the last 2)

## 2011-07-14 NOTE — Telephone Encounter (Signed)
Last two office visit notes and letter from Dr. Milinda Antis faxed to Jerene Bears at [phone].  Patient advised via telephone.

## 2011-07-14 NOTE — Telephone Encounter (Signed)
Spoke with patients spouse and advised as instructed, he stated that fax number is provided on the [facility] paperwork and Zoria is aware that the notes go along with the paperwork.

## 2011-07-22 ENCOUNTER — Encounter: Payer: Self-pay | Admitting: Nurse Practitioner

## 2011-07-22 ENCOUNTER — Encounter: Payer: Self-pay | Admitting: Cardiothoracic Surgery

## 2011-07-24 ENCOUNTER — Ambulatory Visit: Payer: Self-pay | Admitting: Internal Medicine

## 2011-07-24 LAB — SEDIMENTATION RATE: Erythrocyte Sed Rate: >140 mm/hr — ABNORMAL HIGH (ref 0–30)

## 2011-07-24 LAB — FERRITIN: Ferritin (ARMC): 1266 ng/mL — ABNORMAL HIGH (ref 8–388)

## 2011-07-24 LAB — IRON AND TIBC
Iron Bind.Cap.(Total): 227 ug/dL — ABNORMAL LOW (ref 250–450)
Iron Saturation: 51 %
Iron: 116 ug/dL (ref 50–170)
Unbound Iron-Bind.Cap.: 111 ug/dL

## 2011-07-24 LAB — CBC CANCER CENTER
Basophil %: 0.3 %
Eosinophil #: 0 x10 3/mm (ref 0.0–0.7)
HGB: 9.1 g/dL — ABNORMAL LOW (ref 12.0–16.0)
MCHC: 33.6 g/dL (ref 32.0–36.0)
Monocyte #: 0.6 x10 3/mm (ref 0.2–0.9)
Monocyte %: 6.4 %
Neutrophil #: 7.7 x10 3/mm — ABNORMAL HIGH (ref 1.4–6.5)
Neutrophil %: 80.9 %
RDW: 16.4 % — ABNORMAL HIGH (ref 11.5–14.5)
WBC: 9.5 x10 3/mm (ref 3.6–11.0)

## 2011-07-24 LAB — RETICULOCYTES: Absolute Retic Count: 0.0485 10*6/uL (ref 0.024–0.084)

## 2011-07-24 LAB — CREATININE, SERUM
EGFR (African American): 33 — ABNORMAL LOW
EGFR (Non-African Amer.): 28 — ABNORMAL LOW

## 2011-07-24 LAB — LACTATE DEHYDROGENASE: LDH: 108 U/L (ref 84–246)

## 2011-07-26 ENCOUNTER — Ambulatory Visit: Payer: Self-pay | Admitting: Rheumatology

## 2011-07-28 ENCOUNTER — Encounter: Payer: Self-pay | Admitting: Family Medicine

## 2011-07-28 ENCOUNTER — Ambulatory Visit (INDEPENDENT_AMBULATORY_CARE_PROVIDER_SITE_OTHER): Payer: 59 | Admitting: Family Medicine

## 2011-07-28 ENCOUNTER — Encounter: Payer: Self-pay | Admitting: Gastroenterology

## 2011-07-28 VITALS — BP 92/58 | HR 92 | Temp 97.6°F | Ht 63.5 in | Wt 179.8 lb

## 2011-07-28 DIAGNOSIS — I10 Essential (primary) hypertension: Secondary | ICD-10-CM

## 2011-07-28 DIAGNOSIS — R531 Weakness: Secondary | ICD-10-CM

## 2011-07-28 DIAGNOSIS — D649 Anemia, unspecified: Secondary | ICD-10-CM

## 2011-07-28 DIAGNOSIS — D696 Thrombocytopenia, unspecified: Secondary | ICD-10-CM

## 2011-07-28 DIAGNOSIS — D690 Allergic purpura: Secondary | ICD-10-CM

## 2011-07-28 DIAGNOSIS — N289 Disorder of kidney and ureter, unspecified: Secondary | ICD-10-CM

## 2011-07-28 DIAGNOSIS — R5381 Other malaise: Secondary | ICD-10-CM

## 2011-07-28 NOTE — Patient Instructions (Addendum)
Stop the losartan (cozaar) because your blood pressure is low enough  Think about a bedside commode to prevent falls- let me know if you want me to write you a px  I will get this note and a work note to [employer]  Follow up with your specialists as planned

## 2011-07-28 NOTE — Progress Notes (Signed)
Subjective:    Patient ID: Stephanie Raymond, female    DOB: [birth year], 56 y.o.   MRN: [id number]  HPI Here for f/u of ongoing medical issues - with disability currently  HSP-- is off the prednisone now -- does not feel any different   Still has no energy and is generally miserable  Keeps falling - especially at night - usually happens when she gets up at night - to go to the bathroom  Went to PT one time so far - was too tired to do it   Needs to extend her disability beyond June 13th    Wounds- are healing - and still going to the wound care center for that    Anemia/ thrombocytopenia - saw the hematologist -- drew a lot of blood - and told he could treat her for the anemia  Waiting to hear back   Renal insuff- was ref for renal - and appt was in July  Is set up in [place]   Also ref for GI - has appt upcoming for her anemia   bp is 92/58  Wt is up 3 lb   Will drop paperwork off for refils   Patient Active Problem List  Diagnoses  . HYPOTHYROIDISM  . History of alcohol abuse  . DEPRESSION  . HYPERTENSION  . GERD  . PSORIASIS  . BACK PAIN, LUMBAR  . CARPAL TUNNEL SYNDROME, HX OF  . TRANSIENT ISCHEMIC ATTACK, HX OF  . INTERTRIGO, CANDIDAL  . ENLARGEMENT OF LYMPH NODES  . Hyponatremia  . Thrombocytopenia  . Routine general medical examination at a health care facility  . Gynecological examination  . Other screening mammogram  . Former smoker  . Sore throat  . Renal insufficiency  . Anemia  . Hemorrhoids  . Gastroenteritis  . Weakness  . HSP (Henoch Schonlein purpura)  . Edema  . Wound, open, leg  . Fatigue  . Weakness generalized  . Falls frequently  . Head injury, closed   Past Medical History  Diagnosis Date  . Depression   . GERD (gastroesophageal reflux disease)   . Hypertension   . Hypothyroidism   . Hx of transient ischemic attack (TIA) 2000    Hosp ?  [facility], female   . History of CT scan 04/1998    small lacunar infarcts  .  History of MRI of brain and brain stem 07/1998    white matter changes  . Psoriasis     on humira   Past Surgical History  Procedure Date  . Cervical conization w/bx 1980's  . Carotid ultrasound 04/1998    neg  . Foot surgery     toe treated, as a child  . Cervical cone biopsy 02/1999    microinvasive cervical carcinoma  . Partial hysterectomy 02/1999   History  Substance Use Topics  . Smoking status: Former Smoker    Quit date: 11/03/2010  . Smokeless tobacco: Not on file  . Alcohol Use: Not on file   Family History  Problem Relation Age of Onset  . Emphysema Mother     smoker  . Heart disease Father 82    CABG  . Heart disease Other     MI in 69s  . Heart disease Other     CHF  . Hypertension Other    Allergies  Allergen Reactions  . Adalimumab Other (See Comments)    Could not walk.  . Diphenhydramine Hcl     REACTION: feel funny  Current Outpatient Prescriptions on File Prior to Visit  Medication Sig Dispense Refill  . buPROPion (WELLBUTRIN SR) 150 MG 12 hr tablet Take 2 tablets (300 mg total) by mouth daily.  180 tablet  3  . Cholecalciferol (VITAMIN D-3 PO) Take 1 capsule by mouth daily.        . cloNIDine (CATAPRES) 0.2 MG tablet Take 0.5 tablets (0.1 mg total) by mouth daily.  45 tablet  3  . levothyroxine (SYNTHROID) 50 MCG tablet Take 1 tablet (50 mcg total) by mouth daily.  90 tablet  3  . omeprazole (PRILOSEC) 20 MG capsule Take 1 capsule (20 mg total) by mouth daily.  90 capsule  3  . triamterene-hydrochlorothiazide (MAXZIDE-25) 37.5-25 MG per tablet Take 1 each (1 tablet total) by mouth daily.  90 tablet  3  . oxycodone (OXY-IR) 5 MG capsule Take 5 mg by mouth every 6 (six) hours as needed.         Review of Systems Review of Systems  Constitutional: Negative for fever, appetite change,and unexpected weight change. pos for fatigue and generalized malaise Eyes: Negative for pain and visual disturbance.  Respiratory: Negative for cough and  shortness of breath.   Cardiovascular: Negative for cp or palpitations    Gastrointestinal: Negative for nausea, diarrhea and constipation.  Genitourinary: Negative for urgency and frequency.  Skin: Negative for pallor or rash  pos for pain from her leg wounds- they are improving  Neurological: Negative for weakness, light-headedness, numbness and headaches.  Hematological: Negative for adenopathy. Does not bruise/bleed easily.  Psychiatric/Behavioral: Negative for dysphoric mood. The patient is not nervous/anxious.         Objective:   Physical Exam  Constitutional: She appears well-developed and well-nourished. No distress.       Fatigued and chronically ill appearing female - with old ecchymosis over her L eye from a fall   HENT:  Head: Normocephalic.  Mouth/Throat: Oropharynx is clear and moist.       Ecchymosis over L eye from a fall   Eyes: Conjunctivae and EOM are normal. Pupils are equal, round, and reactive to light. Right eye exhibits no discharge. Left eye exhibits no discharge. No scleral icterus.  Neck: Normal range of motion. Neck supple. No JVD present. No thyromegaly present.  Cardiovascular: Normal rate, regular rhythm and normal heart sounds.   Pulmonary/Chest: Effort normal and breath sounds normal. No respiratory distress. She has no wheezes.  Abdominal: Soft. Bowel sounds are normal. She exhibits no distension. There is no tenderness.  Musculoskeletal: Normal range of motion.       Legs/ ankles are wrapped bilaterally  Lymphadenopathy:    She has no cervical adenopathy.  Neurological: She is alert. She has normal reflexes. No cranial nerve deficit. She exhibits normal muscle tone. Coordination normal.       Tremor is improved   Skin: Skin is warm and dry. There is pallor.       Sallow complexion  Psychiatric:       Pt is very very frustrated from her situation , but not tearful            Assessment & Plan:

## 2011-07-31 ENCOUNTER — Encounter: Payer: Self-pay | Admitting: Family Medicine

## 2011-07-31 ENCOUNTER — Other Ambulatory Visit: Payer: Self-pay | Admitting: *Deleted

## 2011-07-31 ENCOUNTER — Encounter: Payer: Self-pay | Admitting: Gastroenterology

## 2011-07-31 ENCOUNTER — Telehealth: Payer: Self-pay | Admitting: Gastroenterology

## 2011-07-31 ENCOUNTER — Ambulatory Visit (INDEPENDENT_AMBULATORY_CARE_PROVIDER_SITE_OTHER): Payer: 59 | Admitting: Gastroenterology

## 2011-07-31 ENCOUNTER — Other Ambulatory Visit: Payer: 59

## 2011-07-31 VITALS — BP 90/60 | HR 64 | Ht 63.0 in | Wt 176.6 lb

## 2011-07-31 DIAGNOSIS — D690 Allergic purpura: Secondary | ICD-10-CM

## 2011-07-31 DIAGNOSIS — K7460 Unspecified cirrhosis of liver: Secondary | ICD-10-CM

## 2011-07-31 MED ORDER — FUROSEMIDE 80 MG PO TABS: 80.0000 mg | ORAL_TABLET | Freq: Two times a day (BID) | ORAL | Status: DC

## 2011-07-31 MED ORDER — HYDROCODONE-ACETAMINOPHEN 7.5-325 MG PO TABS: 1.0000 | ORAL_TABLET | Freq: Four times a day (QID) | ORAL | Status: DC | PRN

## 2011-07-31 MED ORDER — DOCUSATE SODIUM 100 MG PO CAPS: 100.0000 mg | ORAL_CAPSULE | Freq: Two times a day (BID) | ORAL | Status: DC

## 2011-07-31 NOTE — Assessment & Plan Note (Signed)
I suspect causing a lot of her fatigue , and is in the midst of hematology work up

## 2011-07-31 NOTE — Patient Instructions (Signed)
Your Endoscopy is scheduled on 08/03/2011 at 11am Separate instructions have been given You will go to the basement for labs today  Upper GI Endoscopy Upper GI endoscopy means using a flexible scope to look at the esophagus, stomach, and upper small bowel. This is done to make a diagnosis in people with heartburn, abdominal pain, or abnormal bleeding. Sometimes an endoscope is needed to remove foreign bodies or food that become stuck in the esophagus; it can also be used to take biopsy samples. For the best results, do not eat or drink for 8 hours before having your upper endoscopy.  To perform the endoscopy, you will probably be sedated and your throat will be numbed with a special spray. The endoscope is then slowly passed down your throat (this will not interfere with your breathing). An endoscopy exam takes 15 to 30 minutes to complete and there is no real pain. Patients rarely remember much about the procedure. The results of the test may take several days if a biopsy or other test is taken.  You may have a sore throat after an endoscopy exam. Serious complications are very rare. Stick to liquids and soft foods until your pain is better. Do not drive a car or operate any dangerous equipment for at least 24 hours after being sedated. SEEK IMMEDIATE MEDICAL CARE IF:   You have severe throat pain.   You have shortness of breath.   You have bleeding problems.   You have a fever.   You have difficulty recovering from your sedation.  Document Released: 03/16/2004 Document Revised: 01/26/2011 Document Reviewed: 02/09/2008 [facility] Patient Information 2012 [place], [place].

## 2011-07-31 NOTE — Telephone Encounter (Signed)
Rx called in as directed.   

## 2011-07-31 NOTE — Progress Notes (Signed)
History of Present Illness: Ms. 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 Milinda Antis for evaluation of liver abnormalities.  Ultrasound at [facility] in September, 2012 demonstrated cholelithiasis and hepatosplenomegaly.  She admits to  heavy alcohol use until about 2 years ago, taking 2-4 drinks nightly. She now has a couple of drinks several times a week. There is no history of hepatitis or jaundice. She injected drugs about 40 years ago. She apparently tested hepatitis C antibody positive. Recent lab work included an albumin 3.4 and normal INR and liver tests. Her main complaint is fatigue. 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     Past Medical History  Diagnosis Date  . Depression   . GERD (gastroesophageal reflux disease)   . Hypertension   . Hypothyroidism   . Hx of transient ischemic attack (TIA) 2000    Hosp ?  [facility], female   . History of CT scan 04/1998    small lacunar infarcts  . History of MRI of brain and brain stem 07/1998    white matter changes  . Psoriasis     on humira  . Vasculitis     h/o Henoch-Scholein Purpura   Past Surgical History  Procedure Date  . Cervical conization w/bx 1980's  . Carotid ultrasound 04/1998    neg  . Foot surgery     toe treated, as a child  . Cervical cone biopsy 02/1999    microinvasive cervical carcinoma  . Partial hysterectomy 02/1999   family history includes Emphysema in her mother; Heart disease in her others; Heart disease (age of onset:67) in her father; and Hypertension in her other. Current Outpatient Prescriptions  Medication Sig Dispense Refill  . buPROPion (WELLBUTRIN SR) 150 MG 12 hr tablet Take 2 tablets (300 mg total) by mouth daily.  180 tablet  3  . Cholecalciferol (VITAMIN  D-3 PO) Take 1 capsule by mouth daily.       Mar[facility] Kitchen docusate sodium (COLACE) 100 MG capsule Take 100 mg by mouth 2 (two) times daily.      . furosemide (LASIX) 80 MG tablet Take 80 mg by mouth 2 (two) times daily.      [facility] HYDROcodone-acetaminophen (NORCO) 7.5-325 MG per tablet Take 1 tablet by mouth every 6 (six) hours as needed.      [facility] levothyroxine (SYNTHROID) 50 MCG tablet Take 1 tablet (50 mcg total) by mouth daily.  90 tablet  3  . losartan (COZAAR) 100 MG tablet Take 100 mg by mouth daily.      . magnesium oxide (MAG-OX) 400 MG tablet Take 400 mg by mouth daily.      [facility] Kitchen omeprazole (PRILOSEC) 20 MG capsule Take 1 capsule (20 mg total) by mouth daily.  90 capsule  3  . triamterene-hydrochlorothiazide (MAXZIDE-25) 37.5-25 MG per tablet Take 1 each (1 tablet total) by mouth daily.  90 tablet  3   Allergies as of 07/31/2011 - Review Complete 07/31/2011  Allergen Reaction Noted  . Adalimumab Other (See Comments) 02/28/2011  . Diphenhydramine hcl  02/04/2007    reports that she quit smoking about 8 months ago. She has never used smokeless tobacco. She reports that she drinks alcohol. She reports that she does not use illicit drugs.     Review of Systems: She has loss of  hearing and unsteadiness of gait due to lower extremity weakness. Pertinent positive and negative review of systems were noted in the above HPI section. All other review of systems were otherwise negative.  Vital signs were reviewed in today's medical record Physical Exam: General: She is a chronically ill-appearing female Head: Normocephalic and atraumatic Eyes:  sclerae anicteric, EOMI Ears: Normal auditory acuity Mouth: No deformity or lesions Neck: Supple, no masses or thyromegaly Lungs: There are diffuse expiratory wheezes and rhonchi Heart: Regular rate and rhythm; no murmurs, rubs or bruits Abdomen: Soft, non tender and non distended. No masses, hepatosplenomegaly or hernias noted. Normal Bowel sounds; there is a  prominent venous pattern. Liver is palpable 8 fingerbreadths below the right costal margin and percusses to over 15 cm. Spleen tip is not palpable. Rectal:deferred Musculoskeletal: Symmetrical with no gross deformities  Skin: No lesions on visible extremities; there are telangiectasias on her face Pulses:  Normal pulses noted Extremities: No clubbing, cyanosis, edema or deformities noted; legs are wrapped in bandages. There are patches of psoriasis. Neurological: Alert oriented x 4, grossly nonfocal Cervical Nodes:  No significant cervical adenopathy Inguinal Nodes: No significant inguinal adenopathy Psychological:  Alert and cooperative. Normal mood and affect

## 2011-07-31 NOTE — Assessment & Plan Note (Signed)
Now off prednisone and undergoing specialist workups for anemia/ renal insuff  She is very frustrated and not back to work yet Will continue f/u with Dr Gavin Potters who is treating her

## 2011-07-31 NOTE — Telephone Encounter (Signed)
Ok to refill 

## 2011-07-31 NOTE — Assessment & Plan Note (Signed)
Per Dr. Kernodle 

## 2011-07-31 NOTE — Assessment & Plan Note (Signed)
In midst of heme work up for this  Pt had prior alcohol use, and recent vasculitis HSP

## 2011-07-31 NOTE — Telephone Encounter (Signed)
Returned call, put envelope out front in folder for patient will pick up on Thursday

## 2011-07-31 NOTE — Telephone Encounter (Signed)
Patient's husband returned the call to Selena Batten and said that the fax number for the 90 day med supply company is [phone]

## 2011-07-31 NOTE — Telephone Encounter (Signed)
Gene calling for the number for [facility] Disability.

## 2011-07-31 NOTE — Assessment & Plan Note (Signed)
She has marked hepatomegaly by physical exam and splenomegaly on ultrasound.  Lab work was pertinent for a macrocytic anemia and thrombocytopenia. I suspect she has cirrhosis, probably on the basis of alcohol use. It is noteworthy that she currently is hepatitis C antibody positive and has a history of IV drug use. Thrombocytopenia and splenomegaly may be reflective of portal hypertension.  Iron studies are pertinent for an elevated ferritin level and low normal percent iron saturation.  Recommendations #1 check serologies for hepatitis A, B, and C, antimitochondrial antibody, antinuclear antibody, alpha-fetoprotein and alpha-1 antitrypsin levels, ceruloplasmin level. #2 vaccinations pending #1 #3 upper endoscopy to rule out varices

## 2011-07-31 NOTE — Assessment & Plan Note (Signed)
bp continues to dip - worried this could add to the falls Adv to hold her cozzar at this time and report back if no improvement

## 2011-07-31 NOTE — Telephone Encounter (Signed)
Px written for call in   

## 2011-07-31 NOTE — Assessment & Plan Note (Signed)
Suspect related to her vasculitis and may contrib to anemia  Was ref to renal - that is upcoming

## 2011-07-31 NOTE — Assessment & Plan Note (Signed)
This persists I suspect from her anemia and renal insuff and vasculitis  Pt states she is not strong enough to do the PT that was px , offered to refer her home PT and still declined She has had recurrent falls at night getting up to go to the bathroom- I offered px for bedside commode and she declined that  I think her fatigue is multi factorial- and working up / treating her anemia will be important, she is waiting to hear back from hematology at this time and also has appts set up with renal and GI She is very very frustrated over the situation  Also needs extension of work leave, unsure when she will feel well enough to return

## 2011-08-01 LAB — HEPATITIS A ANTIBODY, TOTAL: Hep A Total Ab: NEGATIVE

## 2011-08-01 LAB — HEPATITIS PANEL, ACUTE
Hep B C IgM: NEGATIVE
Hepatitis B Surface Ag: NEGATIVE

## 2011-08-01 LAB — ALPHA-1-ANTITRYPSIN: A-1 Antitrypsin, Ser: 309 mg/dL — ABNORMAL HIGH (ref 90–200)

## 2011-08-02 ENCOUNTER — Telehealth: Payer: Self-pay | Admitting: Family Medicine

## 2011-08-02 LAB — ANA: Anti Nuclear Antibody(ANA): NEGATIVE

## 2011-08-02 NOTE — Telephone Encounter (Signed)
Patient asked for Dr.Tower to call her.  Patient has a question about her office visit last week and her disability.

## 2011-08-02 NOTE — Telephone Encounter (Signed)
Contacted [employer] today to have them re-fax the paperwork since page # 2 was blank. I was told typical turnaround was 24-48 hours to re-fax. I advised that was unacceptable as the paperwork is due in their office for this patient tomorrow. I advised it cannot be completed without page # 2. I asked that the paperwork be expedited. She said she would send the request to the "specialist" to see if things could be expedited for the patient. Phyllis and Dr. Milinda Antis advised. Will await paperwork.

## 2011-08-02 NOTE — Telephone Encounter (Signed)
I still do not have the paperwork and it is 5:20 pm and I will be leaving town.  Please document any lateness of completion of required paperwork is thus the fault of the company and not of ours. I will complete the paperwork I have without page 2 - and hope they can deal with that.  If the patient calls you can let them know that the [employer] company was not cooperative with [place]. I will put paperwork in the IN box

## 2011-08-02 NOTE — Telephone Encounter (Signed)
Pt is needing a letter sent in to [employer] that includes a return date to work if possible.

## 2011-08-02 NOTE — Telephone Encounter (Signed)
I'm not really sure when she will feel well enough to go to work- so will estimate 2 weeks at least for now  Will write letter By the way- they sent me some sort of disability paperwork yesterday - one page was blank so we asked them to re fax  Bonita Quin and Selena Batten were aware I think)  I really need that today before I leave town Thanks

## 2011-08-03 ENCOUNTER — Encounter: Payer: Self-pay | Admitting: Gastroenterology

## 2011-08-03 ENCOUNTER — Telehealth: Payer: Self-pay | Admitting: *Deleted

## 2011-08-03 ENCOUNTER — Other Ambulatory Visit: Payer: Self-pay | Admitting: *Deleted

## 2011-08-03 ENCOUNTER — Other Ambulatory Visit: Payer: 59

## 2011-08-03 ENCOUNTER — Ambulatory Visit (AMBULATORY_SURGERY_CENTER): Payer: 59 | Admitting: Gastroenterology

## 2011-08-03 VITALS — BP 105/66 | HR 99 | Temp 98.8°F | Resp 18 | Ht 63.0 in | Wt 176.0 lb

## 2011-08-03 DIAGNOSIS — D131 Benign neoplasm of stomach: Secondary | ICD-10-CM

## 2011-08-03 DIAGNOSIS — K7460 Unspecified cirrhosis of liver: Secondary | ICD-10-CM

## 2011-08-03 MED ORDER — SODIUM CHLORIDE 0.9 % IV SOLN: 500.0000 mL | INTRAVENOUS | Status: DC

## 2011-08-03 NOTE — Telephone Encounter (Signed)
Received document from Rene Kocher that stated the information/form was completed and faxed back to the company yesterday. The patient's husband was in the office today for another reason and said that a nurse is now involved in reviewing.  I asked him if I could assist with anything further and he said no and that there is a nurse involved in the review now.

## 2011-08-03 NOTE — Patient Instructions (Addendum)
Impressions/recommendations:  Multiple polyps in the fundus  Repeat EGD in 1 year.  YOU HAD AN ENDOSCOPIC PROCEDURE TODAY AT THE [facility]: Refer to the procedure report that was given to you for any specific questions about what was found during the examination.  If the procedure report does not answer your questions, please call your gastroenterologist to clarify.  If you requested that your care partner not be given the details of your procedure findings, then the procedure report has been included in a sealed envelope for you to review at your convenience later.  YOU SHOULD EXPECT: Some feelings of bloating in the abdomen. Passage of more gas than usual.  Walking can help get rid of the air that was put into your GI tract during the procedure and reduce the bloating. If you had a lower endoscopy (such as a colonoscopy or flexible sigmoidoscopy) you may notice spotting of blood in your stool or on the toilet paper. If you underwent a bowel prep for your procedure, then you may not have a normal bowel movement for a few days.  DIET: Your first meal following the procedure should be a light meal and then it is ok to progress to your normal diet.  A half-sandwich or bowl of soup is an example of a good first meal.  Heavy or fried foods are harder to digest and may make you feel nauseous or bloated.  Likewise meals heavy in dairy and vegetables can cause extra gas to form and this can also increase the bloating.  Drink plenty of fluids but you should avoid alcoholic beverages for 24 hours.  ACTIVITY: Your care partner should take you home directly after the procedure.  You should plan to take it easy, moving slowly for the rest of the day.  You can resume normal activity the day after the procedure however you should NOT DRIVE or use heavy machinery for 24 hours (because of the sedation medicines used during the test).    SYMPTOMS TO REPORT IMMEDIATELY: A gastroenterologist can be  reached at any hour.  During normal business hours, 8:30 AM to 5:00 PM Monday through Friday, call [phone].  After hours and on weekends, please call the GI answering service at [phone] who will take a message and have the physician on call contact you.   Following lower endoscopy (colonoscopy or flexible sigmoidoscopy):  Excessive amounts of blood in the stool  Significant tenderness or worsening of abdominal pains  Swelling of the abdomen that is new, acute  Fever of 100F or higher  Following upper endoscopy (EGD)  Vomiting of blood or coffee ground material  New chest pain or pain under the shoulder blades  Painful or persistently difficult swallowing  New shortness of breath  Fever of 100F or higher  Black, tarry-looking stools  FOLLOW UP: If any biopsies were taken you will be contacted by phone or by letter within the next 1-3 weeks.  Call your gastroenterologist if you have not heard about the biopsies in 3 weeks.  Our staff will call the home number listed on your records the next business day following your procedure to check on you and address any questions or concerns that you may have at that time regarding the information given to you following your procedure. This is a courtesy call and so if there is no answer at the home number and we have not heard from you through the emergency physician on call, we will assume that you have  returned to your regular daily activities without incident.  SIGNATURES/CONFIDENTIALITY: You and/or your care partner have signed paperwork which will be entered into your electronic medical record.  These signatures attest to the fact that that the information above on your After Visit Summary has been reviewed and is understood.  Full responsibility of the confidentiality of this discharge information lies with you and/or your care-partner.

## 2011-08-03 NOTE — Progress Notes (Signed)
Patient did not experience any of the following events: a burn prior to discharge; a fall within the facility; wrong site/side/patient/procedure/implant event; or a hospital transfer or hospital admission upon discharge from the facility. (G8907) Patient did not have preoperative order for IV antibiotic SSI prophylaxis. (G8918)  

## 2011-08-03 NOTE — Progress Notes (Signed)
  Subjective:    Patient ID: Stephanie Raymond, female    DOB: 03/03/1955, 56 y.o.   MRN: [id number]  HPI    Review of Systems     Objective:   Physical Exam        Assessment & Plan:

## 2011-08-03 NOTE — Progress Notes (Signed)
Hep B AB positive Hep C antibody positive Hep A antibody negative  Pt needs HCV quantitative (already ordered) and Hep A vaccine.

## 2011-08-03 NOTE — Op Note (Signed)
 [facility] [address] [place], [place]  [address]  ENDOSCOPY PROCEDURE REPORT  PATIENT:  Stephanie, Raymond  MR#:  [id number] BIRTHDATE:  16-Jan-1956, 55 yrs. old  GENDER:  female  ENDOSCOPIST:  Barbette Hair. Arlyce Dice, MD Referred by:  Marne A. Milinda Antis, M.D.  PROCEDURE DATE:  08/03/2011 PROCEDURE:  EGD, diagnostic 43235 ASA CLASS:  Class III INDICATIONS:  Known esophageal varices, for surveillance exam. Cirrhosis  MEDICATIONS:   MAC sedation, administered by CRNA propofol 100mg IV, glycopyrrolate (Robinal) 0.2 mg IV, 0.6cc simethancone 0.6 cc PO TOPICAL ANESTHETIC:  DESCRIPTION OF PROCEDURE:   After the risks and benefits of the procedure were explained, informed consent was obtained.  The [facility] [id number]-H180 [id number] endoscope was introduced through the mouth and advanced to the third portion of the duodenum.  The instrument was slowly withdrawn as the mucosa was fully examined. <<PROCEDUREIMAGES>>  There were multiple polyps identified. in the fundus (see image3). At least 2 sessile 1-73mm fundic appearing polyps  Otherwise the examination was normal (see image2, image4, image1, and image5). Retroflexed views revealed no abnormalities.    The scope was then withdrawn from the patient and the procedure completed.  COMPLICATIONS:  None  ENDOSCOPIC IMPRESSION: 1) Polyps, multiple in the fundus 2) Otherwise normal examination RECOMMENDATIONS:EGD 1 year  ______________________________ Barbette Hair. Arlyce Dice, MD  CC:  n. eSIGNED:   Barbette Hair. Flynn Lininger at 08/03/2011 11:41 AM  Edgardo Roys, [phone]

## 2011-08-03 NOTE — Telephone Encounter (Signed)
Message copied by Marlowe Kays on Thu Aug 03, 2011 11:21 AM ------      Message from: Melvia Heaps D      Created: Wed Aug 02, 2011  8:49 AM       She needs HCV quantitative study

## 2011-08-03 NOTE — Telephone Encounter (Signed)
IS IT THE HCV RNA QUANTITATIVE?? THAT IS THE ORDER I OUT IN WANT TO BE SURE IT IS CORRECT

## 2011-08-03 NOTE — Telephone Encounter (Signed)
yes

## 2011-08-04 ENCOUNTER — Telehealth: Payer: Self-pay

## 2011-08-04 ENCOUNTER — Telehealth: Payer: Self-pay | Admitting: *Deleted

## 2011-08-04 NOTE — Telephone Encounter (Signed)
Melvia Heaps, MD 08/03/2011 11:44 AM Signed  Hep B AB positive  Hep C antibody positive  Hep A antibody negative  Pt needs HCV quantitative (already ordered) and Hep A vaccine.  Pt scheduled for Hep A vaccine for Monday 08/07/11@12  noon. Pt aware of appt date and time.

## 2011-08-04 NOTE — Telephone Encounter (Signed)
  Follow up Call-  Call back number 08/03/2011  Post procedure Call Back phone  # [phone]  Permission to leave phone message Yes     Patient questions:  Do you have a fever, pain , or abdominal swelling? no Pain Score  0 *  Have you tolerated food without any problems? yes  Have you been able to return to your normal activities? yes  Do you have any questions about your discharge instructions: Diet   no Medications  no Follow up visit  no  Do you have questions or concerns about your Care? no  Actions: * If pain score is 4 or above: No action needed, pain <4.

## 2011-08-07 ENCOUNTER — Ambulatory Visit (INDEPENDENT_AMBULATORY_CARE_PROVIDER_SITE_OTHER): Payer: 59 | Admitting: Gastroenterology

## 2011-08-07 DIAGNOSIS — K7460 Unspecified cirrhosis of liver: Secondary | ICD-10-CM

## 2011-08-07 LAB — CBC CANCER CENTER
Basophil #: 0 x10 3/mm (ref 0.0–0.1)
Basophil %: 0.4 %
Eosinophil #: 0 x10 3/mm (ref 0.0–0.7)
Eosinophil %: 0 %
HCT: 28.1 % — ABNORMAL LOW (ref 35.0–47.0)
HGB: 9.2 g/dL — ABNORMAL LOW (ref 12.0–16.0)
Lymphocyte #: 1.8 x10 3/mm (ref 1.0–3.6)
Lymphocyte %: 21.8 %
MCH: 33.9 pg (ref 26.0–34.0)
MCHC: 32.9 g/dL (ref 32.0–36.0)
MCV: 103 fL — ABNORMAL HIGH (ref 80–100)
Monocyte #: 0.4 x10 3/mm (ref 0.2–0.9)
RDW: 15.5 % — ABNORMAL HIGH (ref 11.5–14.5)
WBC: 8.1 x10 3/mm (ref 3.6–11.0)

## 2011-08-08 ENCOUNTER — Telehealth: Payer: Self-pay | Admitting: Family Medicine

## 2011-08-08 LAB — HEPATITIS C RNA QUANTITATIVE: HCV Quantitative: NOT DETECTED IU/mL (ref <43)

## 2011-08-08 NOTE — Telephone Encounter (Signed)
Caller: Stephanie Raymond/Patient; PCP: Stephanie Manns A.; CB#: [phone]; Call regarding Fatique; She has not felt good for over a year and prior to that time she was very healthy. She is requesting bloodwork to test for Poisoning. Triage and Care advice per Fatigue Protocol and appnt advised within 24 hours for "persistant fatigue that came on suddenly without known cause". Appnt scheduled with Dr. Dallas Schimke for 1115- 08/08/11.

## 2011-08-09 ENCOUNTER — Encounter: Payer: Self-pay | Admitting: Family Medicine

## 2011-08-09 ENCOUNTER — Ambulatory Visit (INDEPENDENT_AMBULATORY_CARE_PROVIDER_SITE_OTHER): Payer: 59 | Admitting: Family Medicine

## 2011-08-09 VITALS — BP 130/60 | HR 97 | Temp 98.9°F | Ht 63.0 in | Wt 176.5 lb

## 2011-08-09 DIAGNOSIS — R531 Weakness: Secondary | ICD-10-CM

## 2011-08-09 DIAGNOSIS — N289 Disorder of kidney and ureter, unspecified: Secondary | ICD-10-CM

## 2011-08-09 DIAGNOSIS — K7460 Unspecified cirrhosis of liver: Secondary | ICD-10-CM

## 2011-08-09 DIAGNOSIS — D649 Anemia, unspecified: Secondary | ICD-10-CM

## 2011-08-09 DIAGNOSIS — E039 Hypothyroidism, unspecified: Secondary | ICD-10-CM

## 2011-08-09 DIAGNOSIS — D690 Allergic purpura: Secondary | ICD-10-CM

## 2011-08-09 DIAGNOSIS — F1011 Alcohol abuse, in remission: Secondary | ICD-10-CM

## 2011-08-09 DIAGNOSIS — D696 Thrombocytopenia, unspecified: Secondary | ICD-10-CM

## 2011-08-09 DIAGNOSIS — E871 Hypo-osmolality and hyponatremia: Secondary | ICD-10-CM

## 2011-08-09 DIAGNOSIS — R5381 Other malaise: Secondary | ICD-10-CM
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
## 2011-08-10 LAB — DRUG SCREEN, URINE
Opiates: POSITIVE ng/mL
Phencyclidine: NEGATIVE ng/mL

## 2011-08-10 LAB — SPECIMEN STATUS REPORT

## 2011-08-11 LAB — HEAVY METALS, BLOOD
Arsenic: 7 ug/L (ref 2–23)
Mercury: NOT DETECTED ug/L (ref 0.0–14.9)

## 2011-08-18 ENCOUNTER — Other Ambulatory Visit: Payer: Self-pay

## 2011-08-18 NOTE — Telephone Encounter (Signed)
pts husband request colace refill. Advised sent to [facility] 07/31/11. Pts husband will ck with optum.

## 2011-08-21 ENCOUNTER — Encounter: Payer: Self-pay | Admitting: Cardiothoracic Surgery

## 2011-08-21 ENCOUNTER — Ambulatory Visit: Payer: Self-pay | Admitting: Internal Medicine

## 2011-08-21 ENCOUNTER — Encounter: Payer: Self-pay | Admitting: Nurse Practitioner

## 2011-08-22 ENCOUNTER — Telehealth: Payer: Self-pay | Admitting: Family Medicine

## 2011-08-22 DIAGNOSIS — R296 Repeated falls: Secondary | ICD-10-CM

## 2011-08-22 DIAGNOSIS — R531 Weakness: Secondary | ICD-10-CM

## 2011-08-22 DIAGNOSIS — R5383 Other fatigue: Secondary | ICD-10-CM

## 2011-08-22 NOTE — Telephone Encounter (Signed)
I can do another order just in case

## 2011-08-22 NOTE — Telephone Encounter (Signed)
Pt called in and wanted to go back to physical therapy now that she is able to do so. She was going to [facility]. I wasn't sure if a new order needs to be put in or if I could just call and set her up???

## 2011-08-28 LAB — CBC CANCER CENTER
Eosinophil #: 0 x10 3/mm (ref 0.0–0.7)
Eosinophil %: 0 %
HCT: 31.6 % — ABNORMAL LOW (ref 35.0–47.0)
HGB: 10.4 g/dL — ABNORMAL LOW (ref 12.0–16.0)
Lymphocyte #: 1.5 x10 3/mm (ref 1.0–3.6)
MCH: 34.5 pg — ABNORMAL HIGH (ref 26.0–34.0)
MCHC: 32.9 g/dL (ref 32.0–36.0)
MCV: 105 fL — ABNORMAL HIGH (ref 80–100)
Monocyte #: 0.6 x10 3/mm (ref 0.2–0.9)
Monocyte %: 7.6 %
Neutrophil #: 5.6 x10 3/mm (ref 1.4–6.5)
Platelet: 119 x10 3/mm — ABNORMAL LOW (ref 150–440)
RDW: 15.5 % — ABNORMAL HIGH (ref 11.5–14.5)

## 2011-09-04 ENCOUNTER — Telehealth: Payer: Self-pay | Admitting: Gastroenterology

## 2011-09-04 NOTE — Telephone Encounter (Signed)
Reviewed pts chart with pt and pt is not due for any lab work at this time for Dr. Arlyce Dice.

## 2011-09-04 NOTE — Telephone Encounter (Signed)
When patient was last here she was under the impression that all was fine but she was given lab orders.  She is asking for clarification.

## 2011-09-05 ENCOUNTER — Ambulatory Visit: Payer: Self-pay | Admitting: Gastroenterology

## 2011-09-19 ENCOUNTER — Ambulatory Visit: Payer: Self-pay | Admitting: Gastroenterology

## 2011-09-21 ENCOUNTER — Encounter: Payer: Self-pay | Admitting: Nurse Practitioner

## 2011-09-21 ENCOUNTER — Encounter: Payer: Self-pay | Admitting: Cardiothoracic Surgery

## 2011-09-21 ENCOUNTER — Ambulatory Visit: Payer: Self-pay | Admitting: Internal Medicine

## 2011-09-22 LAB — CANCER CENTER HEMOGLOBIN: HGB: 10.3 g/dL — ABNORMAL LOW (ref 12.0–16.0)

## 2011-10-02 ENCOUNTER — Telehealth: Payer: Self-pay | Admitting: Family Medicine

## 2011-10-02 MED ORDER — PROMETHAZINE HCL 25 MG PO TABS: 25.0000 mg | ORAL_TABLET | Freq: Three times a day (TID) | ORAL | Status: DC | PRN

## 2011-10-02 NOTE — Telephone Encounter (Signed)
Patient advised.

## 2011-10-02 NOTE — Telephone Encounter (Signed)
Pt is calling and is saying she has had Diarrhea for 3 days and needs something for Nausea [facility] [place]. She is extremely sick and was hoping that this would be called in today and would liek a call when it is sent in.

## 2011-10-02 NOTE — Telephone Encounter (Signed)
See other phone note - I sent phenergan to pharmacy- will see in am , but if worse tonight needs to go to ER in light of prev hx of colitis

## 2011-10-02 NOTE — Telephone Encounter (Signed)
Stephanie Raymond with [facility]; pt has had diarrhea x 4 today. Nausea, lower abdominal pain (pain level now is 4) Stephanie Raymond scheduled appt for pt with Dr Milinda Antis 10/03/11 at 9 am. No dehydration symptoms; pt request call back today. [facility].Please advise. Stephanie Raymond with [facility] is sending note also; Stephanie Raymond wanted to make sure pt would be contacted today.

## 2011-10-02 NOTE — Telephone Encounter (Signed)
I will send px for nausea (phenergan) to her pharmacy- please inform her that this med can make her sleepy- so watch out for that  If symptoms worsen at all -please go to the ER tonight -- I do not want to risk a bout with colitis again Otherwise I will see her tomorrow as planned

## 2011-10-02 NOTE — Telephone Encounter (Signed)
Caller: [name]/Patient; Patient Name: Stephanie Raymond; PCP: Judy Pimple.; Best Callback Phone Number: [phone] She started with diarrhea on 09/28/11 - joint pain for week prior to sx starting. Afebrile. She is Nauseous and having vomiting. 6 x in past 24 hours. Drinking frequent small amounts of water. Last voided 1615-today 10/02/11- light yellow urine. She is having pain in lower abdomen whenever she moves (#4 on 1-10 scale.)  Stool is liquid brownish in color with some pus. 4-5 x today. She took Immodium 2x yesterday and didn't help. Triage and Care advice per Diarrhea Protocol and appnt advised within 24 hours for "diarrhea lasting onger than 24 hours AND not improving with home care". Scheduled for 0900 10/03/11 with Dr.Tower. SHE IS REQUESTING MED FOR NAUSEA BE [facility]ED [facility] [place] [phone]

## 2011-10-03 ENCOUNTER — Ambulatory Visit: Payer: 59 | Admitting: Family Medicine

## 2011-10-03 ENCOUNTER — Encounter: Payer: Self-pay | Admitting: Family Medicine

## 2011-10-03 ENCOUNTER — Ambulatory Visit (INDEPENDENT_AMBULATORY_CARE_PROVIDER_SITE_OTHER): Payer: 59 | Admitting: Family Medicine

## 2011-10-03 VITALS — BP 118/66 | HR 56 | Temp 98.2°F | Ht 64.0 in | Wt 160.5 lb

## 2011-10-03 DIAGNOSIS — R197 Diarrhea, unspecified: Secondary | ICD-10-CM

## 2011-10-03 NOTE — Progress Notes (Signed)
Subjective:    Patient ID: Stephanie Raymond, female    DOB: 01-10-1956, 56 y.o.   MRN: [id number]  HPI Here for acute diarrhea and nausea Given some phenergan last night   Pressure in lower abdomen  Had diarrhea since Thursday - it all started then  Had eaten 2 pc pizza/ [place] food  No one else got sick  No fever  Nausea started fri / sat -- had been vomiting , but not today  Little better with phenergan   Has taken immodium for 4 d- no imp  7-10 bm per day -mostly watery  No blood in it  This does not feel like the colitis she had  No sick contacts  No appetite    Seeing Dr Gavin Potters for HSP--joints have been hurting more -- started prednisone yesterday (was ok since last week)- has done PT  Dr Sueanne Margarita for anemia - with procrit Dr Marva Panda for GI- has had EGD and colonosc , has hepatomegally- former alcohol   ( had an ultrasound) Also pos hep B and C abx in the past  Still undergoing w/u  No drinking at all    Patient Active Problem List  Diagnosis  . HYPOTHYROIDISM  . History of alcohol abuse  . DEPRESSION  . HYPERTENSION  . GERD  . PSORIASIS  . BACK PAIN, LUMBAR  . CARPAL TUNNEL SYNDROME, HX OF  . TRANSIENT ISCHEMIC ATTACK, HX OF  . INTERTRIGO, CANDIDAL  . ENLARGEMENT OF LYMPH NODES  . Hyponatremia  . Thrombocytopenia  . Routine general medical examination at a health care facility  . Gynecological examination  . Other screening mammogram  . Former smoker  . Sore throat  . Renal insufficiency  . Anemia  . Hemorrhoids  . Gastroenteritis  . Weakness  . HSP (Henoch Schonlein purpura)  . Edema  . Wound, open, leg  . Fatigue  . Weakness generalized  . Falls frequently  . Head injury, closed  . Cirrhosis  . Henoch-Schonlein purpura   Past Medical History  Diagnosis Date  . Depression   . GERD (gastroesophageal reflux disease)   . Hypertension   . Hypothyroidism   . Hx of transient ischemic attack (TIA) 2000    Hosp ?  [facility]     . History of CT scan 04/1998    small lacunar infarcts  . History of MRI of brain and brain stem 07/1998    white matter changes  . Psoriasis     on humira  . Vasculitis     h/o Henoch-Scholein Purpura   Past Surgical History  Procedure Date  . Cervical conization w/bx 1980's  . Carotid ultrasound 04/1998    neg  . Foot surgery     toe treated, as a child  . Cervical cone biopsy 02/1999    microinvasive cervical carcinoma  . Partial hysterectomy 02/1999   History  Substance Use Topics  . Smoking status: Current Everyday Smoker    Last Attempt to Quit: 11/03/2010  . Smokeless tobacco: Never Used  . Alcohol Use: No   Family History  Problem Relation Age of Onset  . Emphysema Mother     smoker  . Heart disease Father 3    CABG  . Heart disease Other     MI in 80s  . Heart disease Other     CHF  . Hypertension Other   . Colon cancer Neg Hx   . Colon polyps Neg Hx   . Stomach cancer Neg  Hx   . Rectal cancer Neg Hx    No Known Allergies Current Outpatient Prescriptions on File Prior to Visit  Medication Sig Dispense Refill  . buPROPion (WELLBUTRIN SR) 150 MG 12 hr tablet Take 2 tablets (300 mg total) by mouth daily.  180 tablet  3  . Cholecalciferol (VITAMIN D-3 PO) Take 1 capsule by mouth daily.       [facility] HYDROcodone-acetaminophen (NORCO) 7.5-325 MG per tablet Take 1 tablet by mouth every 6 (six) hours as needed for pain.  30 tablet  2  . levothyroxine (SYNTHROID) 50 MCG tablet Take 1 tablet (50 mcg total) by mouth daily.  90 tablet  3  . losartan (COZAAR) 100 MG tablet Take 100 mg by mouth daily.      . magnesium oxide (MAG-OX) 400 MG tablet Take 400 mg by mouth daily.      [facility] omeprazole (PRILOSEC) 20 MG capsule Take 1 capsule (20 mg total) by mouth daily.  90 capsule  3  . docusate sodium (COLACE) 100 MG capsule Take 1 capsule (100 mg total) by mouth 2 (two) times daily.  180 capsule  3  . furosemide (LASIX) 80 MG tablet Take 1 tablet (80 mg total) by mouth 2 (two)  times daily.  180 tablet  3  . promethazine (PHENERGAN) 25 MG tablet Take 1 tablet (25 mg total) by mouth every 8 (eight) hours as needed for nausea.  12 tablet  0  . triamterene-hydrochlorothiazide (MAXZIDE-25) 37.5-25 MG per tablet Take 1 each (1 tablet total) by mouth daily.  90 tablet  3   Current Facility-Administered Medications on File Prior to Visit  Medication Dose Route Frequency Provider Last Rate Last Dose  . 0.9 %  sodium chloride infusion  500 mL Intravenous Continuous Louis Meckel, MD           Review of Systems Review of Systems  Constitutional: Negative for fever, appetite change,  and pos for wt loss  Eyes: Negative for pain and visual disturbance.  Respiratory: Negative for cough and shortness of breath.   Cardiovascular: Negative for cp or palpitations    Gastrointestinal: Neg for blood in stool, dark stools, hematemasis, jaundice .  Genitourinary: Negative for urgency and frequency.  Skin: Negative for pallor or rash   Neurological: Negative for weakness, light-headedness, numbness and headaches.  Hematological: Negative for adenopathy. Does not bruise/bleed easily.  Psychiatric/Behavioral: Negative for dysphoric mood. The patient is not nervous/anxious.         Objective:   Physical Exam  Constitutional: She appears well-developed and well-nourished. No distress.  HENT:  Head: Normocephalic and atraumatic.  Mouth/Throat: Oropharynx is clear and moist.  Eyes: Conjunctivae and EOM are normal. Pupils are equal, round, and reactive to light. No scleral icterus.  Neck: Normal range of motion. Neck supple. No JVD present. No thyromegaly present.  Cardiovascular: Normal rate, regular rhythm, normal heart sounds and intact distal pulses.   Pulmonary/Chest: Effort normal and breath sounds normal. No respiratory distress. She has no wheezes.       Diffusely distant bs   Abdominal: Soft. Bowel sounds are normal. She exhibits no distension and no mass. There is  tenderness. There is no rebound and no guarding.       Mild tenderness bilat LQ without rebound or gaurding Mildly inc bs-not high pitched or tinkling hepatomegally No jaundice   Musculoskeletal:       Improved pedal edema  Lymphadenopathy:    She has no cervical adenopathy.  Neurological:  She is alert. She has normal reflexes.       No tremor today  Skin: Skin is warm and dry. No rash noted. No erythema. No pallor.       Brisk capillary refil Nl skin turgor    Psychiatric: She has a normal mood and affect.          Assessment & Plan:

## 2011-10-03 NOTE — Patient Instructions (Addendum)
Take the phenergan for nausea  Make sure to keep up your fluid intake  If you can - take your potassium due to the diarrhea  Get labs drawn this am at [facility]- please ask them to send me results asap (they can also call me)  If you are better and get the clearance from Dr Gavin Potters - will write you a note on Friday  Please update me if your symptoms worsen or do not improve

## 2011-10-05 ENCOUNTER — Emergency Department: Payer: Self-pay | Admitting: Emergency Medicine

## 2011-10-05 ENCOUNTER — Telehealth: Payer: Self-pay

## 2011-10-05 DIAGNOSIS — R197 Diarrhea, unspecified: Secondary | ICD-10-CM

## 2011-10-05 LAB — CBC
HCT: 30.8 % — ABNORMAL LOW (ref 35.0–47.0)
MCHC: 34.2 g/dL (ref 32.0–36.0)
MCV: 95 fL (ref 80–100)
RDW: 14.1 % (ref 11.5–14.5)

## 2011-10-05 LAB — URINALYSIS, COMPLETE
Bilirubin,UR: NEGATIVE
Glucose,UR: NEGATIVE mg/dL (ref 0–75)
Hyaline Cast: 3
Ketone: NEGATIVE
Leukocyte Esterase: NEGATIVE
Nitrite: NEGATIVE
Ph: 8 (ref 4.5–8.0)
RBC,UR: 3 /HPF (ref 0–5)
Squamous Epithelial: 2
WBC UR: 1 /HPF (ref 0–5)

## 2011-10-05 LAB — COMPREHENSIVE METABOLIC PANEL
Albumin: 3.6 g/dL (ref 3.4–5.0)
Alkaline Phosphatase: 108 U/L (ref 50–136)
Anion Gap: 15 (ref 7–16)
BUN: 20 mg/dL — ABNORMAL HIGH (ref 7–18)
Bilirubin,Total: 1.6 mg/dL — ABNORMAL HIGH (ref 0.2–1.0)
Chloride: 93 mmol/L — ABNORMAL LOW (ref 98–107)
Creatinine: 1.12 mg/dL (ref 0.60–1.30)
Glucose: 73 mg/dL (ref 65–99)
Potassium: 2.7 mmol/L — ABNORMAL LOW (ref 3.5–5.1)
SGOT(AST): 25 U/L (ref 15–37)
SGPT (ALT): 14 U/L (ref 12–78)

## 2011-10-05 LAB — MAGNESIUM: Magnesium: 1.5 mg/dL — ABNORMAL LOW

## 2011-10-05 NOTE — Telephone Encounter (Signed)
Informed patient spouse as instructed, faxed OV to [facility] ER

## 2011-10-05 NOTE — Telephone Encounter (Signed)
I spoke to [place] (Dr Reyes Ivan nurse)-- as he is out of the office  She inst pt's husband to take her to the ER (will go to ambulance)- since her K is so low and she may be getting dehydrated  She will not be coming in , then to get the stool cups today (but I made the order future so that is ok)  Can you please send last office note to the ER at [facility]? --thanks so much!

## 2011-10-05 NOTE — Telephone Encounter (Signed)
Spoke to patient husband, He stated that she is taking Klor-Con for potassium. Dr. Marva Panda GI # is [phone]. He stated she is vomiting on and off, no fever, and that she needs something called in for relief of the diarrhea. Mailed a copy of labs to patient.

## 2011-10-05 NOTE — Telephone Encounter (Signed)
Please send her lab copy Please ask if she is taking any K  ?  (I wrote inst on her labs yesterday and put them on Rhonda's desk) Her K was low - and I may need to call in px for that  I'm glad she is getting fluids Please send copy of labs and last note also to Dr Marva Panda - her GI doctor and ask her to call his office about her symptoms as well  I want to go ahead and get some stool studies - will put orders in and she can pick up containers I also need Dr Reyes Ivan office phone number -- since I am working at home - can you get that to me so I can call over there Then send this back to me, thanks

## 2011-10-05 NOTE — Telephone Encounter (Signed)
Pt seen 10/03/11; pt had diarrhea since Fri,has not eaten in 4 days. Pt drinking water and gingerale; no vomiting in 2 days and no fever.Lower abdominal pressure still there but not as bad as 10/03/11. Had diarrhea every hour last night; diarrhea x 2 this AM. Pt request lab results faxed from [facility](put on Dr Royden Purl desk 10/04/11 not scanned yet)[facility]Please advise.

## 2011-10-05 NOTE — Telephone Encounter (Signed)
I just spoke to Dr Reyes Ivan nurse - she is going to talk to her husband and call me back- reviewed labs together- concern of dehydration

## 2011-10-08 ENCOUNTER — Other Ambulatory Visit: Payer: Self-pay | Admitting: Emergency Medicine

## 2011-10-08 LAB — POTASSIUM: Potassium: 3.2 mmol/L — ABNORMAL LOW (ref 3.5–5.1)

## 2011-10-08 NOTE — Assessment & Plan Note (Signed)
?   Viral etiol or other  No signs of dehydration today- but worry about this developing in light of renal insuff Rev recent specialist notes - HSP in past, more joint pain lately and on prednisone- otherwise imp Recent EGD and colonosc Past hx of hep B and C, no jaundice today  Hx of anemia and electrolyte abn  Lab today  Of note per pt no alcohol at all- which is excellent  Will update if sympt worsen- will have labs drawn at [facility] as she already has a lab appt there this am

## 2011-10-09 ENCOUNTER — Telehealth: Payer: Self-pay | Admitting: Family Medicine

## 2011-10-09 ENCOUNTER — Other Ambulatory Visit: Payer: Self-pay | Admitting: Family Medicine

## 2011-10-09 ENCOUNTER — Other Ambulatory Visit (INDEPENDENT_AMBULATORY_CARE_PROVIDER_SITE_OTHER): Payer: 59

## 2011-10-09 DIAGNOSIS — R197 Diarrhea, unspecified: Secondary | ICD-10-CM

## 2011-10-09 NOTE — Telephone Encounter (Signed)
I wrote her one for tomorrow- she will decide tomorrow how she feels

## 2011-10-09 NOTE — Telephone Encounter (Signed)
Needs note to go back to work

## 2011-10-11 ENCOUNTER — Telehealth: Payer: Self-pay | Admitting: Radiology

## 2011-10-11 ENCOUNTER — Other Ambulatory Visit: Payer: 59

## 2011-10-11 LAB — CLOSTRIDIUM DIFFICILE EIA: C difficile Toxins A+B, EIA: NEGATIVE

## 2011-10-11 LAB — FECAL OCCULT BLOOD, IMMUNOCHEMICAL
Fecal Occult Bld: POSITIVE
Fecal Occult Bld: POSITIVE

## 2011-10-11 NOTE — Telephone Encounter (Signed)
Gene, pts husband left v/m requesting letter pt may return to work on 10/16/11. Stool sample results requested also.Please advise.

## 2011-10-11 NOTE — Telephone Encounter (Signed)
[facility] called an ifob result- positive x 2 (2 different sample submitted)

## 2011-10-11 NOTE — Telephone Encounter (Signed)
Please let them know that her c diff test is negative , but she did have some microscopic blood in her stool  I am still waiting on the stool culture and will let them know when that comes back Please send the completed labs to her GI doctor  I will print back to work letter and put in IN box

## 2011-10-12 NOTE — Telephone Encounter (Signed)
Gene patient spouse informed, he stated they are leaving [place]. He said to call on his cell phone [phone] with lab results.

## 2011-10-13 LAB — STOOL CULTURE

## 2011-10-13 NOTE — Telephone Encounter (Signed)
Labs are not back yet - will remember to do that

## 2011-10-22 ENCOUNTER — Encounter: Payer: Self-pay | Admitting: Cardiothoracic Surgery

## 2011-10-22 ENCOUNTER — Ambulatory Visit: Payer: Self-pay | Admitting: Internal Medicine

## 2011-10-22 ENCOUNTER — Encounter: Payer: Self-pay | Admitting: Nurse Practitioner

## 2011-10-24 ENCOUNTER — Telehealth: Payer: Self-pay

## 2011-10-24 NOTE — Telephone Encounter (Signed)
The last 3 office visits and last 2 months of labs have been faxed to Dr. Marva Panda.

## 2011-10-24 NOTE — Telephone Encounter (Signed)
Please send last 3 office visits and last 2 months of labs to her GI (Dr Marva Panda) I thought this was already done, but apparently not  I will also forward this to [place] re: phone issue

## 2011-10-24 NOTE — Telephone Encounter (Signed)
This call re: 10/09/11 stool culture result note; Pt is out of work for another month; pt wants to wait until closer to time to return to work before she gets letter about returning to work and pt will call back for that letter. Pt request office notes and lab results re: diarrhea faxed to Dr Ricki Rodriguez at [facility] [phone]. Pt does not need referral and will make appt with Dr Ricki Rodriguez herself. Pt is frustrated with phone system; spoke with office manager 1 month ago and has not gotten call back yet.

## 2011-10-30 ENCOUNTER — Other Ambulatory Visit: Payer: Self-pay | Admitting: Gastroenterology

## 2011-11-01 LAB — STOOL CULTURE

## 2011-11-21 ENCOUNTER — Encounter: Payer: Self-pay | Admitting: Nurse Practitioner

## 2011-11-21 ENCOUNTER — Encounter: Payer: Self-pay | Admitting: Cardiothoracic Surgery

## 2012-01-03 ENCOUNTER — Other Ambulatory Visit: Payer: Self-pay | Admitting: *Deleted

## 2012-01-03 NOTE — Telephone Encounter (Signed)
Received faxed refill request for [facility] [place], we filled med on 07/31/11 for 180 tablet with 3 refills and it was sent to mail order pharmacy so should not need refill yet. Called pt to find out if they are still using mail order pharmacy or do they need [facility] [place], called pt but no answer and no voicemail, will try to call back later

## 2012-01-04 NOTE — Telephone Encounter (Signed)
Called pt again and no answer and no voicemail, mailed pt letter requesting her to call office and let us know what pharm she is using and if she needs a refill on med

## 2012-01-05 ENCOUNTER — Other Ambulatory Visit: Payer: Self-pay | Admitting: *Deleted

## 2012-01-05 NOTE — Telephone Encounter (Signed)
Opened in error

## 2012-04-02 ENCOUNTER — Other Ambulatory Visit: Payer: Self-pay | Admitting: Family Medicine

## 2012-04-02 MED ORDER — BUPROPION HCL ER (SR) 150 MG PO TB12: 300.0000 mg | ORAL_TABLET | Freq: Every day | ORAL | Status: DC

## 2012-04-02 MED ORDER — OMEPRAZOLE 20 MG PO CPDR: 20.0000 mg | DELAYED_RELEASE_CAPSULE | Freq: Every day | ORAL | Status: DC

## 2012-04-02 MED ORDER — LEVOTHYROXINE SODIUM 50 MCG PO TABS: 50.0000 ug | ORAL_TABLET | Freq: Every day | ORAL | Status: DC

## 2012-04-02 NOTE — Telephone Encounter (Signed)
done

## 2012-04-02 NOTE — Telephone Encounter (Signed)
Received fax refill request, ok to refill  

## 2012-04-02 NOTE — Telephone Encounter (Signed)
Please refil all for 6 mo

## 2012-04-08 ENCOUNTER — Telehealth: Payer: Self-pay

## 2012-04-08 NOTE — Telephone Encounter (Signed)
Pt scheduled for Hep A vaccine #2 tomorrow at 10am. Pt aware of appt date and time.

## 2012-04-08 NOTE — Telephone Encounter (Signed)
Message copied by Chrystie Nose on Mon Apr 08, 2012  1:21 PM ------      Message from: Melvia Heaps D      Created: Wed Apr 03, 2012  9:01 AM      Regarding: RE: Hep A Vaccine #2       yes      ----- Message -----         From: Lily Lovings, RN         Sent: 04/02/2012   3:42 PM           To: Louis Meckel, MD      Subject: Hep A Vaccine #2                                         Dr. Arlyce Dice,            Pt was due for her 2nd Hep A vaccine in December. Pt did not get scheduled for this vaccine. Do you want her to come in now and receive the vaccine? Please advise.            Thanks,      Bonita Quin            ----- Message -----         From: Rossie Muskrat, RN,CGRN         Sent: 04/02/2012   2:59 PM           To: Lily Lovings, RN            Patient is overdue for Hep a #2 please see if she needs to finish the series             ------

## 2012-04-09 ENCOUNTER — Ambulatory Visit (INDEPENDENT_AMBULATORY_CARE_PROVIDER_SITE_OTHER): Payer: 59 | Admitting: Gastroenterology

## 2012-04-09 DIAGNOSIS — Z23 Encounter for immunization: Secondary | ICD-10-CM

## 2012-05-13 ENCOUNTER — Other Ambulatory Visit: Payer: Self-pay | Admitting: Family Medicine

## 2012-05-13 MED ORDER — BUPROPION HCL ER (SR) 150 MG PO TB12: 300.0000 mg | ORAL_TABLET | Freq: Every day | ORAL | Status: DC

## 2012-05-13 NOTE — Telephone Encounter (Signed)
Received fax refill request, ok to refill  

## 2012-05-13 NOTE — Telephone Encounter (Signed)
Will refill electronically  

## 2012-07-12 ENCOUNTER — Encounter: Payer: Self-pay | Admitting: Gastroenterology

## 2012-07-18 ENCOUNTER — Other Ambulatory Visit: Payer: Self-pay | Admitting: Family Medicine

## 2012-07-19 NOTE — Telephone Encounter (Signed)
No recent appt and no future appt, call pt to schedule f/u appt before we refill meds and no answer so left voicemail requesting pt to call office

## 2012-07-19 NOTE — Telephone Encounter (Signed)
appt scheduled and meds refilled until then 

## 2012-07-19 NOTE — Telephone Encounter (Signed)
Pt left v/m requesting cb at [phone]. 

## 2012-07-25 ENCOUNTER — Ambulatory Visit: Payer: Self-pay | Admitting: Oncology

## 2012-08-20 ENCOUNTER — Ambulatory Visit: Payer: Self-pay | Admitting: Oncology

## 2012-09-30 ENCOUNTER — Ambulatory Visit (INDEPENDENT_AMBULATORY_CARE_PROVIDER_SITE_OTHER): Payer: 59 | Admitting: Family Medicine

## 2012-09-30 ENCOUNTER — Encounter: Payer: Self-pay | Admitting: Family Medicine

## 2012-09-30 VITALS — BP 134/96 | HR 110 | Temp 98.5°F | Ht 62.5 in | Wt 167.2 lb

## 2012-09-30 DIAGNOSIS — T380X5A Adverse effect of glucocorticoids and synthetic analogues, initial encounter: Secondary | ICD-10-CM

## 2012-09-30 DIAGNOSIS — Z01419 Encounter for gynecological examination (general) (routine) without abnormal findings: Secondary | ICD-10-CM | POA: Insufficient documentation

## 2012-09-30 DIAGNOSIS — I10 Essential (primary) hypertension: Secondary | ICD-10-CM

## 2012-09-30 DIAGNOSIS — Z124 Encounter for screening for malignant neoplasm of cervix: Secondary | ICD-10-CM

## 2012-09-30 DIAGNOSIS — E039 Hypothyroidism, unspecified: Secondary | ICD-10-CM

## 2012-09-30 DIAGNOSIS — Z0000 Encounter for general adult medical examination without abnormal findings: Secondary | ICD-10-CM

## 2012-09-30 MED ORDER — FUROSEMIDE 80 MG PO TABS: 80.0000 mg | ORAL_TABLET | Freq: Every day | ORAL | Status: DC

## 2012-09-30 MED ORDER — OMEPRAZOLE 20 MG PO CPDR: 20.0000 mg | DELAYED_RELEASE_CAPSULE | Freq: Every day | ORAL | Status: DC

## 2012-09-30 MED ORDER — ALBUTEROL SULFATE HFA 108 (90 BASE) MCG/ACT IN AERS: 2.0000 | INHALATION_SPRAY | RESPIRATORY_TRACT | Status: DC | PRN

## 2012-09-30 MED ORDER — BUPROPION HCL ER (SR) 150 MG PO TB12: 300.0000 mg | ORAL_TABLET | Freq: Every day | ORAL | Status: DC

## 2012-09-30 MED ORDER — LEVOTHYROXINE SODIUM 50 MCG PO TABS: 50.0000 ug | ORAL_TABLET | Freq: Every day | ORAL | Status: DC

## 2012-09-30 NOTE — Assessment & Plan Note (Signed)
Reviewed health habits including diet and exercise and skin cancer prevention Also reviewed health mt list, fam hx and immunizations   Rev labs from Dr Guillermina [place] office in detail  Will check cmet/ tsh and lipids today Disc starting an exercise program-starting out slowly

## 2012-09-30 NOTE — Assessment & Plan Note (Signed)
For tsh today Some fatigue more than usual Reassuring exam

## 2012-09-30 NOTE — Progress Notes (Signed)
Subjective:    Patient ID: Stephanie Raymond, female    DOB: February 18, 1956, 57 y.o.   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  Still sees Dr Gavin Potters -has had her on prednisone - very small dose - has not tolerated biologic drugs  Also watching liver tests  Her psoriasis is worse lately --- and now with eating more fruits she thinks it has helped She sees Dr Cheree Ditto (derm)- and has not been able to take anything  Gets worse in the winter    Is overall doing much better  Finally got everything healed with the wound center  Liver test AST of 77       Wt is up 7 lb with bmi of 30-feels pretty good about that   bp is up a bit today  No cp or palpitations or headaches or edema  No side effects to medicines  BP Readings from Last 3 Encounters:  09/30/12 134/96  10/03/11 118/66  08/09/11 130/60     Td 03  On prednisone - tolerates that well  Not getting any exercise  Alcohol-no more than a drink per week - does not miss it  Smokes 2 cig per day - she is working with a Research officer, political party to quit -and she feels ready Flu shot- did not get this past fall  Pneumovax was 2010  Anemia - sees Dr Orlie Dakin -watching low platelets - last check 47 - he is watching this  No new bleeding or bruising   mammo 12/12 nl  Self exam-- no lumps on self exam / but she does have psoriasis on breasts   Pap10/12 hyst partial in the past  Had cervical problems in the past -wants to continue pap   colonosc 7/13  dexa 10/02 nl   HSP Psoriasis Renal insuff Liver/ etoh   Review of Systems Review of Systems  Constitutional: Negative for fever, appetite change, and unexpected weight change. is fatigued- sleeps 12 hours at a time   Eyes: Negative for pain and visual disturbance.  Respiratory: Negative for cough and shortness of breath.   Cardiovascular: Negative for cp or palpitations    Gastrointestinal: Negative for nausea, diarrhea and constipation.  Genitourinary: Negative for urgency and frequency.  Skin: Negative for pallor or rash   Neurological: Negative for weakness, light-headedness, numbness and headaches.  Hematological: Negative for adenopathy. Does not bruise/bleed easily.  Psychiatric/Behavioral: Negative for dysphoric mood. The patient is not nervous/anxious.  (her depression is improved)        Objective:   Physical Exam  Constitutional: She is oriented to person, place, and time. She appears well-developed and well-nourished. No distress.  Well appearing - much improved   HENT:  Head: Normocephalic and atraumatic.  Right Ear: External ear normal.  Left Ear: External ear normal.  Nose: Nose normal.  Mouth/Throat: Oropharynx is clear and moist.  Nasal mucosa is dry  Eyes: Conjunctivae and EOM are normal. Pupils are equal, round, and reactive to light. Right eye exhibits no discharge. Left eye exhibits no discharge. No scleral icterus.  Neck: Normal range of motion. Neck supple. No JVD present. Carotid bruit is not present. No thyromegaly present.  Cardiovascular: Normal rate, regular rhythm,  normal heart sounds and intact distal pulses.  Exam reveals no gallop.   Pulmonary/Chest: Effort normal and breath sounds normal. No respiratory distress. She has no wheezes. She has no rales.  Diffusely distant bs   Abdominal: Soft. Bowel sounds are normal. She exhibits no distension, no abdominal bruit and no mass. There is no tenderness.  Genitourinary: Rectum normal and vagina normal. No breast swelling, tenderness, discharge or bleeding. There is no rash, tenderness or lesion on the right labia. There is no rash, tenderness or lesion on the left labia. No bleeding around the vagina. No vaginal discharge  found.  Cervix/uterus surgically absent Adnexa unpalpable  Breast exam: No mass, nodules, thickening, tenderness, bulging, retraction, inflamation, nipple discharge or skin changes noted.  No axillary or clavicular LA.  Chaperoned exam.    Musculoskeletal: She exhibits no edema and no tenderness.  Lymphadenopathy:    She has no cervical adenopathy.  Neurological: She is alert and oriented to person, place, and time. She has normal reflexes. No cranial nerve deficit. She exhibits normal muscle tone. Coordination normal.  Skin: Skin is warm and dry. Rash noted. There is erythema.  Diffuse psoriasis   Psychiatric: She has a normal mood and affect.          Assessment & Plan:

## 2012-09-30 NOTE — Assessment & Plan Note (Signed)
Pt is on prednisone for HSP- unable to tolerate other biologic agents Will plan dexa to monitor bone density No recent falls or fractures Rev ca and D recommendations

## 2012-09-30 NOTE — Assessment & Plan Note (Signed)
bp is better on 2nd check  No cp or palpitations or headaches or edema  No side effects to medicines

## 2012-09-30 NOTE — Patient Instructions (Addendum)
Pap smear today  You should get a flu shot in the fall  Ask Dr Gavin Potters about a tetanus shot or shingles vaccine  We will schedule your bone density test at check out

## 2012-10-01 LAB — COMPREHENSIVE METABOLIC PANEL
ALT: 20 IU/L (ref 0–32)
AST: 55 IU/L — ABNORMAL HIGH (ref 0–40)
Albumin/Globulin Ratio: 1.5 (ref 1.1–2.5)
CO2: 25 mmol/L (ref 18–29)
Calcium: 9.8 mg/dL (ref 8.7–10.2)
Chloride: 95 mmol/L — ABNORMAL LOW (ref 97–108)
Potassium: 3.2 mmol/L — ABNORMAL LOW (ref 3.5–5.2)
Sodium: 142 mmol/L (ref 134–144)

## 2012-10-01 LAB — LIPID PANEL
Chol/HDL Ratio: 2.6 ratio units (ref 0.0–4.4)
Cholesterol, Total: 184 mg/dL (ref 100–199)
Triglycerides: 153 mg/dL — ABNORMAL HIGH (ref 0–149)

## 2012-10-01 LAB — TSH: TSH: 4.2 u[IU]/mL (ref 0.450–4.500)

## 2012-10-03 ENCOUNTER — Other Ambulatory Visit: Payer: Self-pay | Admitting: *Deleted

## 2012-10-03 LAB — PAP LB (LIQUID-BASED): PAP Smear Comment: 0

## 2012-10-03 MED ORDER — POTASSIUM CHLORIDE ER 10 MEQ PO TBCR: 10.0000 meq | EXTENDED_RELEASE_TABLET | Freq: Every day | ORAL | Status: DC

## 2012-10-04 ENCOUNTER — Telehealth: Payer: Self-pay | Admitting: Family Medicine

## 2012-10-04 ENCOUNTER — Encounter: Payer: Self-pay | Admitting: *Deleted

## 2012-10-04 DIAGNOSIS — E876 Hypokalemia: Secondary | ICD-10-CM | POA: Insufficient documentation

## 2012-10-04 NOTE — Telephone Encounter (Signed)
I ordered her K test for [facility]-paperwork is in IN box to mail her  thanks

## 2012-10-07 NOTE — Telephone Encounter (Signed)
Orders mailed.

## 2012-10-08 ENCOUNTER — Ambulatory Visit: Payer: Self-pay | Admitting: Family Medicine

## 2012-10-08 LAB — HM DEXA SCAN: HM Dexa Scan: NORMAL

## 2012-10-09 ENCOUNTER — Encounter: Payer: Self-pay | Admitting: Family Medicine

## 2012-10-10 ENCOUNTER — Ambulatory Visit: Payer: Self-pay | Admitting: Family Medicine

## 2012-10-11 ENCOUNTER — Encounter: Payer: Self-pay | Admitting: Family Medicine

## 2012-10-11 ENCOUNTER — Telehealth: Payer: Self-pay | Admitting: Family Medicine

## 2012-10-11 DIAGNOSIS — R921 Mammographic calcification found on diagnostic imaging of breast: Secondary | ICD-10-CM | POA: Insufficient documentation

## 2012-10-11 NOTE — Telephone Encounter (Signed)
Ref for breast biopsy

## 2012-10-14 ENCOUNTER — Ambulatory Visit: Payer: Self-pay | Admitting: Family Medicine

## 2012-10-14 ENCOUNTER — Encounter: Payer: Self-pay | Admitting: Family Medicine

## 2012-10-14 HISTORY — PX: BREAST BIOPSY: SHX20

## 2012-10-15 ENCOUNTER — Encounter: Payer: Self-pay | Admitting: Family Medicine

## 2012-10-16 ENCOUNTER — Encounter: Payer: Self-pay | Admitting: Family Medicine

## 2012-10-17 ENCOUNTER — Encounter: Payer: Self-pay | Admitting: Family Medicine

## 2012-10-18 ENCOUNTER — Encounter: Payer: Self-pay | Admitting: Family Medicine

## 2012-10-18 ENCOUNTER — Encounter: Payer: Self-pay | Admitting: *Deleted

## 2012-12-12 ENCOUNTER — Telehealth: Payer: Self-pay | Admitting: Family Medicine

## 2012-12-12 ENCOUNTER — Emergency Department: Payer: Self-pay | Admitting: Emergency Medicine

## 2012-12-12 LAB — CBC
MCH: 35 pg — ABNORMAL HIGH (ref 26.0–34.0)
MCHC: 34.6 g/dL (ref 32.0–36.0)
MCV: 101 fL — ABNORMAL HIGH (ref 80–100)
Platelet: 66 10*3/uL — ABNORMAL LOW (ref 150–440)
RBC: 3.68 10*6/uL — ABNORMAL LOW (ref 3.80–5.20)
RDW: 12.6 % (ref 11.5–14.5)
WBC: 6.7 10*3/uL (ref 3.6–11.0)

## 2012-12-12 LAB — COMPREHENSIVE METABOLIC PANEL
Albumin: 3.7 g/dL (ref 3.4–5.0)
Bilirubin,Total: 1.4 mg/dL — ABNORMAL HIGH (ref 0.2–1.0)
Creatinine: 1.05 mg/dL (ref 0.60–1.30)
EGFR (Non-African Amer.): 59 — ABNORMAL LOW
Glucose: 95 mg/dL (ref 65–99)
Osmolality: 274 (ref 275–301)
Potassium: 3.1 mmol/L — ABNORMAL LOW (ref 3.5–5.1)
Sodium: 136 mmol/L (ref 136–145)

## 2012-12-12 LAB — PROTIME-INR: Prothrombin Time: 15.4 secs — ABNORMAL HIGH (ref 11.5–14.7)

## 2012-12-12 LAB — APTT: Activated PTT: 38.7 secs — ABNORMAL HIGH (ref 23.6–35.9)

## 2012-12-12 NOTE — Telephone Encounter (Signed)
Please let her know that I am not in the office today-- I do not know what other physician's avail is

## 2012-12-12 NOTE — Telephone Encounter (Signed)
Left voicemail requesting pt to call office 

## 2012-12-12 NOTE — Telephone Encounter (Signed)
Patient Information:  Caller Name: Nolte  Phone: [phone]  Patient: Stephanie Raymond, Stephanie Raymond  Gender: Female  DOB: 19-Feb-1956  Age: 57 Years  PCP: Roxy Manns [facility])  Office Follow Up:  Does the office need to follow up with this patient?: Yes  Instructions For The Office: Upset about being referred to UC.  Would prefer to see MD. Please verify if OK to be seen in office and call ASAP  RN Note:  Larey Seat 12/05/12 injuring right ankle. Bruising present on medial aspect of right knee. Pain rated 6/10 with occasional shooting pain.  Has to participate in mandatory class from 1330 -1730. Advised to go to [facility] but due to distance, plans to go to local urgent care.  Also express dissatisfaction that she could not be promptly seen by her own long term MD.  Rn advised a message could be sent to MD to request approval to be seen in office but, with her time constraints, it still might not be possible and would further delay her care.  Symptoms  Reason For Call & Symptoms: Right ankle and calf pain with edema.  Rheumatologist suggested possible DVT.  Reviewed Health History In EMR: Yes  Reviewed Medications In EMR: Yes  Reviewed Allergies In EMR: Yes  Reviewed Surgeries / Procedures: Yes  Date of Onset of Symptoms: 12/10/2012  Guideline(s) Used:  Leg Injury  Leg Swelling and Edema  Disposition Per Guideline:   Go to ED Now (or to Office with PCP Approval)  Reason For Disposition Reached:   Thigh or calf pain and only 1 side and present > 1 hour  Advice Given:  N/A  Patient Refused Recommendation:  Patient Will [facility] To [facility]

## 2012-12-13 ENCOUNTER — Telehealth: Payer: Self-pay | Admitting: *Deleted

## 2012-12-13 NOTE — Telephone Encounter (Signed)
I do not see the packets in the 10 meq strenth in epic- can you find out thru pharmacy if they exist? -thanks

## 2012-12-13 NOTE — Telephone Encounter (Signed)
Pt wanted to know if you could change her K tab to the orange powder packets because she is finding it hard to swallow the tab, [facility] [place]

## 2012-12-13 NOTE — Telephone Encounter (Addendum)
Pt did go to [facility] and there her BP was 180/113 and her heart rate was elevated also she had a hematoma on her calf, I asked pt if she wanted to come in for a f/u next week to address her BP and heart rate and pt declined she said she will monitor her BP at home and if she thinks she needs a f/u she will call back

## 2012-12-13 NOTE — Telephone Encounter (Signed)
Ok-please send for that ER note-thanks

## 2012-12-13 NOTE — Telephone Encounter (Signed)
Called pharmacy and they said they don't offer the packets in 10 meq strength, I left voicemail letting pt know packets isn't an option and to call us with any questions

## 2012-12-18 ENCOUNTER — Other Ambulatory Visit: Payer: Self-pay | Admitting: Family Medicine

## 2012-12-18 MED ORDER — POTASSIUM CHLORIDE 20 MEQ PO PACK: PACK | ORAL | Status: DC

## 2012-12-18 NOTE — Telephone Encounter (Signed)
See prev note, I have already checked with pharmacy and they don't offer packet on 10 meq of K only 20 meq, I left pt voicemail days ago letting her know this, please advise

## 2012-12-18 NOTE — Telephone Encounter (Signed)
Spoke with pt and she does want to try taking 1/2 packet of the 20 meq, [facility]

## 2012-12-18 NOTE — Telephone Encounter (Signed)
Pt states she cannot take the Potassium pills she was prescribed.  She throws it up 80% of the time because they are so big.  She said last year a doctor at [facility] gave her an orange flavored packet of Potassium to take daily.  She would like to get this instead of the pills.   Can you check to see if the [facility] can get this for the patient

## 2012-12-18 NOTE — Telephone Encounter (Signed)
I wonder if we could px the 20 meq and then she could poor approx 1/2 of the powder into water and take as px a half packet at a time - would she feel comfortable trying this?

## 2012-12-19 NOTE — Telephone Encounter (Signed)
I would rather she follow up - but understand and will rev records when I return  If bp does not improve at home-she really needs to come in

## 2012-12-19 NOTE — Telephone Encounter (Signed)
ER notes placed in your inbox

## 2012-12-27 ENCOUNTER — Ambulatory Visit: Payer: 59 | Admitting: Family Medicine

## 2013-03-13 ENCOUNTER — Ambulatory Visit (INDEPENDENT_AMBULATORY_CARE_PROVIDER_SITE_OTHER): Payer: 59 | Admitting: Internal Medicine

## 2013-03-13 ENCOUNTER — Encounter: Payer: Self-pay | Admitting: Internal Medicine

## 2013-03-13 VITALS — BP 128/84 | HR 102 | Temp 98.4°F | Wt 173.2 lb

## 2013-03-13 DIAGNOSIS — H6120 Impacted cerumen, unspecified ear: Secondary | ICD-10-CM

## 2013-03-13 DIAGNOSIS — J029 Acute pharyngitis, unspecified: Secondary | ICD-10-CM

## 2013-03-13 NOTE — Progress Notes (Signed)
Pre-visit discussion using our clinic review tool. No additional management support is needed unless otherwise documented below in the visit note.  

## 2013-03-13 NOTE — Progress Notes (Signed)
HPI  Pt presents to the clinic today with c/o sore throat. This started about 3 days ago. She does report pain with swallowing and ear pressure on the right. She denies fever, chills or body aches. She has tried some Naproxen OTC which did seem to help. She has not had sick contacts. She is a current smoker.  Review of Systems      Past Medical History  Diagnosis Date  . Depression   . GERD (gastroesophageal reflux disease)   . Hypertension   . Hypothyroidism   . Hx of transient ischemic attack (TIA) 2000    Hosp ?  [facility], female   . History of CT scan 04/1998    small lacunar infarcts  . History of MRI of brain and brain stem 07/1998    white matter changes  . Psoriasis     on humira  . Vasculitis     h/o Henoch-Scholein Purpura    Family History  Problem Relation Age of Onset  . Emphysema Mother     smoker  . Heart disease Father 40    CABG  . Heart disease Other     MI in 65s  . Heart disease Other     CHF  . Hypertension Other   . Colon cancer Neg Hx   . Colon polyps Neg Hx   . Stomach cancer Neg Hx   . Rectal cancer Neg Hx     History   Social History  . Marital Status: Widowed    Spouse Name: N/A    Number of Children: 0  . Years of Education: N/A   Occupational History  . [employer]   Social History Main Topics  . Smoking status: Smoker, Current Status Unknown -- 0.25 packs/day    Last Attempt to Quit: 11/03/2010  . Smokeless tobacco: Never Used  . Alcohol Use: No  . Drug Use: No  . Sexual Activity: Not on file   Other Topics Concern  . Not on file   Social History Narrative  . No narrative on file    No Known Allergies   Constitutional: Denies headache, fatigue, fever or abrupt weight changes.  HEENT:  Positive sore throat. Denies eye redness, eye pain, pressure behind the eyes, facial pain, nasal congestion, ear pain, ringing in the ears, wax buildup, runny nose or bloody nose. Respiratory:  Denies cough,  difficulty breathing or shortness of breath.  Cardiovascular: Denies chest pain, chest tightness, palpitations or swelling in the hands or feet.   No other specific complaints in a complete review of systems (except as listed in HPI above).  Objective:   BP 128/84  Pulse 102  Temp(Src) 98.4 F (36.9 C) (Oral)  Wt 173 lb 4 oz (78.586 kg)  SpO2 97% Wt Readings from Last 3 Encounters:  03/13/13 173 lb 4 oz (78.586 kg)  09/30/12 167 lb 4 oz (75.864 kg)  10/03/11 160 lb 8 oz (72.802 kg)     General: Appears hier stated age, well developed, well nourished in NAD. HEENT: Head: normal shape and size; Eyes: sclera white, no icterus, conjunctiva pink, PERRLA and EOMs intact; Left Ear: Tm's gray and intact, normal light reflex, Right Ear: cerumen impaction; Nose: mucosa pink and moist, septum midline; Throat/Mouth: Teeth present, mucosa erythematous and moist, no exudate noted, no lesions or ulcerations noted.  Neck: Neck supple, trachea midline. No massses, lumps or thyromegaly present.  Cardiovascular: Normal rate and rhythm. S1,S2 noted.  No murmur, rubs  or gallops noted. No JVD or BLE edema. No carotid bruits noted. Pulmonary/Chest: Normal effort and positive vesicular breath sounds. No respiratory distress. No wheezes, rales or ronchi noted.      Assessment & Plan:   Viral pharyngitis with right cerumen impaction:  She refuses a strep test today Get some rest and drink plenty of water Do salt water gargles for the sore throat Continue naproxen OTC Right ear flushed by CMA Get OTC Debrox solution and use to prevent wax buildup  RTC as needed or if symptoms persist.

## 2013-03-13 NOTE — Patient Instructions (Signed)
Viral Pharyngitis Viral pharyngitis is a viral infection that produces redness, pain, and swelling (inflammation) of the throat. It can spread from person to person (contagious). CAUSES Viral pharyngitis is caused by inhaling a large amount of certain germs called viruses. Many different viruses cause viral pharyngitis. SYMPTOMS Symptoms of viral pharyngitis include:  Sore throat.  Tiredness.  Stuffy nose.  Low-grade fever.  Congestion.  Cough. TREATMENT Treatment includes rest, drinking plenty of fluids, and the use of over-the-counter medication (approved by your caregiver). HOME CARE INSTRUCTIONS   Drink enough fluids to keep your urine clear or pale yellow.  Eat soft, cold foods such as ice cream, frozen ice pops, or gelatin dessert.  Gargle with warm salt water (1 tsp salt per 1 qt of water).  If over age 7, throat lozenges may be used safely.  Only take over-the-counter or prescription medicines for pain, discomfort, or fever as directed by your caregiver. Do not take aspirin. To help prevent spreading viral pharyngitis to others, avoid:  Mouth-to-mouth contact with others.  Sharing utensils for eating and drinking.  Coughing around others. SEEK MEDICAL CARE IF:   You are better in a few days, then become worse.  You have a fever or pain not helped by pain medicines.  There are any other changes that concern you. Document Released: 11/16/2004 Document Revised: 05/01/2011 Document Reviewed: 04/14/2010 ExitCare Patient Information 2014 [employer].  

## 2013-03-14 ENCOUNTER — Telehealth: Payer: Self-pay | Admitting: Family Medicine

## 2013-03-14 NOTE — Telephone Encounter (Signed)
Relevant patient education assigned to patient using Emmi. ° °

## 2013-06-16 ENCOUNTER — Ambulatory Visit (INDEPENDENT_AMBULATORY_CARE_PROVIDER_SITE_OTHER): Payer: 59 | Admitting: Family Medicine

## 2013-06-16 ENCOUNTER — Encounter: Payer: Self-pay | Admitting: Family Medicine

## 2013-06-16 VITALS — BP 140/72 | HR 111 | Temp 98.9°F | Ht 62.5 in | Wt 170.5 lb

## 2013-06-16 DIAGNOSIS — M62838 Other muscle spasm: Secondary | ICD-10-CM

## 2013-06-16 DIAGNOSIS — S300XXA Contusion of lower back and pelvis, initial encounter: Secondary | ICD-10-CM

## 2013-06-16 DIAGNOSIS — Z87891 Personal history of nicotine dependence: Secondary | ICD-10-CM

## 2013-06-16 DIAGNOSIS — S20229A Contusion of unspecified back wall of thorax, initial encounter: Secondary | ICD-10-CM

## 2013-06-16 DIAGNOSIS — E039 Hypothyroidism, unspecified: Secondary | ICD-10-CM

## 2013-06-16 DIAGNOSIS — R5383 Other fatigue: Secondary | ICD-10-CM

## 2013-06-16 DIAGNOSIS — R5381 Other malaise: Secondary | ICD-10-CM

## 2013-06-16 DIAGNOSIS — R062 Wheezing: Secondary | ICD-10-CM

## 2013-06-16 MED ORDER — ALBUTEROL SULFATE HFA 108 (90 BASE) MCG/ACT IN AERS: 2.0000 | INHALATION_SPRAY | RESPIRATORY_TRACT | Status: DC | PRN

## 2013-06-16 NOTE — Assessment & Plan Note (Signed)
Likely triggers incl allergies/pollen and also smoking  Refilled albuterol mdi If no imp with this or not resolved may need oral prednisone or inhaled steroid Counseled re: smoking cessation

## 2013-06-16 NOTE — Assessment & Plan Note (Signed)
After a fall from bed  Disc fall prev and safety Recommend intermittent heat and ice  No bony tenderness  Suspect this will resolve very slowly-disc expectations but inst to update/ follow up if symptoms worsen or do not improve

## 2013-06-16 NOTE — Progress Notes (Signed)
Pre visit review using our clinic review tool, if applicable. No additional management support is needed unless otherwise documented below in the visit note. 

## 2013-06-16 NOTE — Assessment & Plan Note (Signed)
Light -1-2 cig per day Disc in detail risks of smoking and possible outcomes including copd, vascular/ heart disease, cancer , respiratory and sinus infections  Pt voices understanding She is not ready to quit

## 2013-06-16 NOTE — Assessment & Plan Note (Signed)
With increased fatigue Tsh today

## 2013-06-16 NOTE — Progress Notes (Signed)
Subjective:    Patient ID: Stephanie Raymond, female    DOB: 1955-05-09, 58 y.o.   MRN: [id number]  HPI Here with neck and back pain   Several weeks ago - she tripped on steps getting out of bed  She hit her back - on the steps  Mid to lower- very swollen but not bruised  Was painful - some improvement - dull in nature without radiation   Her neck hurt the following day  She still has a little pain but some tingling down the R shoulder and chest area   No energy at all  Is also stiff after inactivity   Has been looking for a job for over a year  HSP has been ok - but very tired  Husband has to help her with things  No more swelling or blistering of feet   Due for f/u with Dr Jefm Bryant   Really wants to look into getting disability   Also having some more wheezing and ran out of albuterol (MDI) Pollen is making it worse She is back to smoking 1-2 cig per day unfortunately No cough Not ready to quit   Patient Active Problem List   Diagnosis Date Noted  . Calcification of left breast 10/11/2012  . Encounter for routine gynecological examination 09/30/2012  . Cirrhosis 07/31/2011  . 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 Hemorrhoids 02/28/2011  . Former smoker 12/01/2010  . Routine general medical examination at a health care facility 11/30/2010  . Other screening mammogram 11/30/2010  . BACK PAIN, LUMBAR 02/04/2007  . HYPOTHYROIDISM 01/29/2007  . History of alcohol abuse 01/29/2007  . DEPRESSION 01/29/2007  . HYPERTENSION 01/29/2007  . GERD 01/29/2007  . PSORIASIS 01/29/2007  . CARPAL TUNNEL SYNDROME, HX OF 01/29/2007  . TRANSIENT ISCHEMIC ATTACK, HX OF 01/29/2007   Past Medical History  Diagnosis Date  . Depression   . GERD (gastroesophageal reflux disease)   . Hypertension   . Hypothyroidism   . Hx of transient ischemic attack (TIA) 2000    Hosp ?  [facility]   . History of CT scan 04/1998    small lacunar infarcts  . History of MRI of brain  and brain stem 07/1998    white matter changes  . Psoriasis     on humira  . Vasculitis     h/o Henoch-Scholein Purpura   Past Surgical History  Procedure Laterality Date  . Cervical conization w/bx  1980's  . Carotid ultrasound  04/1998    neg  . Foot surgery      toe treated, as a child  . Cervical cone biopsy  02/1999    microinvasive cervical carcinoma  . Partial hysterectomy  02/1999   History  Substance Use Topics  . Smoking status: Current Every Day Smoker -- 0.10 packs/day    Types: Cigarettes    Last Attempt to Quit: 11/03/2010  . Smokeless tobacco: Never Used     Comment: 1-2 cigaretts a day  . Alcohol Use: No   Family History  Problem Relation Age of Onset  . Emphysema Mother     smoker  . Heart disease Father 83    CABG  . Heart disease Other     MI in 54s  . Heart disease Other     CHF  . Hypertension Other   . Colon cancer Neg Hx   . Colon polyps Neg Hx   . Stomach cancer Neg Hx   . Rectal cancer Neg Hx  No Known Allergies Current Outpatient Prescriptions on File Prior to Visit  Medication Sig Dispense Refill  . Cholecalciferol (VITAMIN D-3 PO) Take 1 capsule by mouth daily.       . furosemide (LASIX) 80 MG tablet Take 1 tablet (80 mg total) by mouth daily.  90 tablet  3  . levothyroxine (SYNTHROID, LEVOTHROID) 50 MCG tablet Take 1 tablet (50 mcg total) by mouth daily before breakfast.  90 tablet  3  . omeprazole (PRILOSEC) 20 MG capsule Take 1 capsule (20 mg total) by mouth daily.  90 capsule  3  . albuterol (PROVENTIL HFA;VENTOLIN HFA) 108 (90 BASE) MCG/ACT inhaler Inhale 2 puffs into the lungs every 4 (four) hours as needed for wheezing.  3 Inhaler  3  . potassium chloride (KLOR-CON) 20 MEQ packet Take 1/2 packet prepared as directed once daily  15 packet  5   No current facility-administered medications on file prior to visit.      Review of Systems Review of Systems  Constitutional: Negative for fever, appetite change, fatigue and  unexpected weight change.  Eyes: Negative for pain and visual disturbance.  Respiratory: Negative for cough and shortness of breath.  pos for wheezing  Cardiovascular: Negative for cp or palpitations    Gastrointestinal: Negative for nausea, diarrhea and constipation.  Genitourinary: Negative for urgency and frequency.  Skin: Negative for pallor or rash  pos for large bruise on back , pos for mild neck soreness  Neurological: Negative for weakness, light-headedness, numbness and headaches.  Hematological: Negative for adenopathy. Does not bruise/bleed easily.  Psychiatric/Behavioral: Negative for dysphoric mood. The patient is not nervous/anxious.         Objective:   Physical Exam  Constitutional: She appears well-developed and well-nourished. No distress.  overwt and well appearing   HENT:  Head: Normocephalic and atraumatic.  Mouth/Throat: Oropharynx is clear and moist.  Eyes: Conjunctivae and EOM are normal. Pupils are equal, round, and reactive to light. No scleral icterus.  Neck: Normal range of motion. Neck supple. No JVD present.  No CS tenderness Muscular para cervical spasm and tenderness on the R No trap tenderness Nl rom neck and shoulders  Discomfort on full flexion and rot L  No crepitus  Cardiovascular: Regular rhythm and intact distal pulses.  Exam reveals no gallop.   Pulmonary/Chest: Effort normal. No respiratory distress. She has wheezes. She has no rales. She exhibits no tenderness.  Diffusely distant bs Mild exp wheezes throughout  No rales or rhonchi  Musculoskeletal: She exhibits edema and tenderness.  Large contusion with swelling and ecchymosis over high LS bilaterally  Tender over soft tissue but not spine Nl rom    Lymphadenopathy:    She has no cervical adenopathy.  Neurological: She is alert. She has normal reflexes. She displays no atrophy. No cranial nerve deficit or sensory deficit. She exhibits normal muscle tone. Coordination normal.    Baseline tremor  Skin: Skin is warm and dry. No rash noted. No pallor.  Psychiatric:  Pt seems fatigued Mildly anxious Pleasant           Assessment & Plan:

## 2013-06-16 NOTE — Assessment & Plan Note (Signed)
Since dx with HSP - is ongoing  Lab today

## 2013-06-16 NOTE — Patient Instructions (Signed)
Labs today  Use heat and ice intermittently on back bruise  Use heat on neck  If symptoms worsen let me know  Use albuterol inhaler for wheezing every 4 hours as needed - if not improved please let me know  Keep thinking about quitting smoking

## 2013-06-16 NOTE — Assessment & Plan Note (Signed)
R sided  Poss mild radiculopathy that is improved No spinal tenderness Disc use of heat and stretches Update if not starting to improve in a week or if worsening   Continue to follow

## 2013-06-17 LAB — CBC WITH DIFFERENTIAL/PLATELET
BASOS: 1 %
Basophils Absolute: 0 10*3/uL (ref 0.0–0.2)
EOS ABS: 0.1 10*3/uL (ref 0.0–0.4)
EOS: 1 %
HCT: 36.4 % (ref 34.0–46.6)
HEMOGLOBIN: 13.2 g/dL (ref 11.1–15.9)
IMMATURE GRANS (ABS): 0 10*3/uL (ref 0.0–0.1)
Immature Granulocytes: 0 %
LYMPHS: 20 %
Lymphocytes Absolute: 1.1 10*3/uL (ref 0.7–3.1)
MCH: 34.8 pg — AB (ref 26.6–33.0)
MCHC: 36.3 g/dL — ABNORMAL HIGH (ref 31.5–35.7)
MCV: 96 fL (ref 79–97)
MONOCYTES: 8 %
Monocytes Absolute: 0.5 10*3/uL (ref 0.1–0.9)
NEUTROS ABS: 4 10*3/uL (ref 1.4–7.0)
NEUTROS PCT: 70 %
RBC: 3.79 x10E6/uL (ref 3.77–5.28)
RDW: 13.3 % (ref 12.3–15.4)
WBC: 5.7 10*3/uL (ref 3.4–10.8)

## 2013-06-17 LAB — COMPREHENSIVE METABOLIC PANEL
A/G RATIO: 1.5 (ref 1.1–2.5)
ALBUMIN: 4.3 g/dL (ref 3.5–5.5)
ALT: 27 IU/L (ref 0–32)
AST: 91 IU/L — ABNORMAL HIGH (ref 0–40)
Alkaline Phosphatase: 94 IU/L (ref 39–117)
BILIRUBIN TOTAL: 1.9 mg/dL — AB (ref 0.0–1.2)
BUN/Creatinine Ratio: 15 (ref 9–23)
BUN: 11 mg/dL (ref 6–24)
CALCIUM: 8.4 mg/dL — AB (ref 8.7–10.2)
CO2: 33 mmol/L — ABNORMAL HIGH (ref 18–29)
CREATININE: 0.71 mg/dL (ref 0.57–1.00)
Chloride: 80 mmol/L — ABNORMAL LOW (ref 97–108)
GFR, EST AFRICAN AMERICAN: 109 mL/min/{1.73_m2} (ref >59)
GFR, EST NON AFRICAN AMERICAN: 95 mL/min/{1.73_m2} (ref >59)
GLOBULIN, TOTAL: 2.9 g/dL (ref 1.5–4.5)
GLUCOSE: 93 mg/dL (ref 65–99)
Potassium: 2.9 mmol/L — ABNORMAL LOW (ref 3.5–5.2)
Sodium: 135 mmol/L (ref 134–144)
TOTAL PROTEIN: 7.2 g/dL (ref 6.0–8.5)

## 2013-06-17 LAB — TSH: TSH: 3.02 u[IU]/mL (ref 0.450–4.500)

## 2013-06-24 ENCOUNTER — Other Ambulatory Visit: Payer: 59

## 2013-06-26 LAB — SPECIMEN STATUS REPORT

## 2013-06-26 LAB — PLATELET COUNT: Platelets: 101 10*3/uL — ABNORMAL LOW (ref 150–379)

## 2013-07-01 ENCOUNTER — Encounter: Payer: Self-pay | Admitting: *Deleted

## 2013-07-02 ENCOUNTER — Other Ambulatory Visit: Payer: Self-pay | Admitting: Family Medicine

## 2013-07-02 DIAGNOSIS — E876 Hypokalemia: Secondary | ICD-10-CM

## 2013-07-09 ENCOUNTER — Other Ambulatory Visit (INDEPENDENT_AMBULATORY_CARE_PROVIDER_SITE_OTHER): Payer: 59

## 2013-07-09 DIAGNOSIS — E876 Hypokalemia: Secondary | ICD-10-CM

## 2013-07-10 LAB — POTASSIUM: Potassium: 3.3 mmol/L — ABNORMAL LOW (ref 3.5–5.2)

## 2013-07-11 ENCOUNTER — Encounter: Payer: Self-pay | Admitting: *Deleted

## 2013-07-21 ENCOUNTER — Other Ambulatory Visit: Payer: Self-pay | Admitting: Family Medicine

## 2013-07-22 MED ORDER — POTASSIUM CHLORIDE 20 MEQ PO PACK: PACK | ORAL | Status: DC

## 2013-07-22 NOTE — Telephone Encounter (Signed)
Electronic refill request, please advise  

## 2013-07-22 NOTE — Telephone Encounter (Signed)
done

## 2013-07-22 NOTE — Telephone Encounter (Signed)
Please refill for 6 mo 

## 2013-07-31 ENCOUNTER — Telehealth: Payer: Self-pay | Admitting: *Deleted

## 2013-07-31 DIAGNOSIS — G119 Hereditary ataxia, unspecified: Secondary | ICD-10-CM

## 2013-07-31 NOTE — Telephone Encounter (Signed)
Dr. Glori Bickers received pt's OV from Rheumatology and it suggest that we refer pt to Neurology.  Called pt and she does want [place] to put referral in for Neurology, please put referral in, I advise pt that Marion/Linda will call to schedule an appt. (pt said if possible by the end of the month).  OV notes from Rheumatology placed back in your inbox

## 2013-08-01 DIAGNOSIS — G119 Hereditary ataxia, unspecified: Secondary | ICD-10-CM | POA: Insufficient documentation

## 2013-08-01 NOTE — Telephone Encounter (Signed)
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

## 2013-08-06 ENCOUNTER — Other Ambulatory Visit: Payer: Self-pay | Admitting: Family Medicine

## 2013-08-06 DIAGNOSIS — D696 Thrombocytopenia, unspecified: Secondary | ICD-10-CM

## 2013-08-06 DIAGNOSIS — E876 Hypokalemia: Secondary | ICD-10-CM

## 2013-08-19 ENCOUNTER — Other Ambulatory Visit (INDEPENDENT_AMBULATORY_CARE_PROVIDER_SITE_OTHER): Payer: 59

## 2013-08-19 DIAGNOSIS — D696 Thrombocytopenia, unspecified: Secondary | ICD-10-CM

## 2013-08-19 DIAGNOSIS — E876 Hypokalemia: Secondary | ICD-10-CM

## 2013-08-20 LAB — CBC WITH DIFFERENTIAL/PLATELET
BASOS ABS: 0 10*3/uL (ref 0.0–0.2)
Basos: 1 %
EOS ABS: 0 10*3/uL (ref 0.0–0.4)
Eos: 1 %
HEMATOCRIT: 33.7 % — AB (ref 34.0–46.6)
Hemoglobin: 11.7 g/dL (ref 11.1–15.9)
IMMATURE GRANULOCYTES: 0 %
Immature Grans (Abs): 0 10*3/uL (ref 0.0–0.1)
LYMPHS ABS: 1 10*3/uL (ref 0.7–3.1)
Lymphs: 26 %
MCH: 33.8 pg — AB (ref 26.6–33.0)
MCHC: 34.7 g/dL (ref 31.5–35.7)
MCV: 97 fL (ref 79–97)
MONOCYTES: 6 %
Monocytes Absolute: 0.3 10*3/uL (ref 0.1–0.9)
Neutrophils Absolute: 2.5 10*3/uL (ref 1.4–7.0)
Neutrophils Relative %: 66 %
RBC: 3.46 x10E6/uL — ABNORMAL LOW (ref 3.77–5.28)
RDW: 13.2 % (ref 12.3–15.4)
WBC: 3.9 10*3/uL (ref 3.4–10.8)

## 2013-08-20 LAB — POTASSIUM: Potassium: 3.6 mmol/L (ref 3.5–5.2)

## 2013-08-22 LAB — SPECIMEN STATUS REPORT

## 2013-08-22 LAB — PLATELET COUNT: Platelets: 71 10*3/uL — CL (ref 150–379)

## 2013-09-01 ENCOUNTER — Telehealth: Payer: Self-pay | Admitting: Family Medicine

## 2013-09-01 DIAGNOSIS — D696 Thrombocytopenia, unspecified: Secondary | ICD-10-CM

## 2013-09-01 DIAGNOSIS — D638 Anemia in other chronic diseases classified elsewhere: Secondary | ICD-10-CM

## 2013-09-01 NOTE — Telephone Encounter (Signed)
Message copied by Abner Greenspan on Mon Sep 01, 2013  4:50 PM ------      Message from: Tammi Sou      Created: Mon Sep 01, 2013  3:41 PM       Pt does want to see hematologist, pt has seen Dr. Grayland Ormond at [facility] and would like to go back to him, I advise pt that Dr. Glori Bickers will put referral in and Marion/Linda will call to schedule appt. ------

## 2013-09-01 NOTE — Telephone Encounter (Signed)
Referral done

## 2013-09-09 ENCOUNTER — Telehealth: Payer: Self-pay

## 2013-09-09 NOTE — Telephone Encounter (Signed)
Pt left v/m requesting cb about referral requested in June; Bonnita Nasuti will work on referral and contact pt.

## 2013-09-18 ENCOUNTER — Encounter: Payer: Self-pay | Admitting: Gastroenterology

## 2013-09-18 ENCOUNTER — Other Ambulatory Visit: Payer: Self-pay | Admitting: Neurology

## 2013-09-18 DIAGNOSIS — H538 Other visual disturbances: Secondary | ICD-10-CM

## 2013-09-18 DIAGNOSIS — R269 Unspecified abnormalities of gait and mobility: Secondary | ICD-10-CM

## 2013-09-29 ENCOUNTER — Ambulatory Visit
Admission: RE | Admit: 2013-09-29 | Discharge: 2013-09-29 | Disposition: A | Discharge status: Home / Self Care | Payer: Managed Care, Other (non HMO) | Source: Ambulatory Visit | Attending: Neurology | Admitting: Neurology

## 2013-09-29 DIAGNOSIS — H538 Other visual disturbances: Secondary | ICD-10-CM

## 2013-09-29 DIAGNOSIS — R269 Unspecified abnormalities of gait and mobility: Secondary | ICD-10-CM

## 2013-09-29 MED ORDER — GADOBENATE DIMEGLUMINE 529 MG/ML IV SOLN
15.0000 mL | Freq: Once | INTRAVENOUS | Status: AC | PRN
  Administered 2013-09-29: 15 mL via INTRAVENOUS

## 2013-09-30 ENCOUNTER — Telehealth: Payer: Self-pay

## 2013-09-30 NOTE — Telephone Encounter (Signed)
Pt left v/m; pt was seen at [facility] clinic and had lab testing; BUN and electrolytes were abnormal and [facility] is sending Dr Glori Bickers copy of lab results. Lelania request cb to see if anything needs to be done after Dr Glori Bickers reviews lab results.

## 2013-09-30 NOTE — Telephone Encounter (Signed)
Potassium is slightly low Sodium is significantly low  Bilirubin high (liver) as well as AST  Please ask if any vomiting or diarrhea lately Please ask how much alcohol (if any) she has had lately  Let me know and we will make a plan-thanks

## 2013-09-30 NOTE — Telephone Encounter (Signed)
Left message with spouse requesting pt to call office back 

## 2013-10-01 NOTE — Telephone Encounter (Signed)
Pt left v/m requesting cb. 

## 2013-10-02 NOTE — Telephone Encounter (Signed)
Pt notified of lab results and Dr. Marliss Coots comments. Pt said she hasn't had any diarrhea but she has vomited a few times in the morning. Pt said she wakes up with flem in her mouth and it makes her vomit. Pt said she hasn't had any alcohol lately because doctor who did labs told her to stop but before then she was drinking 1-2 drinks of alcohol a week, pt wanted to know if there is a medication that can help her liver function/ bilirubin

## 2013-10-02 NOTE — Telephone Encounter (Signed)
Pt left v/m requesting cb. 

## 2013-10-03 NOTE — Telephone Encounter (Signed)
I want to repeat her labs when able - if the sodium level does not come back up we may need to hold lasix or cut dose for a while  If vomiting is a regular occurrence - we need to get her back to GI The best thing to do for her liver function is avoid otc meds (esp with acetaminophen) and avoid alcohol completely Please schedule a lab appt for bmet and hepatic fxn when able

## 2013-10-03 NOTE — Telephone Encounter (Signed)
Lab appt scheduled and pt notified of Dr. Marliss Coots comments. Pt said she doesn't think her vomiting is bad enough to go to GI, I advise pt that if sxs worsen she needs to let us know pt verbalized understanding.   Pt did want me to let Dr. Glori Bickers know that her insurance is changing next year and her deductible will be around $7000 so she may not be able to f/u with her doctors on a regular

## 2013-10-07 ENCOUNTER — Other Ambulatory Visit: Payer: Self-pay | Admitting: Family Medicine

## 2013-10-07 DIAGNOSIS — R7989 Other specified abnormal findings of blood chemistry: Secondary | ICD-10-CM

## 2013-10-10 ENCOUNTER — Other Ambulatory Visit: Payer: Managed Care, Other (non HMO)

## 2013-10-13 ENCOUNTER — Other Ambulatory Visit: Payer: Managed Care, Other (non HMO)

## 2013-10-13 DIAGNOSIS — R7989 Other specified abnormal findings of blood chemistry: Secondary | ICD-10-CM

## 2013-10-14 ENCOUNTER — Ambulatory Visit: Payer: Self-pay | Admitting: Oncology

## 2013-10-14 LAB — BASIC METABOLIC PANEL
BUN / CREAT RATIO: 15 (ref 9–23)
BUN: 10 mg/dL (ref 6–24)
CHLORIDE: 80 mmol/L — AB (ref 97–108)
CO2: 32 mmol/L — ABNORMAL HIGH (ref 18–29)
Calcium: 8.2 mg/dL — ABNORMAL LOW (ref 8.7–10.2)
Creatinine, Ser: 0.68 mg/dL (ref 0.57–1.00)
GFR calc non Af Amer: 97 mL/min/{1.73_m2} (ref >59)
GFR, EST AFRICAN AMERICAN: 112 mL/min/{1.73_m2} (ref >59)
GLUCOSE: 108 mg/dL — AB (ref 65–99)
Potassium: 3.1 mmol/L — ABNORMAL LOW (ref 3.5–5.2)
SODIUM: 135 mmol/L (ref 134–144)

## 2013-10-14 LAB — HEPATIC FUNCTION PANEL
ALBUMIN: 4.2 g/dL (ref 3.5–5.5)
ALK PHOS: 82 IU/L (ref 39–117)
ALT: 17 IU/L (ref 0–32)
AST: 55 IU/L — ABNORMAL HIGH (ref 0–40)
Bilirubin, Direct: 0.61 mg/dL — ABNORMAL HIGH (ref 0.00–0.40)
Total Bilirubin: 1.5 mg/dL — ABNORMAL HIGH (ref 0.0–1.2)
Total Protein: 7.6 g/dL (ref 6.0–8.5)

## 2013-10-21 ENCOUNTER — Ambulatory Visit: Payer: Self-pay | Admitting: Oncology

## 2013-11-13 ENCOUNTER — Other Ambulatory Visit: Payer: Self-pay | Admitting: Family Medicine

## 2013-11-13 DIAGNOSIS — R7989 Other specified abnormal findings of blood chemistry: Secondary | ICD-10-CM

## 2013-11-16 ENCOUNTER — Emergency Department: Payer: Self-pay | Admitting: Emergency Medicine

## 2013-11-16 LAB — COMPREHENSIVE METABOLIC PANEL
ALBUMIN: 3.4 g/dL (ref 3.4–5.0)
ANION GAP: 13 (ref 7–16)
AST: 73 U/L — AB (ref 15–37)
Alkaline Phosphatase: 89 U/L
BUN: 17 mg/dL (ref 7–18)
Bilirubin,Total: 1.2 mg/dL — ABNORMAL HIGH (ref 0.2–1.0)
CREATININE: 0.91 mg/dL (ref 0.60–1.30)
Calcium, Total: 8.2 mg/dL — ABNORMAL LOW (ref 8.5–10.1)
Chloride: 86 mmol/L — ABNORMAL LOW (ref 98–107)
Co2: 30 mmol/L (ref 21–32)
EGFR (African American): >60
EGFR (Non-African Amer.): >60
GLUCOSE: 92 mg/dL (ref 65–99)
OSMOLALITY: 260 (ref 275–301)
Potassium: 2.7 mmol/L — ABNORMAL LOW (ref 3.5–5.1)
SGPT (ALT): 27 U/L
SODIUM: 129 mmol/L — AB (ref 136–145)
TOTAL PROTEIN: 7.7 g/dL (ref 6.4–8.2)

## 2013-11-16 LAB — CBC WITH DIFFERENTIAL/PLATELET
Basophil #: 0 10*3/uL (ref 0.0–0.1)
Basophil %: 0.7 %
EOS PCT: 1.3 %
Eosinophil #: 0.1 10*3/uL (ref 0.0–0.7)
HCT: 34.6 % — ABNORMAL LOW (ref 35.0–47.0)
HGB: 11.8 g/dL — ABNORMAL LOW (ref 12.0–16.0)
LYMPHS PCT: 29.4 %
Lymphocyte #: 2.1 10*3/uL (ref 1.0–3.6)
MCH: 34.8 pg — AB (ref 26.0–34.0)
MCHC: 34.2 g/dL (ref 32.0–36.0)
MCV: 102 fL — AB (ref 80–100)
Monocyte #: 0.5 x10 3/mm (ref 0.2–0.9)
Monocyte %: 7 %
NEUTROS ABS: 4.5 10*3/uL (ref 1.4–6.5)
NEUTROS PCT: 61.6 %
Platelet: 58 10*3/uL — ABNORMAL LOW (ref 150–440)
RBC: 3.4 10*6/uL — ABNORMAL LOW (ref 3.80–5.20)
RDW: 13 % (ref 11.5–14.5)
WBC: 7.3 10*3/uL (ref 3.6–11.0)

## 2013-11-16 LAB — ETHANOL: ETHANOL LVL: 342 mg/dL — AB

## 2013-11-17 ENCOUNTER — Telehealth: Payer: Self-pay | Admitting: Family Medicine

## 2013-11-17 NOTE — Telephone Encounter (Signed)
Pt would like a return call. Would not tell me what it was regarding only that she wanted [facility] to call her back.

## 2013-11-18 ENCOUNTER — Other Ambulatory Visit: Payer: Managed Care, Other (non HMO)

## 2013-11-18 ENCOUNTER — Telehealth: Payer: Self-pay | Admitting: Family Medicine

## 2013-11-18 NOTE — Telephone Encounter (Signed)
Addressed through another phone note 

## 2013-11-18 NOTE — Telephone Encounter (Signed)
Pt calling back. Would like for [facility] to call her. Pt would not state what she needs, says its personal.

## 2013-11-18 NOTE — Telephone Encounter (Signed)
Pt advise me she had a fall and has ?15 sutures in her head. Pt needed to schedule a f/u to have them removed in 7-10 days. I scheduled appt. for 11/24/13. Also pt was suppose to have a cmet lab done today but due to the fall she couldn't come in I cancelled the appt and she just wants to have her labs done on 11/24/13 when she comes in for an office visit

## 2013-11-18 NOTE — Telephone Encounter (Signed)
Thanks for the update - I will see her then 

## 2013-11-24 ENCOUNTER — Ambulatory Visit (INDEPENDENT_AMBULATORY_CARE_PROVIDER_SITE_OTHER): Payer: Managed Care, Other (non HMO) | Admitting: Family Medicine

## 2013-11-24 ENCOUNTER — Encounter: Payer: Self-pay | Admitting: Family Medicine

## 2013-11-24 VITALS — BP 128/78 | HR 100 | Temp 98.0°F | Ht 62.5 in | Wt 168.5 lb

## 2013-11-24 DIAGNOSIS — S0990XA Unspecified injury of head, initial encounter: Secondary | ICD-10-CM

## 2013-11-24 DIAGNOSIS — F4323 Adjustment disorder with mixed anxiety and depressed mood: Secondary | ICD-10-CM

## 2013-11-24 DIAGNOSIS — E876 Hypokalemia: Secondary | ICD-10-CM

## 2013-11-24 DIAGNOSIS — F1011 Alcohol abuse, in remission: Secondary | ICD-10-CM

## 2013-11-24 DIAGNOSIS — F1010 Alcohol abuse, uncomplicated: Secondary | ICD-10-CM

## 2013-11-24 MED ORDER — CHLORDIAZEPOXIDE HCL 10 MG PO CAPS: 10.0000 mg | ORAL_CAPSULE | Freq: Three times a day (TID) | ORAL | Status: DC | PRN

## 2013-11-24 NOTE — Progress Notes (Signed)
Pre visit review using our clinic review tool, if applicable. No additional management support is needed unless otherwise documented below in the visit note. 

## 2013-11-24 NOTE — Progress Notes (Signed)
Subjective:    Patient ID: Stephanie Raymond, female    DOB: 1955-10-15, 58 y.o.   MRN: [id number]  HPI Pt is here after a fall  Needs sutures removed  She had been drinking  At the door of her bedroom - she hit her head on something - "cracked her head open" Did bleed quite a bit  Her alcohol level was .34     Has not had a headache    Plans on going to [place] meeting - just planning to go to her first meeting on Saturday , and also plans to quit drinking that day  Drinks 1/3 of a bottle of Tequila every day   When she was home recovering - she did quit for a month  Did not notice withdrawal - except craving it  She does not know why she started again  Has cirrhosis   Does not black out from drinking  Does not get hangovers  This fall was alcohol related   She feels quite depressed  Is already on buproprion - 300 mg twice daily  Sleeps for extended periods    Mother and sisters and uncle are alcoholic along with P grandfather   Is taking her K  Due for a level today   Patient Active Problem List   Diagnosis Date Noted  . Hypokalemia 11/24/2013  . Anemia, chronic disease 09/01/2013  . Thrombocytopenia 09/01/2013  . 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 Neck muscle spasm 06/16/2013  . Traumatic hematoma of lower back 06/16/2013  . Wheezing 06/16/2013  . Fatigue 06/16/2013  . Calcification of left breast 10/11/2012  . Encounter for routine gynecological examination 09/30/2012  . Cirrhosis 07/31/2011  . 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 Hemorrhoids 02/28/2011  . Smoker 12/01/2010  . Routine general medical examination at a health care facility 11/30/2010  . Other screening mammogram 11/30/2010  . BACK PAIN, LUMBAR 02/04/2007  . HYPOTHYROIDISM 01/29/2007  . History of alcohol abuse 01/29/2007  . DEPRESSION 01/29/2007  . HYPERTENSION 01/29/2007  . GERD 01/29/2007  . PSORIASIS 01/29/2007  . CARPAL TUNNEL SYNDROME, HX OF 01/29/2007  . TRANSIENT ISCHEMIC ATTACK,  HX OF 01/29/2007   Past Medical History  Diagnosis Date  . Depression   . GERD (gastroesophageal reflux disease)   . Hypertension   . Hypothyroidism   . Hx of transient ischemic attack (TIA) 2000    Hosp ?  [facility]   . History of CT scan 04/1998    small lacunar infarcts  . History of MRI of brain and brain stem 07/1998    white matter changes  . Psoriasis     on humira  . Vasculitis     h/o Henoch-Scholein Purpura   Past Surgical History  Procedure Laterality Date  . Cervical conization w/bx  1980's  . Carotid ultrasound  04/1998    neg  . Foot surgery      toe treated, as a child  . Cervical cone biopsy  02/1999    microinvasive cervical carcinoma  . Partial hysterectomy  02/1999   History  Substance Use Topics  . Smoking status: Current Every Day Smoker -- 0.10 packs/day    Types: Cigarettes  . Smokeless tobacco: Never Used     Comment: 1-2 cigaretts a day  . Alcohol Use: No   Family History  Problem Relation Age of Onset  . Emphysema Mother     smoker  . Heart disease Father 49    CABG  . Heart disease  Other     MI in 34s  . Heart disease Other     CHF  . Hypertension Other   . Colon cancer Neg Hx   . Colon polyps Neg Hx   . Stomach cancer Neg Hx   . Rectal cancer Neg Hx    No Known Allergies Current Outpatient Prescriptions on File Prior to Visit  Medication Sig Dispense Refill  . albuterol (PROVENTIL HFA;VENTOLIN HFA) 108 (90 BASE) MCG/ACT inhaler Inhale 2 puffs into the lungs every 4 (four) hours as needed for wheezing.  3 Inhaler  11  . buPROPion (WELLBUTRIN SR) 150 MG 12 hr tablet Take 2 tablets (300 mg  total) by mouth daily.  180 tablet  1  . cephALEXin (KEFLEX) 500 MG capsule Take 500 mg by mouth 3 (three) times daily.      . Cholecalciferol (VITAMIN D-3 PO) Take 1 capsule by mouth daily.       . furosemide (LASIX) 80 MG tablet Take 1 tablet by mouth  daily.  90 tablet  1  . levothyroxine (SYNTHROID, LEVOTHROID) 50 MCG tablet  Take 1 tablet (50 mcg  total) by mouth daily  before breakfast.  90 tablet  1  . magnesium oxide (MAG-OX) 400 MG tablet Take 800 mg by mouth 2 (two) times daily.      [facility] omeprazole (PRILOSEC) 20 MG capsule Take 1 capsule (20 mg  total) by mouth daily.  90 capsule  1  . potassium chloride (KLOR-CON) 20 MEQ packet Take 1/2 packet prepared as directed once daily  45 packet  1   No current facility-administered medications on file prior to visit.      Review of Systems Review of Systems  Constitutional: Negative for fever, appetite change, and unexpected weight change. pos for fatigue ENT neg for nasal congestion or sinus pain  Eyes: Negative for pain and visual disturbance.  Respiratory: Negative for shortness of breath. Pos for baseline smokers cough  Cardiovascular: Negative for cp or palpitations    Gastrointestinal: Negative for nausea, diarrhea and constipation.  Genitourinary: Negative for urgency and frequency.  Skin: Negative for pallor or rash   Neurological: Negative for weakness, numbness and headaches. pos for baseline gait disorder /ataxia  Hematological: Negative for adenopathy. Does not bruise/bleed easily.  Psychiatric/Behavioral: pos for symptoms of dep /anx , neg for SI        Objective:   Physical Exam  Constitutional: She appears well-developed and well-nourished. No distress.  Frail appearing female with facial bruising   HENT:  Head: Normocephalic.  Right Ear: External ear normal.  Left Ear: External ear normal.  Mouth/Throat: Oropharynx is clear and moist.  Old/resolving ecchymosis noted over R lower face  Also under both eyes   Laceration - well healing noted over mid scalp down into L forehead   Mild tenderness   Eyes: Conjunctivae and EOM are normal. Pupils are equal, round, and reactive to light. Right eye exhibits no discharge. Left eye exhibits no discharge. No scleral icterus.  Neck: Normal range of motion. Neck supple.  Cardiovascular: Normal rate,  regular rhythm and normal heart sounds.   Pulmonary/Chest: Effort normal and breath sounds normal. No respiratory distress. She has no wheezes. She has no rales.  Diffusely distant bs   Musculoskeletal: She exhibits no edema.  Lymphadenopathy:    She has no cervical adenopathy.  Neurological: She is alert. She has normal reflexes. She displays tremor. No cranial nerve deficit or sensory deficit. She exhibits normal muscle tone. Gait  abnormal. Coordination normal.  Baseline tremor and ataxia   Skin: Skin is warm and dry. No rash noted. No erythema. No pallor.  No jaundice   Long laceration from mid scalp to L forehead with 15 simple interrupted sutures  Good skin edge approx No s/s of infection  Psychiatric: Her speech is normal and behavior is normal. Her mood appears anxious. Her affect is not labile and not inappropriate. Thought content is not paranoid. She exhibits a depressed mood. She expresses no homicidal and no suicidal ideation.  Pleasant  Is open to discussing stressors and alcoholism           Assessment & Plan:   Problem List Items Addressed This Visit     Other   Alcohol abuse     Pt finally admits that this continues to be a problem- mod to heavy drinking daily  Supportive spouse present- rev plan to quit alcohol on Sat and begin AA that day- she has located meetings  ETOH did cause this past fall and does worsen her gait disorder  Given px for librium for w/d symptoms and anxiety  Disc risk of seizures and CT symptoms - husb will ask for more help if needed  Close f/u planned >25 minutes spent in face to face time with patient, >50% spent in counselling or coordination of care     Adjustment disorder with mixed anxiety and depressed mood     Pt is having more symptoms aggrivated by her alcohol abuse  Has a set plan to stop drinking sat and has first AA meeting to go to  Seems motivated  Will continue bupropion  Given px for librium to use for anxiety and  withdrawal     Hypokalemia     Pt overdue for lab re check of this  No n/v/d and she has been compliant with her K     Relevant Orders      Comprehensive metabolic panel   Head injury - Primary     S/p fall and sutures  15 simple interrupted sutures removed from head and forehead with good skin approx and no signs of infection No concussion symptoms - will continue to watch this carefully  Disc what to watch for

## 2013-11-24 NOTE — Assessment & Plan Note (Signed)
Pt overdue for lab re check of this  No n/v/d and she has been compliant with her K

## 2013-11-24 NOTE — Patient Instructions (Signed)
Labs today  I am so glad you will start AA on Saturday  Here is a px for librium to use 1-2 pills up to three times daily for withdrawal (will also help you sleep)  Use caution -it will sedate you  Please use caution for fall prevention   Follow up with me in 3-4 weeks please

## 2013-11-24 NOTE — Assessment & Plan Note (Signed)
Pt is having more symptoms aggrivated by her alcohol abuse  Has a set plan to stop drinking sat and has first AA meeting to go to  Seems motivated  Will continue bupropion  Given px for librium to use for anxiety and withdrawal

## 2013-11-24 NOTE — Assessment & Plan Note (Signed)
Pt finally admits that this continues to be a problem- mod to heavy drinking daily  Supportive spouse present- rev plan to quit alcohol on Sat and begin AA that day- she has located meetings  ETOH did cause this past fall and does worsen her gait disorder  Given px for librium for w/d symptoms and anxiety  Disc risk of seizures and CT symptoms - husb will ask for more help if needed  Close f/u planned >25 minutes spent in face to face time with patient, >50% spent in counselling or coordination of care

## 2013-11-24 NOTE — Assessment & Plan Note (Signed)
S/p fall and sutures  15 simple interrupted sutures removed from head and forehead with good skin approx and no signs of infection No concussion symptoms - will continue to watch this carefully  Disc what to watch for

## 2013-11-25 ENCOUNTER — Telehealth: Payer: Self-pay | Admitting: Family Medicine

## 2013-11-25 NOTE — Telephone Encounter (Signed)
emmi emailed °

## 2013-11-26 LAB — COMPREHENSIVE METABOLIC PANEL
A/G RATIO: 1.4 (ref 1.1–2.5)
ALK PHOS: 89 IU/L (ref 39–117)
ALT: 18 IU/L (ref 0–32)
AST: 53 IU/L — ABNORMAL HIGH (ref 0–40)
Albumin: 4.2 g/dL (ref 3.5–5.5)
BILIRUBIN TOTAL: 2.1 mg/dL — AB (ref 0.0–1.2)
BUN / CREAT RATIO: 10 (ref 9–23)
BUN: 9 mg/dL (ref 6–24)
CO2: 31 mmol/L — ABNORMAL HIGH (ref 18–29)
Calcium: 8.7 mg/dL (ref 8.7–10.2)
Chloride: 84 mmol/L — ABNORMAL LOW (ref 97–108)
Creatinine, Ser: 0.92 mg/dL (ref 0.57–1.00)
GFR, EST AFRICAN AMERICAN: 80 mL/min/{1.73_m2} (ref >59)
GFR, EST NON AFRICAN AMERICAN: 69 mL/min/{1.73_m2} (ref >59)
Globulin, Total: 3.1 g/dL (ref 1.5–4.5)
Glucose: 121 mg/dL — ABNORMAL HIGH (ref 65–99)
POTASSIUM: 3 mmol/L — AB (ref 3.5–5.2)
Sodium: 132 mmol/L — ABNORMAL LOW (ref 134–144)
Total Protein: 7.3 g/dL (ref 6.0–8.5)

## 2013-11-28 ENCOUNTER — Encounter: Payer: Self-pay | Admitting: *Deleted

## 2013-12-24 ENCOUNTER — Inpatient Hospital Stay: Payer: Self-pay | Admitting: Internal Medicine

## 2013-12-24 LAB — BASIC METABOLIC PANEL
ANION GAP: 12 (ref 7–16)
BUN: 12 mg/dL (ref 7–18)
CREATININE: 0.9 mg/dL (ref 0.60–1.30)
Calcium, Total: 8.5 mg/dL (ref 8.5–10.1)
Chloride: 96 mmol/L — ABNORMAL LOW (ref 98–107)
Co2: 27 mmol/L (ref 21–32)
EGFR (African American): >60
EGFR (Non-African Amer.): >60
Glucose: 90 mg/dL (ref 65–99)
Osmolality: 269 (ref 275–301)
POTASSIUM: 3.9 mmol/L (ref 3.5–5.1)
Sodium: 135 mmol/L — ABNORMAL LOW (ref 136–145)

## 2013-12-24 LAB — CBC
HCT: 35.5 % (ref 35.0–47.0)
HGB: 12 g/dL (ref 12.0–16.0)
MCH: 35.7 pg — AB (ref 26.0–34.0)
MCHC: 33.9 g/dL (ref 32.0–36.0)
MCV: 105 fL — ABNORMAL HIGH (ref 80–100)
Platelet: 60 10*3/uL — ABNORMAL LOW (ref 150–440)
RBC: 3.37 10*6/uL — ABNORMAL LOW (ref 3.80–5.20)
RDW: 13.7 % (ref 11.5–14.5)
WBC: 6.8 10*3/uL (ref 3.6–11.0)

## 2013-12-24 LAB — URINALYSIS, COMPLETE
BLOOD: NEGATIVE
Bilirubin,UR: NEGATIVE
Glucose,UR: NEGATIVE mg/dL (ref 0–75)
KETONE: NEGATIVE
Leukocyte Esterase: NEGATIVE
Nitrite: NEGATIVE
Ph: 6 (ref 4.5–8.0)
Protein: NEGATIVE
RBC,UR: 1 /HPF (ref 0–5)
Specific Gravity: 1.036 (ref 1.003–1.030)

## 2013-12-24 LAB — HEPATIC FUNCTION PANEL A (ARMC)
ALT: 74 U/L — AB
AST: 363 U/L — AB (ref 15–37)
Albumin: 3.4 g/dL (ref 3.4–5.0)
Alkaline Phosphatase: 122 U/L — ABNORMAL HIGH
Bilirubin, Direct: 2 mg/dL — ABNORMAL HIGH (ref 0.0–0.2)
Bilirubin,Total: 2.8 mg/dL — ABNORMAL HIGH (ref 0.2–1.0)
Total Protein: 7.6 g/dL (ref 6.4–8.2)

## 2013-12-24 LAB — DRUG SCREEN, URINE
Amphetamines, Ur Screen: NEGATIVE (ref ?–1000)
BARBITURATES, UR SCREEN: NEGATIVE (ref ?–200)
Benzodiazepine, Ur Scrn: NEGATIVE (ref ?–200)
Cannabinoid 50 Ng, Ur ~~LOC~~: NEGATIVE (ref ?–50)
Cocaine Metabolite,Ur ~~LOC~~: NEGATIVE (ref ?–300)
MDMA (ECSTASY) UR SCREEN: POSITIVE (ref ?–500)
Methadone, Ur Screen: NEGATIVE (ref ?–300)
Opiate, Ur Screen: NEGATIVE (ref ?–300)
Phencyclidine (PCP) Ur S: NEGATIVE (ref ?–25)
TRICYCLIC, UR SCREEN: NEGATIVE (ref ?–1000)

## 2013-12-24 LAB — ETHANOL: Ethanol: 109 mg/dL

## 2013-12-24 LAB — CK TOTAL AND CKMB (NOT AT ARMC)
CK, TOTAL: 62 U/L
CK, Total: 125 U/L
CK, Total: 58 U/L
CK-MB: 1.8 ng/mL (ref 0.5–3.6)
CK-MB: 1.4 ng/mL (ref 0.5–3.6)
CK-MB: 1 ng/mL (ref 0.5–3.6)

## 2013-12-24 LAB — TROPONIN I
Troponin-I: <0.02 ng/mL
Troponin-I: <0.02 ng/mL
Troponin-I: <0.02 ng/mL

## 2013-12-24 LAB — D-DIMER(ARMC): D-Dimer: 1755 ng/ml

## 2013-12-24 LAB — LIPASE, BLOOD: LIPASE: 519 U/L — AB (ref 73–393)

## 2013-12-25 LAB — BASIC METABOLIC PANEL
Anion Gap: 8 (ref 7–16)
BUN: 12 mg/dL (ref 7–18)
CO2: 26 mmol/L (ref 21–32)
CREATININE: 0.88 mg/dL (ref 0.60–1.30)
Calcium, Total: 7.4 mg/dL — ABNORMAL LOW (ref 8.5–10.1)
Chloride: 106 mmol/L (ref 98–107)
GLUCOSE: 97 mg/dL (ref 65–99)
Osmolality: 279 (ref 275–301)
POTASSIUM: 3.8 mmol/L (ref 3.5–5.1)
Sodium: 140 mmol/L (ref 136–145)

## 2013-12-25 LAB — LIPID PANEL
Cholesterol: 80 mg/dL (ref 0–200)
HDL Cholesterol: 38 mg/dL — ABNORMAL LOW (ref 40–60)
Ldl Cholesterol, Calc: 31 mg/dL (ref 0–100)
Triglycerides: 55 mg/dL (ref 0–200)
VLDL CHOLESTEROL, CALC: 11 mg/dL (ref 5–40)

## 2013-12-25 LAB — CBC WITH DIFFERENTIAL/PLATELET
Basophil #: 0 10*3/uL (ref 0.0–0.1)
Basophil %: 0.2 %
EOS ABS: 0 10*3/uL (ref 0.0–0.7)
EOS PCT: 0 %
HCT: 29.7 % — ABNORMAL LOW (ref 35.0–47.0)
HGB: 9.9 g/dL — AB (ref 12.0–16.0)
Lymphocyte #: 0.2 10*3/uL — ABNORMAL LOW (ref 1.0–3.6)
Lymphocyte %: 4.8 %
MCH: 35.8 pg — ABNORMAL HIGH (ref 26.0–34.0)
MCHC: 33.3 g/dL (ref 32.0–36.0)
MCV: 107 fL — ABNORMAL HIGH (ref 80–100)
MONO ABS: 0.2 x10 3/mm (ref 0.2–0.9)
MONOS PCT: 4.4 %
NEUTROS ABS: 4.5 10*3/uL (ref 1.4–6.5)
NEUTROS PCT: 90.6 %
Platelet: 34 10*3/uL — ABNORMAL LOW (ref 150–440)
RBC: 2.77 10*6/uL — ABNORMAL LOW (ref 3.80–5.20)
RDW: 13.7 % (ref 11.5–14.5)
WBC: 5 10*3/uL (ref 3.6–11.0)

## 2013-12-25 LAB — AMMONIA: Ammonia, Plasma: 34 mcmol/L — ABNORMAL HIGH (ref 11–32)

## 2013-12-25 LAB — TSH: Thyroid Stimulating Horm: 0.969 u[IU]/mL

## 2013-12-26 LAB — MAGNESIUM: Magnesium: 1.6 mg/dL — ABNORMAL LOW

## 2013-12-26 LAB — BASIC METABOLIC PANEL
ANION GAP: 8 (ref 7–16)
BUN: 14 mg/dL (ref 7–18)
CHLORIDE: 105 mmol/L (ref 98–107)
CO2: 25 mmol/L (ref 21–32)
CREATININE: 1.61 mg/dL — AB (ref 0.60–1.30)
Calcium, Total: 8 mg/dL — ABNORMAL LOW (ref 8.5–10.1)
GFR CALC AF AMER: 42 — AB
GFR CALC NON AF AMER: 35 — AB
Glucose: 87 mg/dL (ref 65–99)
Osmolality: 276 (ref 275–301)
POTASSIUM: 3.7 mmol/L (ref 3.5–5.1)
SODIUM: 138 mmol/L (ref 136–145)

## 2013-12-26 LAB — CBC WITH DIFFERENTIAL/PLATELET
Basophil #: 0 10*3/uL (ref 0.0–0.1)
Basophil %: 0.2 %
Eosinophil #: 0 10*3/uL (ref 0.0–0.7)
Eosinophil %: 0.4 %
HCT: 28.4 % — AB (ref 35.0–47.0)
HGB: 9.4 g/dL — ABNORMAL LOW (ref 12.0–16.0)
Lymphocyte #: 0.2 10*3/uL — ABNORMAL LOW (ref 1.0–3.6)
Lymphocyte %: 5.6 %
MCH: 35.6 pg — AB (ref 26.0–34.0)
MCHC: 33 g/dL (ref 32.0–36.0)
MCV: 108 fL — AB (ref 80–100)
MONOS PCT: 4.9 %
Monocyte #: 0.2 x10 3/mm (ref 0.2–0.9)
NEUTROS PCT: 88.9 %
Neutrophil #: 2.8 10*3/uL (ref 1.4–6.5)
Platelet: 33 10*3/uL — ABNORMAL LOW (ref 150–440)
RBC: 2.63 10*6/uL — ABNORMAL LOW (ref 3.80–5.20)
RDW: 13.5 % (ref 11.5–14.5)
WBC: 3.1 10*3/uL — AB (ref 3.6–11.0)

## 2013-12-26 LAB — PHOSPHORUS: PHOSPHORUS: 2.1 mg/dL — AB (ref 2.5–4.9)

## 2013-12-26 LAB — LIPASE, BLOOD: Lipase: 127 U/L (ref 73–393)

## 2013-12-27 LAB — BASIC METABOLIC PANEL
Anion Gap: 10 (ref 7–16)
BUN: 11 mg/dL (ref 7–18)
CALCIUM: 8.5 mg/dL (ref 8.5–10.1)
Chloride: 103 mmol/L (ref 98–107)
Co2: 24 mmol/L (ref 21–32)
Creatinine: 0.82 mg/dL (ref 0.60–1.30)
Glucose: 105 mg/dL — ABNORMAL HIGH (ref 65–99)
OSMOLALITY: 274 (ref 275–301)
Potassium: 3 mmol/L — ABNORMAL LOW (ref 3.5–5.1)
Sodium: 137 mmol/L (ref 136–145)

## 2013-12-27 LAB — POTASSIUM: Potassium: 3.2 mmol/L — ABNORMAL LOW (ref 3.5–5.1)

## 2013-12-27 LAB — MAGNESIUM: MAGNESIUM: 1.9 mg/dL

## 2013-12-27 LAB — COMPREHENSIVE METABOLIC PANEL
ALT: 44 U/L
Albumin: 2.9 g/dL — ABNORMAL LOW (ref 3.4–5.0)
Alkaline Phosphatase: 107 U/L
Bilirubin,Total: 1.9 mg/dL — ABNORMAL HIGH (ref 0.2–1.0)
SGOT(AST): 54 U/L — ABNORMAL HIGH (ref 15–37)
TOTAL PROTEIN: 7.1 g/dL (ref 6.4–8.2)

## 2013-12-27 LAB — PHOSPHORUS
PHOSPHORUS: 3 mg/dL (ref 2.5–4.9)
Phosphorus: 2.2 mg/dL — ABNORMAL LOW (ref 2.5–4.9)

## 2013-12-28 LAB — POTASSIUM: Potassium: 3.9 mmol/L (ref 3.5–5.1)

## 2013-12-28 LAB — CBC WITH DIFFERENTIAL/PLATELET
Basophil #: 0 x10 3/mm 3
Basophil %: 0.7 %
Eosinophil #: 0 x10 3/mm 3
Eosinophil %: 1.5 %
HCT: 32 % — ABNORMAL LOW
HGB: 10.7 g/dL — ABNORMAL LOW
Lymphocyte %: 14.5 %
Lymphs Abs: 0.5 x10 3/mm 3 — ABNORMAL LOW
MCH: 35.2 pg — ABNORMAL HIGH
MCHC: 33.5 g/dL
MCV: 105 fL — ABNORMAL HIGH
Monocyte #: 0.3 "x10 3/mm "
Monocyte %: 8.8 %
Neutrophil #: 2.4 x10 3/mm 3
Neutrophil %: 74.5 %
Platelet: 43 x10 3/mm 3 — ABNORMAL LOW
RBC: 3.04 X10 6/mm 3 — ABNORMAL LOW
RDW: 13.4 %
WBC: 3.2 x10 3/mm 3 — ABNORMAL LOW

## 2013-12-28 LAB — BASIC METABOLIC PANEL
ANION GAP: 10 (ref 7–16)
BUN: 8 mg/dL (ref 7–18)
Calcium, Total: 8.2 mg/dL — ABNORMAL LOW (ref 8.5–10.1)
Chloride: 105 mmol/L (ref 98–107)
Co2: 24 mmol/L (ref 21–32)
Creatinine: 0.8 mg/dL (ref 0.60–1.30)
EGFR (African American): >60
EGFR (Non-African Amer.): >60
Glucose: 81 mg/dL (ref 65–99)
Osmolality: 275 (ref 275–301)
Potassium: 3.2 mmol/L — ABNORMAL LOW (ref 3.5–5.1)
Sodium: 139 mmol/L (ref 136–145)

## 2013-12-28 LAB — MAGNESIUM
MAGNESIUM: 1.2 mg/dL — AB
Magnesium: 1.4 mg/dL — ABNORMAL LOW

## 2013-12-29 ENCOUNTER — Other Ambulatory Visit: Payer: Self-pay

## 2013-12-29 LAB — BASIC METABOLIC PANEL
Anion Gap: 10 (ref 7–16)
BUN: 10 mg/dL (ref 7–18)
CALCIUM: 8.4 mg/dL — AB (ref 8.5–10.1)
CO2: 23 mmol/L (ref 21–32)
CREATININE: 0.96 mg/dL (ref 0.60–1.30)
Chloride: 105 mmol/L (ref 98–107)
EGFR (African American): >60
Glucose: 98 mg/dL (ref 65–99)
Osmolality: 275 (ref 275–301)
Potassium: 2.9 mmol/L — ABNORMAL LOW (ref 3.5–5.1)
Sodium: 138 mmol/L (ref 136–145)

## 2013-12-29 LAB — MAGNESIUM
Magnesium: 1.7 mg/dL — ABNORMAL LOW
Magnesium: 1.6 mg/dL — ABNORMAL LOW

## 2013-12-29 LAB — CULTURE, BLOOD (SINGLE)

## 2013-12-29 LAB — PHOSPHORUS: PHOSPHORUS: 2.8 mg/dL (ref 2.5–4.9)

## 2013-12-29 LAB — AMMONIA: Ammonia, Plasma: 37 mcmol/L — ABNORMAL HIGH (ref 11–32)

## 2013-12-29 LAB — POTASSIUM: Potassium: 4.7 mmol/L (ref 3.5–5.1)

## 2013-12-29 MED ORDER — POTASSIUM CHLORIDE 20 MEQ PO PACK: PACK | ORAL | Status: DC

## 2013-12-29 NOTE — Telephone Encounter (Signed)
Please schedule lab appt when able  make sure she is taking 1 packet per day and call it in -thanks

## 2013-12-29 NOTE — Telephone Encounter (Signed)
Mr Blowers left v/m requesting refill K packet to [facility]. Med list has 1/2 packet daily and 11/24/13 lab result note has pt advised to increase K to 1 packet / day and reck bmet 1 -2 weeks. No lab appt scheduled. Please advise.

## 2013-12-30 LAB — BASIC METABOLIC PANEL
Anion Gap: 11 (ref 7–16)
BUN: 17 mg/dL (ref 7–18)
CALCIUM: 9.2 mg/dL (ref 8.5–10.1)
CHLORIDE: 102 mmol/L (ref 98–107)
CREATININE: 1.04 mg/dL (ref 0.60–1.30)
Co2: 24 mmol/L (ref 21–32)
GFR CALC NON AF AMER: 58 — AB
Glucose: 98 mg/dL (ref 65–99)
Osmolality: 275 (ref 275–301)
POTASSIUM: 4 mmol/L (ref 3.5–5.1)
SODIUM: 137 mmol/L (ref 136–145)

## 2013-12-30 LAB — PHOSPHORUS: Phosphorus: 2.7 mg/dL (ref 2.5–4.9)

## 2013-12-30 LAB — MAGNESIUM: MAGNESIUM: 1.6 mg/dL — AB

## 2013-12-30 MED ORDER — POTASSIUM CHLORIDE 20 MEQ PO PACK: PACK | ORAL | Status: DC

## 2013-12-30 NOTE — Telephone Encounter (Signed)
I will review [place] before I respond to the wellbutrin question

## 2013-12-30 NOTE — Telephone Encounter (Signed)
Pt notified Rx sent to pharmacy, pt said she is still taking 1 packet per day. Pt declined to schedule a lab appt because she just got out of hospital and they did labs there. I requested notes from [facility]  Pt did want me to ask you if you could change her antidepressant med pt said the Wellbutrin isn't working anymore, pt declined to schedule an appt. (hospital f/u or discuss meds), pt wanted me to send you the note 1st and see what you say

## 2013-12-31 NOTE — Telephone Encounter (Signed)
Thanks - I have some d/c papers and her H and P for admission -but still need her discharge summary if /when that is avail -please call for that if they have it

## 2013-12-31 NOTE — Telephone Encounter (Signed)
Records received and placed in your inbox.

## 2014-01-01 ENCOUNTER — Telehealth: Payer: Self-pay

## 2014-01-01 MED ORDER — CHLORDIAZEPOXIDE HCL 25 MG PO CAPS: ORAL_CAPSULE | ORAL | Status: DC

## 2014-01-01 NOTE — Telephone Encounter (Signed)
I think while during withdrawal she may just need a higher dose - given that however, no amount of medicine will necessarily help her sleep- the libruim is primarily to ward of seizures from alcohol withdrawal as she goes through DTs  The insomnia will improve with time  I think we should move up to 25 mg pills - take 1-2 pills up to every 4 hours  Please call this in  Have her f/u with me within the next week  I did not have the discharge summary from [facility] as of last night - it may be there now (I did read her H and P)

## 2014-01-01 NOTE — Telephone Encounter (Signed)
Pt said last week pt was in hospital for DTs and since discharged pt has been taking the Librium; last night pt could not sleep at all; pt took Librium 10 mg taking 4 capsules between 10 PM and midnight and pt still could not sleep. Pt request cb from [facility] .

## 2014-01-02 NOTE — Telephone Encounter (Signed)
Left voicemail requesting pt to call office back 

## 2014-01-05 NOTE — Telephone Encounter (Signed)
Can you actually talk to a human in med records there? Perhaps they can tell you if the d/c summary is even done yet? Thanks

## 2014-01-05 NOTE — Telephone Encounter (Signed)
I have sent 2 request for d/c summary with no response

## 2014-01-05 NOTE — Telephone Encounter (Signed)
Pt left v/m requesting cb. 

## 2014-01-06 NOTE — Telephone Encounter (Signed)
Left voicemail requesting pt to call office 

## 2014-01-07 NOTE — Telephone Encounter (Signed)
Pt left v/m requesting cb at [phone].

## 2014-01-08 NOTE — Telephone Encounter (Signed)
Pt left v/m requesting cb at [phone].

## 2014-01-13 NOTE — Telephone Encounter (Signed)
Pt is coming in for a f/u with Dr. Glori Bickers next week to discuss this. We never received a d/c summary

## 2014-01-13 NOTE — Telephone Encounter (Signed)
Thanks for trying -please refill times one if not done already  Let Vincente Liberty know we are having trouble getting her d/c summary please  thanks

## 2014-01-13 NOTE — Telephone Encounter (Signed)
Pt left v/m requesting cb today.Please advise.

## 2014-01-13 NOTE — Telephone Encounter (Signed)
Left 2 voicemail requesting d/c summary, can't speak to someone we can only leave a voicemail

## 2014-01-13 NOTE — Telephone Encounter (Signed)
Pt notified of Dr. Marliss Coots comments, Rx called to pharmacy as prescribed, and f/u appt scheduled

## 2014-01-16 ENCOUNTER — Emergency Department: Payer: Self-pay | Admitting: Emergency Medicine

## 2014-01-16 LAB — URINALYSIS, COMPLETE
BILIRUBIN, UR: NEGATIVE
Bacteria: NONE SEEN
Blood: NEGATIVE
GLUCOSE, UR: NEGATIVE mg/dL (ref 0–75)
Hyaline Cast: 2
Ketone: NEGATIVE
Leukocyte Esterase: NEGATIVE
Nitrite: NEGATIVE
PH: 5 (ref 4.5–8.0)
Protein: NEGATIVE
RBC, UR: NONE SEEN /HPF (ref 0–5)
Specific Gravity: 1.008 (ref 1.003–1.030)
Squamous Epithelial: NONE SEEN
WBC UR: NONE SEEN /HPF (ref 0–5)

## 2014-01-16 LAB — COMPREHENSIVE METABOLIC PANEL
ALK PHOS: 98 U/L
Albumin: 3.1 g/dL — ABNORMAL LOW (ref 3.4–5.0)
Anion Gap: 9 (ref 7–16)
BILIRUBIN TOTAL: 1 mg/dL (ref 0.2–1.0)
BUN: 13 mg/dL (ref 7–18)
Calcium, Total: 8.4 mg/dL — ABNORMAL LOW (ref 8.5–10.1)
Chloride: 96 mmol/L — ABNORMAL LOW (ref 98–107)
Co2: 27 mmol/L (ref 21–32)
Creatinine: 1.17 mg/dL (ref 0.60–1.30)
EGFR (African American): >60
EGFR (Non-African Amer.): 51 — ABNORMAL LOW
Glucose: 93 mg/dL (ref 65–99)
Osmolality: 264 (ref 275–301)
Potassium: 3.1 mmol/L — ABNORMAL LOW (ref 3.5–5.1)
SGOT(AST): 22 U/L (ref 15–37)
SGPT (ALT): 19 U/L
Sodium: 132 mmol/L — ABNORMAL LOW (ref 136–145)
Total Protein: 7.5 g/dL (ref 6.4–8.2)

## 2014-01-16 LAB — CBC
HCT: 31.3 % — AB (ref 35.0–47.0)
HGB: 10.4 g/dL — ABNORMAL LOW (ref 12.0–16.0)
MCH: 33.3 pg (ref 26.0–34.0)
MCHC: 33.1 g/dL (ref 32.0–36.0)
MCV: 101 fL — ABNORMAL HIGH (ref 80–100)
PLATELETS: 54 10*3/uL — AB (ref 150–440)
RBC: 3.11 10*6/uL — ABNORMAL LOW (ref 3.80–5.20)
RDW: 12.9 % (ref 11.5–14.5)
WBC: 5.2 10*3/uL (ref 3.6–11.0)

## 2014-01-16 LAB — DRUG SCREEN, URINE
Amphetamines, Ur Screen: NEGATIVE (ref ?–1000)
BARBITURATES, UR SCREEN: NEGATIVE (ref ?–200)
Benzodiazepine, Ur Scrn: POSITIVE (ref ?–200)
COCAINE METABOLITE, UR ~~LOC~~: NEGATIVE (ref ?–300)
Cannabinoid 50 Ng, Ur ~~LOC~~: POSITIVE (ref ?–50)
MDMA (ECSTASY) UR SCREEN: POSITIVE (ref ?–500)
Methadone, Ur Screen: NEGATIVE (ref ?–300)
OPIATE, UR SCREEN: NEGATIVE (ref ?–300)
PHENCYCLIDINE (PCP) UR S: NEGATIVE (ref ?–25)
Tricyclic, Ur Screen: NEGATIVE (ref ?–1000)

## 2014-01-16 LAB — TROPONIN I: Troponin-I: <0.02 ng/mL

## 2014-01-16 LAB — AMMONIA: Ammonia, Plasma: 33 mcmol/L — ABNORMAL HIGH (ref 11–32)

## 2014-01-16 LAB — ETHANOL

## 2014-01-21 ENCOUNTER — Encounter: Payer: Self-pay | Admitting: Family Medicine

## 2014-01-21 ENCOUNTER — Ambulatory Visit (INDEPENDENT_AMBULATORY_CARE_PROVIDER_SITE_OTHER): Payer: Managed Care, Other (non HMO) | Admitting: Family Medicine

## 2014-01-21 VITALS — BP 118/76 | HR 100 | Temp 96.9°F | Ht 62.5 in | Wt 164.8 lb

## 2014-01-21 DIAGNOSIS — E876 Hypokalemia: Secondary | ICD-10-CM

## 2014-01-21 DIAGNOSIS — R270 Ataxia, unspecified: Secondary | ICD-10-CM

## 2014-01-21 DIAGNOSIS — G119 Hereditary ataxia, unspecified: Secondary | ICD-10-CM

## 2014-01-21 DIAGNOSIS — F1010 Alcohol abuse, uncomplicated: Secondary | ICD-10-CM

## 2014-01-21 DIAGNOSIS — K7460 Unspecified cirrhosis of liver: Secondary | ICD-10-CM | POA: Insufficient documentation

## 2014-01-21 DIAGNOSIS — K8020 Calculus of gallbladder without cholecystitis without obstruction: Secondary | ICD-10-CM

## 2014-01-21 DIAGNOSIS — K7030 Alcoholic cirrhosis of liver without ascites: Secondary | ICD-10-CM

## 2014-01-21 DIAGNOSIS — D638 Anemia in other chronic diseases classified elsewhere: Secondary | ICD-10-CM

## 2014-01-21 DIAGNOSIS — K7682 Hepatic encephalopathy: Secondary | ICD-10-CM | POA: Insufficient documentation

## 2014-01-21 DIAGNOSIS — F1011 Alcohol abuse, in remission: Secondary | ICD-10-CM

## 2014-01-21 DIAGNOSIS — K7290 Hepatic failure, unspecified without coma: Secondary | ICD-10-CM
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
## 2014-01-21 NOTE — Progress Notes (Signed)
Pre visit review using our clinic review tool, if applicable. No additional management support is needed unless otherwise documented below in the visit note. 

## 2014-01-21 NOTE — Patient Instructions (Signed)
We will send for records from [facility] today  Continue potassium  Tomorrow when labs return I will decide how much potassium you need and we will change to pills I will also review neuology records (balance issue) Get a walker if you need it

## 2014-01-22 ENCOUNTER — Telehealth: Payer: Self-pay

## 2014-01-22 NOTE — Telephone Encounter (Signed)
Pt was reviewing notes from hospital (pt said she brought copy of notes to Dr Glori Bickers) and pt noticed that benzodiazepam and MDMA were positive. Pt said she is not taking those meds and wants to know if they are contained in some of the meds pt is taking. Pt request cb.

## 2014-01-22 NOTE — Telephone Encounter (Signed)
The benzo was her librium- I will look up the other and get back to her  Please send this back to me Thanks

## 2014-01-23 ENCOUNTER — Telehealth: Payer: Self-pay | Admitting: Family Medicine

## 2014-01-23 LAB — CBC WITH DIFFERENTIAL/PLATELET
BASOS: 1 %
Basophils Absolute: 0 10*3/uL (ref 0.0–0.2)
EOS: 2 %
Eosinophils Absolute: 0.1 10*3/uL (ref 0.0–0.4)
HCT: 29.8 % — ABNORMAL LOW (ref 34.0–46.6)
Hemoglobin: 10.2 g/dL — ABNORMAL LOW (ref 11.1–15.9)
IMMATURE GRANS (ABS): 0 10*3/uL (ref 0.0–0.1)
IMMATURE GRANULOCYTES: 0 %
Lymphocytes Absolute: 1.1 10*3/uL (ref 0.7–3.1)
Lymphs: 23 %
MCH: 32.4 pg (ref 26.6–33.0)
MCHC: 34.2 g/dL (ref 31.5–35.7)
MCV: 95 fL (ref 79–97)
MONOCYTES: 5 %
MONOS ABS: 0.2 10*3/uL (ref 0.1–0.9)
Neutrophils Absolute: 3.1 10*3/uL (ref 1.4–7.0)
Neutrophils Relative %: 69 %
RBC: 3.15 x10E6/uL — AB (ref 3.77–5.28)
RDW: 13.1 % (ref 12.3–15.4)
WBC: 4.5 10*3/uL (ref 3.4–10.8)

## 2014-01-23 LAB — HEPATIC FUNCTION PANEL
ALT: 10 IU/L (ref 0–32)
AST: 22 IU/L (ref 0–40)
Albumin: 3.5 g/dL (ref 3.5–5.5)
Alkaline Phosphatase: 84 IU/L (ref 39–117)
BILIRUBIN DIRECT: 0.29 mg/dL (ref 0.00–0.40)
TOTAL PROTEIN: 7.3 g/dL (ref 6.0–8.5)
Total Bilirubin: 0.6 mg/dL (ref 0.0–1.2)

## 2014-01-23 LAB — BASIC METABOLIC PANEL
BUN/Creatinine Ratio: 15 (ref 9–23)
BUN: 15 mg/dL (ref 6–24)
CALCIUM: 9 mg/dL (ref 8.7–10.2)
CO2: 30 mmol/L — AB (ref 18–29)
CREATININE: 1.01 mg/dL — AB (ref 0.57–1.00)
Chloride: 95 mmol/L — ABNORMAL LOW (ref 97–108)
GFR calc Af Amer: 71 mL/min/{1.73_m2} (ref >59)
GFR, EST NON AFRICAN AMERICAN: 62 mL/min/{1.73_m2} (ref >59)
Glucose: 126 mg/dL — ABNORMAL HIGH (ref 65–99)
Potassium: 3.6 mmol/L (ref 3.5–5.2)
SODIUM: 140 mmol/L (ref 134–144)

## 2014-01-23 MED ORDER — POTASSIUM CHLORIDE CRYS ER 20 MEQ PO TBCR: 20.0000 meq | EXTENDED_RELEASE_TABLET | Freq: Every day | ORAL | Status: DC

## 2014-01-23 NOTE — Telephone Encounter (Signed)
Aware, thanks!

## 2014-01-23 NOTE — Telephone Encounter (Signed)
Pt notified of Dr. Marliss Coots comments. Pt said that her friend gave her crack to try to help her sleep at night but she "only smoked a puff here and there" and choked really bad and said she isn't going to use cocaine anymore and that is why her MDMA was positive

## 2014-01-23 NOTE — Telephone Encounter (Signed)
I don't know about the MDMA- I suspect it may be a lab error - please see notes about her labs that just returned Thanks

## 2014-01-23 NOTE — Telephone Encounter (Signed)
-----   Message from Tammi Sou, [place] sent at 01/23/2014  4:16 PM EST ----- Pt notified of lab results and Dr. Marliss Coots comments. Pt said she uses the [facility] in [place] (please send in K)

## 2014-01-23 NOTE — Telephone Encounter (Signed)
Px sent

## 2014-01-25 NOTE — Assessment & Plan Note (Signed)
Suspect rel to prior alcohol use  Pt will use walker when needed to avoid falls  Will review her last neuro notes as well  Lab today

## 2014-01-25 NOTE — Assessment & Plan Note (Signed)
With thrombocytopenia  She has alcoholism/cirrhosis and also chronic autoimmune dz Check cbc  Disc proper nutrition and enc to take mvi and folic acid

## 2014-01-25 NOTE — Assessment & Plan Note (Signed)
From alcoholism/ also has gallstones Rev last labs and [place]  Sent for lab and notes from ER visit on Fri  Repeat lab today Per pt =no alcohol for almost a mo - feeling much better

## 2014-01-25 NOTE — Assessment & Plan Note (Signed)
Improved with no alcohol for almost a mo  MS improved on less librium as well  hosp records from last inpt stay at [facility] rev in detail  Still some memory issues - I believe to be from prior alcohol use -will need to watch this carefully

## 2014-01-25 NOTE — Assessment & Plan Note (Signed)
Pt has been hosp to detox since last eval  Rev the records we have and sent for her dc summary  Now off HTN meds Only taking librium for sleep at lower dose  Seems to be doing better - though she declines AA meetings - and I enc her to reconsider that  She has cirrhosis at this time and cerebellar ataxia as well as some memory loss - ? If this will be permanent Will rev neuro notes  Also re check hepatic lab today  Commended on quitting

## 2014-01-25 NOTE — Assessment & Plan Note (Signed)
Pt is taking 20 meq of KCL in packet form and cannot afford it  Will check level  Then refill in pill form No cramping or other symptoms

## 2014-01-25 NOTE — Assessment & Plan Note (Signed)
Reviewed [place] from last hospitalization  Also has alcoholic cirrhosis  Disc past jaundice-better now  No abd pain at all  No abd tenderness Disc low fat diet  Lab for LFT today

## 2014-02-26 ENCOUNTER — Telehealth: Payer: Self-pay

## 2014-02-26 NOTE — Telephone Encounter (Signed)
Pt left v/m requesting cb; no further message. Left v/m for pt to cb.

## 2014-02-27 NOTE — Telephone Encounter (Signed)
On 03/07/14 pt is going to start class and will have to take a random blood test for drugs. Last time had drug screen pt tested positive for Marijuana,cocaine and MDMA. Pt attempted to smoke marijuana but could not due to COPD. Pt also request liver panel and general blood testing done as well. Advised pt had BMP,liver function and CBC checked 01/21/14. Pt still request labs repeated to ck on liver and kidney function. Pt request cb.

## 2014-02-27 NOTE — Telephone Encounter (Signed)
Does she need to have all that including the drug screen here? Or is that elsewhere? - let me know and I will order what she needs here [facility] she is feeling well

## 2014-02-27 NOTE — Telephone Encounter (Signed)
Left voicemail requesting pt to call office back 

## 2014-03-02 NOTE — Telephone Encounter (Signed)
Pt left v/m returning call and request cb. 

## 2014-03-02 NOTE — Telephone Encounter (Signed)
Left voicemail requesting pt to call office back 

## 2014-03-02 NOTE — Telephone Encounter (Signed)
Pt left v/m requesting cb at [phone] or [phone].

## 2014-03-03 NOTE — Telephone Encounter (Signed)
Pt said she wants to have a drug screen and labs done here because she has court soon and wants to make sure that she doesn't have anything in her system, pt said she may test positive for Marijuana , but she hasn't done cocaine or MDMA, so she wants to make sure that she doesn't get a positive results and if so she thinks it could be from one of her medications

## 2014-03-03 NOTE — Telephone Encounter (Signed)
Please schedule a lab visit - for assured tox and also for CMET  Thanks  Also let me know how she is feeling

## 2014-03-04 NOTE — Telephone Encounter (Signed)
Left message with family requesting pt to call office back 

## 2014-03-05 ENCOUNTER — Other Ambulatory Visit (INDEPENDENT_AMBULATORY_CARE_PROVIDER_SITE_OTHER): Payer: Managed Care, Other (non HMO)

## 2014-03-05 DIAGNOSIS — R799 Abnormal finding of blood chemistry, unspecified: Secondary | ICD-10-CM

## 2014-03-05 NOTE — Telephone Encounter (Signed)
Pt called and scheduled lab appt

## 2014-03-06 LAB — COMPREHENSIVE METABOLIC PANEL
A/G RATIO: 1.2 (ref 1.1–2.5)
ALBUMIN: 4 g/dL (ref 3.5–5.5)
ALT: 5 IU/L (ref 0–32)
AST: 19 IU/L (ref 0–40)
Alkaline Phosphatase: 71 IU/L (ref 39–117)
BUN / CREAT RATIO: 15 (ref 9–23)
BUN: 14 mg/dL (ref 6–24)
CALCIUM: 9.8 mg/dL (ref 8.7–10.2)
CO2: 27 mmol/L (ref 18–29)
Chloride: 93 mmol/L — ABNORMAL LOW (ref 97–108)
Creatinine, Ser: 0.94 mg/dL (ref 0.57–1.00)
GFR, EST AFRICAN AMERICAN: 77 mL/min/{1.73_m2} (ref >59)
GFR, EST NON AFRICAN AMERICAN: 67 mL/min/{1.73_m2} (ref >59)
Globulin, Total: 3.4 g/dL (ref 1.5–4.5)
Glucose: 82 mg/dL (ref 65–99)
Potassium: 3.4 mmol/L — ABNORMAL LOW (ref 3.5–5.2)
Sodium: 140 mmol/L (ref 134–144)
Total Bilirubin: 1.1 mg/dL (ref 0.0–1.2)
Total Protein: 7.4 g/dL (ref 6.0–8.5)

## 2014-03-19 ENCOUNTER — Encounter: Payer: Self-pay | Admitting: Family Medicine

## 2014-05-29 ENCOUNTER — Telehealth: Payer: Self-pay

## 2014-05-29 DIAGNOSIS — N6320 Unspecified lump in the left breast, unspecified quadrant: Principal | ICD-10-CM

## 2014-05-29 DIAGNOSIS — N6325 Unspecified lump in the left breast, overlapping quadrants: Secondary | ICD-10-CM

## 2014-05-29 NOTE — Telephone Encounter (Signed)
Pt found hard lump at 12 position on left breast on 05/25/14. Pt wants to get appt for mammogram at [facility]; pt does not want to schedule office visit; just wants mammogram.pt request cb.

## 2014-05-31 ENCOUNTER — Emergency Department
Admit: 2014-05-31 | Disposition: A | Discharge status: Other Acute Inpatient Hospital | Payer: Self-pay | Admitting: Emergency Medicine

## 2014-05-31 DIAGNOSIS — N6320 Unspecified lump in the left breast, unspecified quadrant: Principal | ICD-10-CM

## 2014-05-31 DIAGNOSIS — N6325 Unspecified lump in the left breast, overlapping quadrants: Secondary | ICD-10-CM | POA: Insufficient documentation

## 2014-05-31 LAB — COMPREHENSIVE METABOLIC PANEL
ANION GAP: 14 (ref 7–16)
Albumin: 4.1 g/dL
Alkaline Phosphatase: 69 U/L
BILIRUBIN TOTAL: 1.1 mg/dL
BUN: 18 mg/dL
CO2: 32 mmol/L
Calcium, Total: 8.9 mg/dL
Chloride: 89 mmol/L — ABNORMAL LOW
Creatinine: 0.81 mg/dL
EGFR (African American): >60
Glucose: 89 mg/dL
POTASSIUM: 2.6 mmol/L — AB
SGOT(AST): 99 U/L — ABNORMAL HIGH
SGPT (ALT): 27 U/L
Sodium: 135 mmol/L
Total Protein: 8.2 g/dL — ABNORMAL HIGH

## 2014-05-31 LAB — CBC
HCT: 37.7 % (ref 35.0–47.0)
HGB: 12.8 g/dL (ref 12.0–16.0)
MCH: 31.9 pg (ref 26.0–34.0)
MCHC: 33.8 g/dL (ref 32.0–36.0)
MCV: 94 fL (ref 80–100)
PLATELETS: 74 10*3/uL — AB (ref 150–440)
RBC: 4 10*6/uL (ref 3.80–5.20)
RDW: 14 % (ref 11.5–14.5)
WBC: 7.6 10*3/uL (ref 3.6–11.0)

## 2014-05-31 LAB — PROTIME-INR
INR: 1.3
PROTHROMBIN TIME: 15.9 s — AB

## 2014-05-31 LAB — APTT: Activated PTT: 40.2 secs — ABNORMAL HIGH (ref 23.6–35.9)

## 2014-05-31 LAB — LIPASE, BLOOD: Lipase: 69 U/L — ABNORMAL HIGH

## 2014-05-31 NOTE — Telephone Encounter (Signed)
I did the referral -please let her know

## 2014-06-01 NOTE — Telephone Encounter (Signed)
Left message informing patient referral has been placed.

## 2014-06-04 NOTE — Telephone Encounter (Signed)
LVM for pt to return my call.

## 2014-06-04 NOTE — Telephone Encounter (Signed)
Pt is scheduled and aware of appt on 06/22/14 at 11:15am

## 2014-06-08 ENCOUNTER — Ambulatory Visit
Admit: 2014-06-08 | Disposition: A | Discharge status: Home / Self Care | Payer: No Typology Code available for payment source | Attending: Family Medicine | Admitting: Family Medicine

## 2014-06-13 NOTE — Consult Note (Signed)
PATIENT NAME:  Stephanie Raymond, Stephanie Raymond MR#:  [id number] DATE OF BIRTH:  [birth year]  DATE OF CONSULTATION:  12/29/2013  CONSULTING PHYSICIAN:  Gonzella Lex, MD  IDENTIFYING INFORMATION:  This is a 59 year old woman with multiple medical problems who came in to the hospital with altered mental status.   CHIEF COMPLAINT:  "I had an anxiety attack."   REASON FOR CONSULTATION:  Consult for alcohol abuse.   HISTORY OF PRESENT ILLNESS:  Information obtained from the patient and the chart. The patient came in to the hospital with chest pain as well as confusion. She also reported that she had been drinking what was originally called 5 to 6 drinks per day. She has been in the hospital and in the critical care unit with confusion and liver problems ever since. The patient tells me that she drinks 5 to 6 glasses of liquor a day, which when I clarify it, it actually amounts to almost a full fifth bottle of liquor per day. She says that she has been doing that for several years. She has tried to stop in the past, but has never gotten more than a day or 2 into it because of how shaky and sick she feels. She recognizes that it has been a problem for her, but has not gone for any kind of substance abuse treatment except to talk to her primary care doctor. It sounds like on a few occasions her primary care doctor has tried to prescribe outpatient detoxification tapers for her, which were not ultimately useful. The patient denies that she is abusing any other drugs. Mood stays anxious much of the time, a little bit depressed. Sleep is okay, when she is drinking heavily. Appetite is normal. She does feel nervous quite a bit, but had never had what she calls and anxiety attack before what brought her in to the hospital. Denies any suicidality. Denies any hallucinations.   PAST PSYCHIATRIC HISTORY:  The patient somewhat surprisingly tells me that she had a severe drug problem and psychiatric behavior problem during the 1970s.  She describes a long period in her adolescence to early 24s of heavy drug abuse of multiple types. She had psychiatric hospitalizations during that time and had family counseling as well. She has not had any psychiatric hospitalizations in years. Her primary care doctor has been prescribing bupropion for her. It is unclear how helpful that has been.   SOCIAL HISTORY:  The patient lives with her husband. She is retired from [employer]. She describes a good relationship with her husband. She seems to be fairly limited in her social activity.   FAMILY HISTORY:  There is a positive family history of substance abuse.   PAST MEDICAL HISTORY:  The patient has a history of vasculitis and Henoch-Schonlein purpura, history of anemia, gastric reflux, hypothyroidism, and high blood pressure.   SUBSTANCE ABUSE HISTORY:  No known history of seizures or delirium tremens because it sounds like she has never tried to stop drinking long enough. 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   CURRENT MEDICATIONS:  On admission, vitamin D, omeprazole 20 mg a day, magnesium oxide 800 mg twice a day, levothyroxine 50 mcg a day, Lasix 80 mg a day, bupropion SR 150 mg twice a day.   ALLERGIES:  No known drug allergies.   REVIEW OF SYSTEMS:  Currently feeling tired, run down, still a little bit sick to her stomach. She is a little bit  shaky still. Denies suicidality. Denies any auditory or visual hallucinations.   MENTAL STATUS EXAMINATION:  This is a chronically ill-appearing woman interviewed in the critical care unit. She appeared to be forthcoming and pleasantly cooperative. Eye contact was intermittent. Psychomotor activity was a little bit slow. Speech was of normal rate, tone, and volume. Affect was anxious and somewhat constricted. Mood was stated as being nervous. Thoughts rambled a bit at times, but nothing bizarre and overall she stayed on topic. She did  not appear to be delusional. Denied auditory or visual hallucinations. Denied suicidal or homicidal ideation. She could recall 3/3 objects immediately, only 1/3 at 3 minutes. She was alert and oriented x 4.   PHYSICAL EXAMINATION:  Blood pressure this afternoon is still running very high, most recently as high as 117/113. Pulse is running about 98, respirations 20, temperature 97.9.   LABORATORY RESULTS:  I rechecked her ammonia today and it is still high, although not enormously so at 37. Calcium remains low at 8.4. Magnesium is staying persistently low, most recently 1.7. CBC continues to be marked by a platelet count as low as 43, hemoglobin 10.7, white blood cell count 3.2. On admission, drug screen was positive for MDMA, which is probably the Wellbutrin. The alcohol level I am not seeing charted on admission. Cholesterol is good at 80, but HDL low at 38. TSH is normal at 0.9. She has had a bilirubin elevated at 2.8, direct bilirubin elevated at 2 on admission, AST 74, AST 363.   ASSESSMENT:  This is a 59 year old woman who comes in to the hospital and clearly has been drinking a great deal for a long time. She is yellow and jaundiced with blood vessels standing out all over her face, has the look of a person with cirrhosis. She continues to have a high ammonia level. I suspect this patient does have cirrhosis already. I pointed out these things to her, and she insisted that her yellow color was the result of being nervous. She also said that her tremulousness and her high blood pressure was the result of being nervous. She tends to minimize the severity of this alcohol problem. There is no sign of depression and no sign of suicidality.   TREATMENT PLAN:  She is already asking to be discharged. I pointed out to her how abnormal her labs are and how abnormal her blood pressure continues to be. The patient needs further detoxification at this point. I do not think she is having delirium tremens. She needs  to have continued education about the severity of her alcohol use problem. I have discussed with her the idea of going to inpatient rehab, which she is not interested in. She will definitely be referred for outpatient treatment. I will re-evaluate tomorrow and may consider starting her on naltrexone or acamprosate.   DIAGNOSIS, PRINCIPAL AND PRIMARY:  AXIS I:  Alcohol abuse, severe.   SECONDARY DIAGNOSES: AXIS I:  Delirium due to alcohol abuse, resolving.   AXIS II:  Deferred.   AXIS III:  Probable cirrhosis.    ____________________________ Gonzella Lex, MD jtc:nb D: 12/29/2013 22:15:01 ET T: 12/29/2013 23:04:29 ET JOB#: [id number]  cc: Gonzella Lex, MD, <Dictator> Gonzella Lex MD ELECTRONICALLY SIGNED 01/05/2014 17:02

## 2014-06-13 NOTE — Discharge Summary (Signed)
PATIENT NAME:  Stephanie Raymond, Stephanie Raymond MR#:  [id number] DATE OF BIRTH:  11/08/1955  DATE OF ADMISSION:  12/24/2013 DATE OF DISCHARGE:  12/30/2013  ADMITTING DIAGNOSIS: Altered mental status.   DISCHARGE DIAGNOSES:  1.  Acute encephalopathy felt to be due to a combination of alcohol withdrawal and Escherichia coli sepsis, now the patient's mental status is back to baseline.  2.  Delirium tremens due to alcohol withdrawal, now resolved.  3.  Escherichia coli sepsis, with negative urine cultures.  4.  Ultrasound without evidence of gallbladder disease; will be on p.o. antibiotics.  5.  Accelerated hypertension on presentation; improved at discharge.  6.  Wheezing, due to possible bronchospasm; the patient's respiratory status back to normal.  7.  Elevated lipase on presentation, possibly due to mild pancreatitis.  8.  Acute kidney injury resolved with IV fluids.  9.  Elevated liver function tests due to alcoholic liver cirrhosis, based on ultrasound.  10.  Significant electrolyte imbalances due to alcohol abuse.  11.  History of Henoch-Schonlein purpura.  12.  History of vasculitis.  13.  Chronic thrombocytopenia and anemia.  14.  History of depression.  15.  Gastroesophageal reflux disease.  16.  Hypothyroidism.  17.  Chronic seasonal allergies.  18.  Psoriasis.   CONSULTANTS: Gonzella Lex, MD, Mariane Duval, MD.   PERTINENT LABORATORY: Admitting glucose 90, BUN 12, creatinine 0.90, sodium 135, potassium 3.9, chloride 96, CO2 27, calcium is 8.5. Lipase was 519. LFTs: Bilirubin total 2.8, alkaline phosphatase 122, AST 363, ALT 44. On 12/27/2013, AST was 54, ALT 44. Urine drug screen positive for MDMA. WBC 6.8, hemoglobin 12, and platelet count was 60,000. Blood cultures x 2 showed Escherichia coli in the blood. Urinalysis was negative. Right upper quadrant ultrasound was negative for any gallbladder disease. Findings were consistent with possible liver cirrhosis.   HOSPITAL COURSE: The  patient is a 59 year old white female with a significant history of alcohol abuse, who presented to the ED with complaint of chest pain and abdominal pain. While the patient was being evaluated, she started having agitation with blood pressure increasing, so she thought to have alcohol withdrawal. The patient was admitted and she had severe symptoms. Therefore, she had to be in the ICU. The patient was on a Precedex drip for a few days and then subsequently transferred out of the ICU, once her delirium tremens resolved. She was also thought to have Escherichia coli sepsis; further workup for that was negative. She was kept on IV antibiotics with improvement in her mental status. The patient no longer exhibiting any signs of DTs. She was seen by psychiatry, and was given resources for outpatient assistance for alcohol abstinence. She also has a prescription for Antabuse, per her primary care provider, which the psychiatrist recommended to use.   At this time she is stable for discharge.   DISCHARGE MEDICATIONS: Levothyroxine 50 mcg daily, Lasix 80 daily, magnesium oxide 800 b.i.d. Vitamin D3 400 mg 1 tablet p.o. daily, bupropion 150 mg 1 tablet p.o. b.i.d., omeprazole 20 daily, metoprolol succinate 100 daily, hydralazine 50 mg 1 tablet p.o. t.i.d., Cipro 500 mg 1 tablet p.o. q. 12 hours to finish a course of 9 days.   DIET: Low sodium.   ACTIVITY: As tolerated.   FOLLOWUP: With primary MD in 1-2 weeks. The patient is recommended to stop drinking. Antabuse use as prescribed by her primary MD. The patient was given an AA referral.    TIME SPENT: 35 minutes   ____________________________ Chana Bode  Jearld Lesch, MD shp:MT D: 12/31/2013 13:09:22 ET T: 12/31/2013 13:34:17 ET JOB#: [id number]  cc: Kaziyah Parkison H. Posey Pronto, MD, <Dictator> Alric Seton MD ELECTRONICALLY SIGNED 01/07/2014 19:31

## 2014-06-13 NOTE — Consult Note (Signed)
Brief Consult Note: Diagnosis: alcohol withdrawl, alcohol abuse.   Patient was seen by consultant.   Consult note dictated.   Orders entered.   Comments: PSychiatry: PAtient seen and chart reviewed. Patient with alcohol abuse and what looks like cirrocis. Stillo a little confused though not as much as before. Somewhat minimizing the severity of condition. Education done. Recheck ammonia level. Continue current treatment. Will reevaluate tomorrow. May be a candidate for naltrexone or acamprosate.  Electronic Signatures: Gonzella Lex (MD)  (Signed [phone] 16:44)  Authored: Brief Consult Note   Last Updated: 09-Nov-15 16:44 by Gonzella Lex (MD)

## 2014-06-13 NOTE — H&P (Signed)
PATIENT NAME:  Stephanie Raymond, Stephanie Raymond MR#:  [id number] DATE OF BIRTH:  1955/10/17  DATE OF ADMISSION:  12/24/2013  PRIMARY CARE PROVIDER:  Dion Body.    EMERGENCY DEPARTMENT REFERRING DOCTOR: Dr. Edd Fabian.   CHIEF COMPLAINT: Altered mental status, tachycardia.   HISTORY OF PRESENT ILLNESS: The patient is a 59 year old white female with history of Henoch-Schonlein purpura, history of vasculitis, history of anemia, depression, GERD, hypothyroidism, hypertension, who drinks 5-6 drinks per day. Was brought into earlier in the morning and has been in the ED more than 8 hours with complaint of initially came in with chest pain in the midsternum region. Sharp in nature. The patient was getting evaluated, she initially had a workup including cardiac enzymes, EKG which showed no significant abnormality. The patient also underwent a CT per PE protocol which was negative. While in the ED the patient started demonstrating signs of withdrawal, with a heart rate going up around 7:30 at 135.  She has received 4 doses of Ativan and her heart rate continues to be elevated. The patient opens her eyes, but is lethargic and is not able to give me any history. Her husband is at the bedside. He reports that she has not had any chest pain previous to yesterday. Has not had any shortness of breath. She also was having nausea and vomiting. He feels that her last drink was yesterday.   PAST MEDICAL HISTORY:  1.  History of Henoch-Schonlein purpura.   2.  History of vasculitis.  3.  History of anemia.  4.  History of depression.  5.  GERD.  6.  Hypothyroidism.  7.  Chronic seasonal allergies.  8.  Hypertension.  9.  Psoriasis.   PAST SURGICAL HISTORY: Status post partial hysterectomy.   ALLERGIES: PREDNISONE.   CURRENT HOME MEDICATIONS: Vitamin D3 4000 international units daily, omeprazole 20 daily, magnesium oxide 800 b.i.d., levothyroxine 50 mcg daily, Lasix 80 daily, bupropion SR 150 one tab p.o. b.i.d.   SOCIAL  HISTORY: Smokes a few cigarettes a day. Drinks 5-6 drinks on a daily basis. No drug use.   FAMILY HISTORY: Positive for heart disease in a brother.   REVIEW OF SYSTEMS: Unobtainable due to the patient being lethargic and unable to give me any history.   PHYSICAL EXAMINATION:  VITAL SIGNS: Temperature 97.4, pulse 140, respirations 18, blood pressure 166/101, O2 of 99% on 5 liters of oxygen.  GENERAL: The patient is a critically ill-appearing female. Opens her eyes but very lethargic.  HEENT: Head atraumatic, normocephalic. Pupils equally round, reactive to light and accommodation. There is no conjunctival pallor. No sclerae icterus. Nasal exam shows no drainage or ulceration. Oropharynx, the patient is not opening her mouth for me to examine. External ear exam shows no erythema or drainage. Nasal exam shows no drainage or ulceration.  NECK: Supple without any JVD.  No thyromegaly.  CARDIOVASCULAR: Tachycardic. No murmurs, rubs, clicks, or gallops.  LUNGS: Clear to auscultation bilaterally without any rales, rhonchi, wheezing.  ABDOMEN: Soft, nontender, nondistended. Positive bowel sounds x 4. No hepatosplenomegaly.   EXTREMITIES: She has trace edema.  SKIN: No visualized rash.  LYMPH NODES: None palpable.  MUSCULOSKELETAL: There is no erythema or swelling.  PSYCHIATRIC: The patient is currently very lethargic, is not anxious or depressed.  NEUROLOGIC: Was moving all 4 extremities earlier. Currently lethargic to do any meaningful neurologic examination.    LABORATORY EVALUATION: EKG showed sinus tachycardia, heart rate 140s. No ST-T wave changes. Glucose 90, BUN 12, creatinine 0.90, sodium 135, potassium  3.9, chloride 96, CO2 of 27, lipase 519. Alcohol level 109. Troponin less than 0.02 x 2. WBC 6.8, hemoglobin 12, platelet count 60,000. D-dimer 1755. CT scan of the chest per PE protocol showed no evidence of pulmonary embolism. Chest x-ray shows no active cardiopulmonary disease. EKG, normal  sinus rhythm with nonspecific ST-T wave changes.   ASSESSMENT AND PLAN: The patient is a 59 year old white female brought in early morning by husband with chest pain. Now noted to have alcohol withdrawal.   1.  Alcohol withdrawal, appears to be severe.  We will place her on DT protocol. Monitor her mental status closely.  The patient is high risk of complications, I am going to watch her in the stepdown unit.  2.  Chest pain, CT negative for PE. Serial cardiac enzymes. The patient will need a stress test once stable.  3.  Sinus tachycardia possibly due to DTs. We will give her IV metoprolol schedule.  4.  Accelerated hypertension. She will be on IV metoprolol as well as p.r.n. hydralazine.  5.  History of Henoch-Schonlein purpura with chronic thrombocytopenia. Monitor her platelet count.  6.  Gastroesophageal reflux disease.  The patient will be continued on PPIs.  7.  Hypothyroidism. We will check a TSH. Continue her supplements.  8.  Miscellaneous. She will be on SCDs for DVT.    TIME SPENT ON THIS PATIENT'S HISTORY AND PHYSICAL: 60 minutes of critical care time.   ____________________________ Lafonda Mosses Posey Pronto, MD shp:bu D: 12/24/2013 13:14:38 ET T: 12/24/2013 13:59:00 ET JOB#: [id number]  cc: Sashia Campas H. Posey Pronto, MD, <Dictator> Alric Seton MD ELECTRONICALLY SIGNED 01/07/2014 19:36

## 2014-06-14 NOTE — Consult Note (Signed)
Chief Complaint:   Subjective/Chief Complaint Patient seen and examined, pease see full Gi consult, further recs to follow.   Electronic Signatures: Loistine Simas (MD)  (Signed 06-Apr-13 02:10)  Authored: Chief Complaint   Last Updated: 06-Apr-13 02:10 by Loistine Simas (MD)

## 2014-06-14 NOTE — Consult Note (Signed)
History of Present Illness:   Reason for Consult Thrombocytopenia.    HPI   Patient is a 59 year old female was admitted to the emergency room with intractable nausea and vomiting along with abdominal pain.  She was noted to be hypotensive with a leukocytosis and was admitted to the ICU for blood pressure support.  CT scan noted colitis and patient also had heme positive stools.  Her platelets were within normal limits upon admission, not decreased.  She is complaining of a several week rash on her feet and hands but she relates to prednisone.  Since discontinuing prednisone, her rash has not improved.  She offers no further specific complaints today.  PFSH:   Additional Past Medical and Surgical History Past medical history: GERD, hypothyroidism, depression, hypertension, psoriasis.  Past surgical history: Negative.  Social history: Positive tobacco, patient denies alcohol.  Family history: Hypertension.   Review of Systems:   Performance Status (ECOG) 0    Review of Systems   As per HPI. Otherwise, 10 point system review was negative.   NURSING NOTES: **Vital Signs.:   06-Apr-13 12:44    Vital Signs Type: Upon Transfer    Temperature Temperature (F): 98.7    Celsius: 37    Temperature Source: oral    Pulse Pulse: 99    Pulse source: per Dinamap    Respirations Respirations: 22    Systolic BP Systolic BP: 98    Diastolic BP (mmHg) Diastolic BP (mmHg): 52    Mean BP: 67    BP Source: manual    Pulse Ox % Pulse Ox %: 100    Pulse Ox Activity Level: At rest    Oxygen Delivery: Room Air/ 21 %   Physical Exam:   Physical Exam General: Well-developed, well-nourished, no acute distress. Eyes: Pink conjunctiva, anicteric sclera. HEENT: Normocephalic, moist mucous membranes, clear oropharnyx. Lungs: Clear to auscultation bilaterally. Heart: Regular rate and rhythm. No rubs, murmurs, or gallops. Abdomen: Soft, nontender, nondistended. No organomegaly noted,  normoactive bowel sounds. Musculoskeletal: No edema, cyanosis, or clubbing. Neuro: Alert, answering all questions appropriately. Cranial nerves grossly intact. Skin: Rash noted on the, ankles, and upper extremities. Psych: Normal affect. Lymphatics: No cervical, calvicular, axillary or inguinal LAD.    Prednisolone: Rash    Wellbutrin:  orally , Active, 0, None   triamterene:  orally , Active, 0, None   Synthroid:  orally , Active, 0, None   Avapro: Active, 0, None   omeprazole 20 mg oral delayed release tablet: 1 tab(s) orally once a day, Active, 0, None  Routine Hem:  06-Apr-13 06:29    WBC (CBC) 9.9   RBC (CBC) 2.53   Hemoglobin (CBC) 9.7   Hematocrit (CBC) 28.7   Platelet Count (CBC) 128   MCV 113   MCH 38.3   MCHC 33.8   RDW 15.7   Neutrophil % 83.3   Lymphocyte % 9.7   Monocyte % 5.8   Eosinophil % 1.0   Basophil % 0.2   Neutrophil # 8.2   Lymphocyte # 1.0   Monocyte # 0.6   Eosinophil # 0.1   Basophil # 0.0  Routine Chem:  06-Apr-13 06:29    Lipase 1085   Glucose, Serum 98   BUN 49   Creatinine (comp) 1.29   Sodium, Serum 136   Potassium, Serum 3.7   Chloride, Serum 109   CO2, Serum 15   Calcium (Total), Serum 5.7  Hepatic:  06-Apr-13 06:29    Bilirubin, Total 0.9  Alkaline Phosphatase 93   SGPT (ALT) 11   SGOT (AST) 26   Total Protein, Serum 4.7   Albumin, Serum 1.8  Routine Chem:  06-Apr-13 06:29    Osmolality (calc) 285   eGFR (African American) 55   eGFR (Non-African American) 46   Anion Gap 12   Magnesium, Serum 1.6  Thyroid:  06-Apr-13 06:29    Thyroxine, Free 1.60   Assessment and Plan:  Impression:   Thrombocytopenia.  Plan:   1.  Thrombocytopenia: Patient platelet count was normal upon admission, initially decreased, and now is improving.  This is likely related to her underlying colitis and infection.  No intervention is needed at this time.  Continue to monitor daily CBC. Rash: Unclear etiology, but it is highly unlikely  that she had a petechial rash given a normal platelet count.  Unclear if it is related to prednisone, Monitor. consult, call with questions.   Electronic Signatures: Delight Hoh (MD)  (Signed 06-Apr-13 13:15)  Authored: HISTORY OF PRESENT ILLNESS, PFSH, ROS, NURSING NOTES, PE, ALLERGIES, HOME MEDICATIONS, LABS, ASSESSMENT AND PLAN   Last Updated: 06-Apr-13 13:15 by Delight Hoh (MD)

## 2014-06-14 NOTE — Consult Note (Signed)
Brief Consult Note: Diagnosis: Colitis.   Patient was seen by consultant.   Consult note dictated.   Comments: Appreciate consult for 59 y/o caucasian woman admitted with ARF/SIRS/colitis, with history of hypothyroidism and psoriasis for diarrhea/colitis. No history of prior luminal evaluation. States that she started wtih nausea and diarrhea a few days ago on the way to [facility]- has had several brown loose stools of varying sizes daily  since then. Patient denies history of fever and abdominal pain, emesis. No known triggers. States she did eat an [place] sub from [place], but denies other dietary changes prior to this episode of illness. States she uses well water, has cats and dogs, eats at restaraunts rarely. Denies travel and sick exposures. Does state she was treated at urgent care a few days ago for carpal tunnel and was treated with prednisone, which she stopped taking due to outbreak of a red, freckly rash to hands and feet. Does have a small lingual ulceration at present, but denies history of oral ulcers. Never took accutane. Denies vitamin and iron deficiencies. Low grade temp and mild hypotension noted. Stool studies pending. Has had renal consult and is receiving IVF/Zosyn Impression: Diarrhea- concern for infectious process. Will await stool study results. Would continue with antibiotics and symptomatic treatment as you are. Further recommendations to follow.  Electronic Signatures: Stephens November H (NP)  (Signed 05-Apr-13 16:51)  Authored: Brief Consult Note   Last Updated: 05-Apr-13 16:51 by Theodore Demark (NP)

## 2014-06-14 NOTE — Consult Note (Signed)
Chief Complaint:   Subjective/Chief Complaint patient feeling a little better, still with diarrhea/incontinence.  denies nausea or abdominal pain.   VITAL SIGNS/ANCILLARY NOTES: **Vital Signs.:   07-Apr-13 14:16   Temperature Temperature (F) 99.7   Celsius 37.6   Temperature Source oral   Pulse Pulse 94   Respirations Respirations 18   Systolic BP Systolic BP 759   Diastolic BP (mmHg) Diastolic BP (mmHg) 59   Mean BP 72   Pulse Ox % Pulse Ox % 98   Pulse Ox Activity Level  At rest   Oxygen Delivery Room Air/ 21 %  *Intake and Output.:   07-Apr-13 15:16   Stool  medium formed bowel movement    16:21   Stool  green  liquid   Brief Assessment:   Cardiac Regular    Respiratory clear BS    Gastrointestinal details normal Soft  Nontender  Nondistended  No masses palpable  Bowel sounds normal   Routine Hem:  07-Apr-13 05:09    WBC (CBC) 10.0   RBC (CBC) 2.65   Hemoglobin (CBC) 10.1   Hematocrit (CBC) 29.9   Platelet Count (CBC) 126   MCV 113   MCH 38.1   MCHC 33.7   RDW 16.1   Neutrophil % 82.4   Lymphocyte % 9.5   Monocyte % 7.0   Eosinophil % 1.0   Basophil % 0.1   Neutrophil # 8.3   Lymphocyte # 1.0   Monocyte # 0.7   Eosinophil # 0.1   Basophil # 0.0  Routine Chem:  07-Apr-13 05:09    Lipase 875   Glucose, Serum 111   BUN 30   Creatinine (comp) 1.06   Sodium, Serum 134   Potassium, Serum 3.4   Chloride, Serum 102   CO2, Serum 20   Calcium (Total), Serum 6.1   Osmolality (calc) 275   eGFR (African American) >60   eGFR (Non-African American) 57   Anion Gap 12  Routine Coag:  07-Apr-13 11:57    Prothrombin 18.9   INR 1.5   Activated PTT (APTT) 40.7   Fibrin Degradation Products > 40  Routine Chem:  [id number]-[id number] 59:93    Folic Acid, Serum 2.3  Routine Coag:  07-Apr-13 15:47    Fibrinogen 369   Assessment/Plan:  Assessment/Plan:   Assessment 1) diarrheal illness.  slowly improving.  patietn with renal insuff on admission, improved.  stool  studies negative.    Plan 1) multiple labs pending in regard to petechial rash/possible vasculitis 2) with improvement of symptoms and labs, recommend continuing current, repeat ct of abd in 2-3 days.  Following, further recs to follow.  discussed with DR Verl Blalock.   Electronic Signatures: Loistine Simas (MD)  (Signed 07-Apr-13 21:07)  Authored: Chief Complaint, VITAL SIGNS/ANCILLARY NOTES, Brief Assessment, Lab Results, Assessment/Plan   Last Updated: 07-Apr-13 21:07 by Loistine Simas (MD)

## 2014-06-14 NOTE — Consult Note (Signed)
Chief Complaint:   Subjective/Chief Complaint continues to feel ill, possible mild improvement.  no diarrhea today.  denies nausea or abdominal pain.   VITAL SIGNS/ANCILLARY NOTES: **Vital Signs.:   09-Apr-13 14:40   Vital Signs Type Routine   Temperature Temperature (F) 99.1   Celsius 37.2   Temperature Source oral   Pulse Pulse 117   Pulse source per Dinamap   Respirations Respirations 18   Systolic BP Systolic BP 97   Diastolic BP (mmHg) Diastolic BP (mmHg) 58   Mean BP 71   BP Source Dinamap   Pulse Ox % Pulse Ox % 97   Pulse Ox Activity Level  At rest   Oxygen Delivery Room Air/ 21 %   Brief Assessment:   Cardiac Regular    Respiratory clear BS    Gastrointestinal details normal moderate distension, bs positive, non-tender   Routine Hem:  09-Apr-13 04:30    WBC (CBC) 15.3   RBC (CBC) 2.62   Hemoglobin (CBC) 9.9   Hematocrit (CBC) 29.8   Platelet Count (CBC) 105   MCV 114   MCH 38.0   MCHC 33.3   RDW 16.4   Neutrophil % 82.0   Lymphocyte % 11.1   Monocyte % 5.3   Eosinophil % 1.3   Basophil % 0.3   Neutrophil # 12.5   Lymphocyte # 1.7   Monocyte # 0.8   Eosinophil # 0.2   Basophil # 0.0  Routine Chem:  09-Apr-13 04:30    Lipase 400   Glucose, Serum 93   BUN 24   Creatinine (comp) 1.14   Sodium, Serum 136   Potassium, Serum 3.8   Chloride, Serum 102   CO2, Serum 24   Calcium (Total), Serum 6.7   Osmolality (calc) 276   eGFR (African American) >60   eGFR (Non-African American) 53   Anion Gap 10   Magnesium, Serum 1.1   Radiology Results: XRay:    09-Apr-13 09:26, Abdomen Flat and Erect   Abdomen Flat and Erect    REASON FOR EXAM:    abdominal distention  COMMENTS:       PROCEDURE: DXR - DXR ABDOMEN 2 V FLAT AND ERECT  - May 30 2011  9:26AM     RESULT: There is dilatation of multiple loops of large and small bowel.   The degree of bowel dilatation appears greater than that observed on the   topogram for the abdominal CT of 05/25/2011. No  abnormal intraabdominal   calcifications are seen. No subdiaphragmatic free air is noted in the   erect view. There are noted degenerative changes of the mid and lower   lumbar spine.    IMPRESSION:   1. There is nonspecific dilatation of multiple loops of large and small   bowel with the bowel dilatation appearing somewhat more prominent than     was evident on the exam of 05/25/2011.  2. No subdiaphragmatic free air is seen.  3. There are degenerative changes in the mid and lower lumbar spine.      Thank you for the opportunity to contribute to the care of your patient.           Verified By: Dionne Ano WALL, M.D., MD   Assessment/Plan:  Assessment/Plan:   Assessment 1) enteritis. no diarrhea today.  no abd pain.  etiology likely infective versus immune related. 2) protien loss, renal and enteric 3) hypoalbuminemia 4) rash/purpura-etiology uncertain 5) SIRS-UDE    Plan 1) hepatitis C serology and  cryoglobulins pending. 2) case discussed with Dr Verl Blalock.  Will give a low dose of iv albumin to help improve peripheral edema/fluid balance. Will arrange for repeat CT of abd and pelvis tomorrow.  Possible luminal evaluation depending on clinical situation.   Electronic Signatures: Loistine Simas (MD)  (Signed 09-Apr-13 21:31)  Authored: Chief Complaint, VITAL SIGNS/ANCILLARY NOTES, Brief Assessment, Lab Results, Radiology Results, Assessment/Plan   Last Updated: 09-Apr-13 21:31 by Loistine Simas (MD)

## 2014-06-14 NOTE — Consult Note (Signed)
Chief Complaint:   Subjective/Chief Complaint feeling perhaps a bit better today.  denies nausea or abdominal pain.  Seen by derm and skin biopsy done yesterday.  patietn relates small bm, uncertain of details, not recorded. no diarrhea.   VITAL SIGNS/ANCILLARY NOTES: **Vital Signs.:   11-Apr-13 17:53   Vital Signs Type Q 4hr   Temperature Temperature (F) 97.5   Celsius 36.3   Temperature Source oral   Pulse Pulse 112   Pulse source per Dinamap   Respirations Respirations 18   Systolic BP Systolic BP 97   Diastolic BP (mmHg) Diastolic BP (mmHg) 67   Mean BP 77   BP Source Dinamap   Pulse Ox % Pulse Ox % 98   Pulse Ox Activity Level  At rest   Oxygen Delivery Room Air/ 21 %   Brief Assessment:   Cardiac Regular  tachycardia    Respiratory clear BS    Gastrointestinal details normal Soft  Nontender  No masses palpable  Bowel sounds normal  some distension but noted to be less than yesterday.   Routine Hem:  09-Apr-13 04:30    WBC (CBC) 15.3  10-Apr-13 04:59    WBC (CBC) 17.6  11-Apr-13 04:19    WBC (CBC) 24.5   RBC (CBC) 2.71   Hemoglobin (CBC) 10.0   Hematocrit (CBC) 30.7   Platelet Count (CBC) 119   MCV 114   MCH 37.1   MCHC 32.7   RDW 17.3   Neutrophil % 86.0   Lymphocyte % 7.7   Monocyte % 5.0   Eosinophil % 1.1   Basophil % 0.2   Neutrophil # 21.0   Lymphocyte # 1.9   Monocyte # 1.2   Eosinophil # 0.3   Basophil # 0.1  Routine Chem:  11-Apr-13 04:19    Glucose, Serum 108   BUN 35   Creatinine (comp) 2.14   Sodium, Serum 131   Potassium, Serum 4.5   Chloride, Serum 96   CO2, Serum 24   Calcium (Total), Serum 8.7  Hepatic:  11-Apr-13 04:19    Bilirubin, Total 0.8   Alkaline Phosphatase 96   SGPT (ALT) 8   SGOT (AST) 22   Total Protein, Serum 4.8   Albumin, Serum 1.8  Routine Chem:  11-Apr-13 04:19    Osmolality (calc) 271   eGFR (African American) 31   eGFR (Non-African American) 25   Anion Gap 11   Magnesium, Serum 1.6   Radiology  Results: XRay:    11-Apr-13 10:10, Abdomen Flat and Erect   Abdomen Flat and Erect    REASON FOR EXAM:    abdominal distention  COMMENTS:       PROCEDURE: DXR - DXR ABDOMEN 2 V FLAT AND ERECT  - Jun 01 2011 10:10AM     RESULT: Flat and erect views of the abdomen were obtained and are   compared to a prior exam of 05/30/2011. No subdiaphragmatic free air is   seen. No significantly dilated loops of bowel are identified. The small   bowel dilatation evident on the prior exam is no longer definitely seen.   No abnormal intraabdominal calcifications are noted. The osseous   structures are normal in appearance.    IMPRESSION:   1. No acute changes are identified.    Thank you for the opportunity to contribute to the care of your patient.           Verified By: Dionne Ano WALL, M.D., MD  Cardiology:    11-Apr-13  09:39, Echo Doppler   Echo Doppler    Interpretation Summary    The study was technically difficult.    Left ventricular systolic function is normal. Ejection Fraction =   >55%. The left atrial size is normal. Right atrial size is normal.   The study was technically difficult.    Procedure:    A two-dimensional transthoracic echocardiogram with color flow and   Doppler was performed.    The study was technically difficult with many images being suboptimal   in quality.    Left Ventricle    Left ventricular systolic function is normal.    Ejection Fraction = >55%.    Right Ventricle    The right ventricle is grossly normal size.    Atria    The left atrial size is normal.    Right atrial size is normal.    Mitral Valve    The mitral valve is not well visualized.    Tricuspid Valve    The tricuspid valve is not well visualized.    Aortic Valve    The aortic valve is not well visualized.    MMode 2D Measurements and Calculations    IVSd: 1.2 cm    LVIDd: 3.4 cm    LVIDs: 2.2 cm    LVPWd: 1.2 cm    FS: 36 %    EF(Teich): 66 %   Ao root diam: 3.1 cm    LA  dimension: 3.4 cm    LVOT diam: 2.0 cm    Doppler Measurements and Calculations    MV E point: 52 cm/sec    MV A point: 84 cm/sec    MV E/A: 0.62     MV dec time: 0.12 sec    Ao V2 max: 133 cm/sec    Ao max PG: 7.0 mmHg  AVA(V,D): 2.6 cm2    LV max PG: 5.0 mmHg    LV V1 max: 110 cm/sec    TR Max vel: 219 cm/sec    TR Max PG: 19 mmHg    RVSP: 24 mmHg    RAP systole: 5.0 mmHg    Reading Physician: Bartholome Bill   Sonographer: Sherrie Sport  Interpreting Physician:  Bartholome Bill,  electronically signed on   06-01-2011 13:23:54  Requesting Physician: Bartholome Bill   Assessment/Plan:  Assessment/Plan:   Assessment 1) acute diarrheal illness improved.  abd xray today improve  compared to previous.   2) rash/purpura/petechiae-awaiting skin biopsy result.  3) increasing creatinine-prerenal?, appreciate renal assistance.    Plan 1) continue current.  CT tomorrow of the abd and pelvis, likely without contrast, comparison to previous.   Electronic Signatures: Loistine Simas (MD)  (Signed 11-Apr-13 21:01)  Authored: Chief Complaint, VITAL SIGNS/ANCILLARY NOTES, Brief Assessment, Lab Results, Radiology Results, Assessment/Plan   Last Updated: 11-Apr-13 21:01 by Loistine Simas (MD)

## 2014-06-14 NOTE — H&P (Signed)
PATIENT NAME:  Stephanie Raymond, Stephanie Raymond MR#:  [id number] DATE OF BIRTH:  1955/10/19  DATE OF ADMISSION:  05/25/2011  REFERRING PHYSICIAN: Dr. Renee Ramus FAMILY PHYSICIAN: Dr. Glori Bickers  REASON FOR ADMISSION: SIRS with colitis and acute renal failure.   HISTORY OF PRESENT ILLNESS: The patient is a 59 year old female with a history of hypothyroidism, depression, benign hypertension, and psoriasis who presents to the Emergency Room with a 4 to 5 day history of mild abdominal pain associated with intractable nausea, vomiting, and diarrhea. In the Emergency Room, the patient was noted to be hypotensive with a white count of greater than 19,000. Urinalysis and chest x-ray were nondiagnostic. CT of the abdomen revealed colitis. She was given 3 liters of IV fluids in the Emergency Room but remained hypertensive. She is now admitted to the Intensive Care Unit for further evaluation.   PAST MEDICAL HISTORY:  1. Gastroesophageal reflux disease.  2. Hypothyroidism.  3. Depression. 4. Environmental allergies.  5. Benign hypertension.  6. Psoriasis.   MEDICATIONS:  1. Dyazide 1 p.o. daily.  2. Avapro 150 mg p.o. daily.  3. Omeprazole 20 mg p.o. daily.  4. Wellbutrin SR 150 mg p.o. b.i.d.  5. Synthroid 0.05 mg p.o. daily.   ALLERGIES: Prednisolone.   SOCIAL HISTORY: Patient does smoke. No history of alcohol abuse.   FAMILY HISTORY: Positive for hypertension.   REVIEW OF SYSTEMS: CONSTITUTIONAL: Patient has had fever but denies change in weight. EYES: No blurred or double vision. No glaucoma. ENT: No tinnitus or hearing loss. No nasal discharge or bleeding. No difficulty swallowing. RESPIRATORY: No cough or wheezing. Denies hemoptysis. No painful respiration. CARDIOVASCULAR: No chest pain or orthopnea. Denies palpitations or syncope. GASTROINTESTINAL: Has had nausea, vomiting, and diarrhea and abdominal pain as per history of present illness. No hematemesis. GENITOURINARY: No dysuria or hematuria. No incontinence.  ENDOCRINE: No polyuria or polydipsia. No heat or cold intolerance. HEMATOLOGIC: Patient denies anemia, easy bruising, or bleeding. LYMPHATIC: No swollen glands. MUSCULOSKELETAL: Patient denies pain in her neck, back, shoulders, knees, or hips. No gout. NEUROLOGIC: No numbness although she has generalized weakness. Denies migraines, stroke or seizures. PSYCH: Patient denies anxiety, insomnia, or depression.   PHYSICAL EXAMINATION:  GENERAL: Patient is acutely ill appearing, somewhat lethargic.   VITAL SIGNS: Vital signs remarkable for a blood pressure of 84/60 with a heart rate of 97 and a respiratory rate of 16. She is afebrile.   HEENT: Normocephalic, atraumatic. Pupils are equal, round, reactive to light and accommodation. Extraocular movements are intact. Sclerae not icteric. Conjunctivae are clear. Oropharynx is dry but clear.   NECK: Supple without jugular venous distention or bruits. No adenopathy or thyromegaly is noted.   LUNGS: Clear to auscultation and percussion without wheezes, rales, or rhonchi. No dullness. Respiratory effort is normal.   CARDIAC: Regular rate and rhythm with normal S1 and S2. No significant rubs, murmurs, or gallops. PMI is nondisplaced. Chest wall is nontender.   ABDOMEN: Soft but diffusely tender. No guarding or rebound. Hypoactive bowel sounds. No organomegaly or masses were appreciated. No hernias or bruits were noted.   EXTREMITIES: Without clubbing, cyanosis, edema. Pulses were 2+ bilaterally.   SKIN: Warm and dry without rash or lesions.   NEUROLOGIC: Exam revealed cranial nerves II through XII grossly intact. Deep tendon reflexes were symmetric. Motor and sensory exam is nonfocal.   PSYCH: Exam revealed a patient who was alert and oriented to person, place, and time. She was cooperative and used good judgment.   LABORATORY, DIAGNOSTIC AND RADIOLOGICAL  DATA: EKG revealed sinus tachycardia with no acute ischemic changes. Chest x-ray was essentially  unremarkable. Urinalysis was negative except for 1+ blood. White count was 19.3 with a hemoglobin of 11.8. Lipase was 619. Glucose was 82 with a BUN of 81 and a creatinine of 3.79 with a GFR of 13. Her sodium was 128 with a potassium of 3.3 and a calcium of 6.4. Magnesium was 0.7. CT of the abdomen revealed thickening of the colon and small bowel suggestive of enterocolitis.   ASSESSMENT:  1. Presumed SIRS with hypotension and leukocytosis.  2. Enterocolitis.  3. Acute renal failure.  4. Dehydration.  5. Hypokalemia.  6. Hyponatremia.  7. Hypomagnesemia.  8. Anemia.   PLAN: Patient will be admitted to the Intensive Care Unit with IV fluids. Will add pressors if her blood pressure does not respond to the IV fluids that she is getting. Will supplement potassium and magnesium in her IV. Will begin empiric IV antibiotics. Blood and urine cultures have been sent off. Will consult nephrology in regards to her acute renal failure and will consult GI in regards to her enterocolitis. Will hold her blood pressure medications for now. Continue Synthroid. Continue clear liquid diet. Follow up routine labs in the morning. Further treatment and evaluation will depend upon the patient's progress.   TOTAL TIME SPENT ON THIS PATIENT: 50 minutes.   ____________________________ Leonie Douglas Doy Hutching, MD jds:cms D: 05/25/2011 21:34:58 ET T: 05/26/2011 06:34:03 ET  JOB#: [id number] cc: Leonie Douglas. Doy Hutching, MD, <Dictator> Marne A. Glori Bickers, MD Even Budlong Lennice Sites MD ELECTRONICALLY SIGNED 05/26/2011 8:09

## 2014-06-14 NOTE — Consult Note (Signed)
Chief Complaint:   Subjective/Chief Complaint some increased abdominal distension, continues ill appearring though denies n or abdominal pain.   VITAL SIGNS/ANCILLARY NOTES: **Vital Signs.:   08-Apr-13 18:03   Vital Signs Type Q 4hr   Temperature Temperature (F) 99.6   Celsius 37.5   Temperature Source oral   Pulse Pulse 121   Pulse source per Dinamap   Respirations Respirations 20   Systolic BP Systolic BP 93   Diastolic BP (mmHg) Diastolic BP (mmHg) 61   Mean BP 71   BP Source Dinamap   Pulse Ox % Pulse Ox % 96   Pulse Ox Activity Level  At rest   Oxygen Delivery Room Air/ 21 %   Brief Assessment:   Cardiac Regular    Respiratory clear BS    Gastrointestinal details normal distended, bs positive, non-tender     Routine Hem:  08-Apr-13 04:30    WBC (CBC) 9.5   RBC (CBC) 2.38   Hemoglobin (CBC) 8.9   Hematocrit (CBC) 26.8   Platelet Count (CBC) 106   MCV 113   MCH 37.5   MCHC 33.2   RDW 16.3   Neutrophil % 76.9   Lymphocyte % 13.6   Monocyte % 7.4   Eosinophil % 1.7   Basophil % 0.4   Neutrophil # 7.3   Lymphocyte # 1.3   Monocyte # 0.7   Eosinophil # 0.2   Basophil # 0.0  Routine Chem:  08-Apr-13 04:30    Glucose, Serum 119   BUN 28   Creatinine (comp) 1.18   Sodium, Serum 137   Potassium, Serum 4.1   Chloride, Serum 103   CO2, Serum 24   Calcium (Total), Serum 6.3  Hepatic:  08-Apr-13 04:30    Bilirubin, Total 0.6   Alkaline Phosphatase 81   SGPT (ALT) 8   SGOT (AST) 21   Total Protein, Serum 4.5   Albumin, Serum 1.7  Routine Chem:  08-Apr-13 04:30    Osmolality (calc) 280   eGFR (African American) >60   eGFR (Non-African American) 51   Anion Gap 10  Routine Hem:  08-Apr-13 10:23    Erythrocyte Sed Rate 32  Routine Coag:  08-Apr-13 14:36    Col/EPI 85   Assessment/Plan:  Assessment/Plan:   Assessment 1) atypical presentation of enteritis, noted by CT.  continues with diarrhea, no abdominal pain, positive nausea.  2)  thrombocytopenia, atypical rash/purpura 3) history of psoriatic arthritis    Plan 1) recommend luminal evaluation via egd and colonoscopy as clinically feasible, will try to place on schedule of  wed or thurs.  2) 2 way abd films in am   Electronic Signatures for Addendum Section:  Loistine Simas (MD) (Signed Addendum 08-Apr-13 23:03)  multiple labs pending   Electronic Signatures: Loistine Simas (MD)  (Signed 08-Apr-13 23:01)  Authored: Chief Complaint, VITAL SIGNS/ANCILLARY NOTES, Brief Assessment, Lab Results, Assessment/Plan   Last Updated: 08-Apr-13 23:03 by Loistine Simas (MD)

## 2014-06-14 NOTE — Consult Note (Signed)
Brief Consult Note: Patient was seen by consultant.   Consult note dictated.   Comments: 1.suspect systemic vasculitis manifest by purpuric rash with ulcers, colitis,anemia/thrombocytopenia,ARF resolved, proteinuria,elevated ferritin,low albumin, wt loss.    2 recent diffuse psoriasis responding to humira and cyclosporin 3 hyperuricemia     rec. skin bx, if possible colon bx,if c/w vasculitis then start iV solumedrol and consider medical center transfer for possible cytotoxic rx. ? repeat blood cultures.. pending cryoglobulins and hep C quant.  Electronic Signatures: Leeanne Mannan., Eugene Gavia (MD)  (Signed 09-Apr-13 18:41)  Authored: Brief Consult Note   Last Updated: 09-Apr-13 18:41 by Leeanne Mannan., Eugene Gavia (MD)

## 2014-06-14 NOTE — Consult Note (Signed)
Chief Complaint:   Subjective/Chief Complaint patient continues ill appearance, stated she did feel a little better today.  no nausea.  denies abdominal pain.  pain in feet.   VITAL SIGNS/ANCILLARY NOTES: **Vital Signs.:   10-Apr-13 17:50   Vital Signs Type Q 4hr   Temperature Temperature (F) 98.6   Celsius 37   Temperature Source oral   Pulse Pulse 118   Pulse source per Dinamap   Respirations Respirations 18   Systolic BP Systolic BP 482   Diastolic BP (mmHg) Diastolic BP (mmHg) 61   Mean BP 75   BP Source Dinamap   Pulse Ox % Pulse Ox % 96   Pulse Ox Activity Level  At rest   Oxygen Delivery Room Air/ 21 %   Brief Assessment:   Cardiac tachycardia    Respiratory clear BS    Gastrointestinal details normal No rebound tenderness  moderate to mild distension, postiive bowel sounds     Routine Chem:  10-Apr-13 04:59    Magnesium, Serum 1.4  Routine Hem:  10-Apr-13 04:59    WBC (CBC) 17.6   RBC (CBC) 2.58   Hemoglobin (CBC) 9.6   Hematocrit (CBC) 29.1   Platelet Count (CBC) 104   MCV 113   MCH 37.3   MCHC 33.1   RDW 16.5   Neutrophil % 83.0   Lymphocyte % 9.1   Monocyte % 5.8   Eosinophil % 1.8   Basophil % 0.3   Neutrophil # 14.6   Lymphocyte # 1.6   Monocyte # 1.0   Eosinophil # 0.3   Basophil # 0.1  Routine Chem:  10-Apr-13 04:59    Glucose, Serum 105   BUN 31   Creatinine (comp) 1.65   Sodium, Serum 132   Potassium, Serum 4.2   Chloride, Serum 97   CO2, Serum 24   Calcium (Total), Serum 8.1  Hepatic:  10-Apr-13 04:59    Bilirubin, Total 0.8   Alkaline Phosphatase 84   SGPT (ALT) 7   SGOT (AST) 19   Total Protein, Serum 4.7   Albumin, Serum 1.6  Routine Chem:  10-Apr-13 04:59    Osmolality (calc) 271   eGFR (African American) 42   eGFR (Non-African American) 34   Anion Gap 11   Assessment/Plan:  Assessment/Plan:   Assessment 1) renal failure-improved, some increase of creatine today 2) hypoalbuminemia-renal and GI loss.  no diarrhea   for 2 days.  3) enteritis-initial impression infective, possible vasculitis 4) atypical petechial/purpura rash-seen by dermatology today and biopsy taken, may be resulted tomorrow. 5) positive hep c ab-awaiting reflex to PCR, as 15% of positive ab are negative for virus    Plan 1) repeat abd films tomorrow, may need to repeat CT sooner.  2) awaiting derm bx results discussed with Dr Holley Raring, recommend renal consult, will reorder albumin  am after labs.   Electronic Signatures for Addendum Section:  Loistine Simas (MD) (Signed Addendum 10-Apr-13 21:32)  patietn continues to relate no history of abdominal pain, with acute onset of the diarrhea symptoms.   Electronic Signatures: Loistine Simas (MD)  (Signed 10-Apr-13 21:23)  Authored: Chief Complaint, VITAL SIGNS/ANCILLARY NOTES, Brief Assessment, Lab Results, Assessment/Plan   Last Updated: 10-Apr-13 21:32 by Loistine Simas (MD)

## 2014-06-14 NOTE — Consult Note (Signed)
PATIENT NAME:  Stephanie Raymond, Stephanie Raymond MR#:  [id number] DATE OF BIRTH:  05/06/1955  DATE OF CONSULTATION:  05/30/2011  REFERRING PHYSICIAN:  Dr. Holley Raring CONSULTING PHYSICIAN:  Emmaline Kluver., MD  REASON FOR CONSULTATION: Rule out vasculitis.   HISTORY OF PRESENT ILLNESS: 59 year old white female complicated story. She has had a history of mild psoriasis and hypertension. Last year she had an acute flare of psoriasis covering a good part of her body. She saw Dr. Phillip Heal. She was treated initially with creams then placed on Humira. She had a partial response and then was added cyclosporin. She had some improvement, was on laser treatment. She then in the summer went to vacation in June thought she was some better. At that time she started gradually losing weight. She went to the beach in October and felt like she was not well, felt like she was colder than usual even though it was 60 degrees outside. She had some swelling in her legs. She had an episode of swelling in the wrist, which resolved. She had Emergency Room visit for pain in the feet on two occasions, was given analgesics. She has never had podagra of which she is aware. She had some pain in both arms, felt like she had bursitis, was seen by Dr. Earnestine Leys. Said she had some labs drawn at that time which were abnormal. She had a referral to hematology which she changed and didn't keep. In the last 3 or 4 months she has had some mild constipation, was seen by her primary physician. She was on Activia which she took regularly and then every other day. She continued to lose weight and felt like she was not feeling well. About three weeks ago she had some numbness and tingling in both hands. She had a history of carpal tunnel, was seen in the [place] walk in clinic, was told it was probably her carpal tunnel, was told to wear her splints and given a prednisone taper. About a week after that she developed a rash in her abdomen, hands, lower extremities.  She thought it was related to the prednisone which she had stopped. Around the same time, she went to [facility], developed acute diarrhea. She had lost weight, had not felt well. Came back and was seen in the Emergency Room where she had hypotension, renal insufficiency and a CT scan that showed colitis. She was admitted. Course was pertinent for anemia, elevated ferritin, low platelet count, improving renal insufficiency. She has still been tachycardic. She still has abdominal pain. She developed a good deal edema, the rashes got worse. She felt like she has had painful areas of ulceration of the rash in her legs, particularly the right ankle. She had labs pertinent for elevated lipase, mild leukocytosis, sedimentation rate 32, albumin 1.4. Rheumatoid factor, B12 negative. Hepatitis C antibody positive at 7.8 pending quantification. Mildly elevated IgA. Low protein, serum protein electrophoresis. Negative ANA. Stools initially positive for blood. She has an elevated protein creatinine ratio but not nephrotic. She is hyperuricemic with a uric acid of 11 ferritin, 699. Urinalysis showed mild proteinuria, 2 red cells, 7 white cells. Urine culture and blood culture have been negative.   PAST MEDICAL HISTORY:  1. Obesity.  2. Hypertension.  3. Psoriasis, status post cyclosporine and Humira.  4. Hypothyroid.  5. Depression.   SOCIAL HISTORY: No significant cigarettes or alcohol.   FAMILY HISTORY: Negative for connective tissue disease.   REVIEW OF SYSTEMS: no headache, shortness of breath. She had numbness  and tingling in her hands but not her feet. She does have abdominal pain and diarrhea, swelling in legs. No particular current joint pain otherwise per history.   PHYSICAL EXAMINATION:  GENERAL: Ill-appearing female seen sitting in bed.   VITAL SIGNS: Temperature 99.1, pulse 117, blood pressure 97/58 then 102/72, oxygen saturation 97.   SKIN: Diffuse purpuric lesions hands, abdomen and legs. The  legs around the right ankle have some ulcerative lesions, nonblanching. painful.   HEENT: Sclerae clear. Clear oropharynx.   NECK: No thyromegaly.   CHEST: Clear.   HEART: Tachycardic. I do not hear definite murmur.   ABDOMEN: Diffuse abdominal distention with decreased bowel sounds, not particularly tender.   EXTREMITIES: She has 2 to 3+ lower extremity edema.   MUSCULOSKELETAL: Good range of motion neck and shoulders. Hands without synovitis. Negative Tinel's. No knee effusions. Mild reduced range of motion of ankles. She is diffusely hyporeflexic. Can move all extremities. Reasonable grip.   IMPRESSION:  1. 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 Colitis could possibly also be inflammatory bowel disease given her history of psoriasis and prior cessation of cyclosporine and Humira.  2. Anemia and thrombocytopenia.  3. Acute renal insufficiency with resolution. Proteinuria, elevated ferritin, hyperuricemia. Cannot rule out some of her pain in her joints has been gouty. Elevated lipase. Low albumin state, probably related to the above.  4. History of psoriasis, status post cyclosporine and Humira. 5. Weight  loss, multifactorial probably related to acute illness.   RECOMMENDATIONS:  1. Need tissue diagnosis, best will be skin biopsy. Be safe to have a picture of the colon with a biopsy to make sure this is an inflammatory bowel disease or ischemic.  2. If biopsy validates vasculitis then begin IV steroids. May need cytotoxic therapy and would probably need medical center opinion.  3. Pending cryoglobulins.  4. Will add two blood cultures and make sure it is not bacteremia or sepsis since she is now off antibiotics.  ____________________________ Emmaline Kluver., MD gwk:cms D: 05/30/2011 18:56:27 ET T: 05/31/2011 08:14:48 ET JOB#: [id number]  cc: Emmaline Kluver., MD, <Dictator> Marne A. Tower, MD Ovidio Hanger MD ELECTRONICALLY SIGNED 05/31/2011 11:07

## 2014-06-14 NOTE — Discharge Summary (Signed)
PATIENT NAME:  Stephanie Raymond, QUAYLE MR#:  [id number] DATE OF BIRTH:  10-16-1955  DATE OF ADMISSION:  05/25/2011 DATE OF DISCHARGE:  06/04/2011  ADDENDUM: The patient got transferred to [facility] on 06/04/2011. The reason for transfer suspect systemic vasculitis with multiorgan involvement, may need cytotoxic therapy.  Please refer to previous interim discharge summary by Dr. Dagoberto Ligas, on 05/31/2011.   DISCHARGE DIAGNOSES:  1. 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 2 Initial presentation of  systemic inflammatory response syndrome and hypovolemic shock, may be secondary to enterocolitis. 3. Acute renal failure with underlying chronic kidney disease. 4. Anemia with iron deficiency thrombocytopenia, hypoalbuminemia, and anasarca with severe lower extremity edema. 5. Possible hepatic impairment with hypoalbuminemia and coagulopathy. 6. elevated lipase. May be secondary to pancreatitis versus colonic in origin. 7. 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 8. History of psoriasis.  CONSULTANTS: 1. Loistine Simas, MD - Gastroenterology. 2. Cristi Loron, MD - Rheumatology. 3. [employer]. 4. Delight Hoh, MD - Hematology/Oncology.  HOSPITAL COURSE: In short, this is a 59 year old female who has history of hypertension, hypothyroidism, depression, psoriasis, and gastroesophageal reflux disease. She was admitted with systemic inflammatory response syndrome with colitis and acute renal failure. When she presented initially she was hypotensive with white count of 19,000. CT of the abdomen was suggestive of enterocolitis and because of hypotension she was admitted to the Intensive Care Unit. When she came in, she was in renal failure, which was thought to be secondary to prerenal initially. Her creatinine was 3.79, BUN  81, and  serum sodium was 128. The only available baseline renal function to [place] is from October 2012 which shows a BUN of 27 and creatinine 3.1. She was started on fluids and IV antibiotics. After her blood pressure improved, she was transferred out of the Intensive Care Unit. Gastroenterology was consulted because of her enterocolitis and nephrology saw her because of her acute renal failure. Nephrology thinks that her renal failure initially could have some prerenal component along with ATN because she presented with hypotension. Her renal function has been worsening and her creatinine on 06/03/2011 was 3.48 with a BUN of 49, still hyponatremic with a sodium of 129 and mildly hyperkalemic. She also was found to have multiple other lab abnormalities. She was hypoalbuminemic with albumin of 1.8. She had significant third spacing anasarca with significant lower extremity edema. She was also found to be coagulopathic with INR of around 1.5 although her liver enzymes were normal with normal ALT of 8 and normal AST of 22. She was also found to be anemic and thrombocytopenic with a hemoglobin of around 10, MCV was 114 - macrocytic, and platelet count around 119,000, on 06/01/2011.  She also developed some purpuric lesions with some necrosis in them. Dermatology was consulted for a skin biopsy because the patient was too sick to have endoscopic luminal evaluation.  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 Rheumatology was consulted and we sent serologies on her. Her C-reactive protein was elevated at 59.9. Vitamin B12 was on the higher side at 1784. Her RA factor was negative. Her prealbumin was extremely low at 6. Hepatitis B surface antigen was negative. Hepatitis C antibodies were high so she has hepatitis  C antibodies, but hepatitic C RNA was not detected. Hepatitis B surface antibodies were  negative. Serum cryoglobulins were negative. ANA was essentially negative. Anti-RNP antibody was 0.7, in the normal range. The patient had ANCA panel done that was negative. Her complement levels were normal. No hypocomplementemia. C3 levels were normal. C4 levels were normal. CH-50 levels were normal. Her serum protein electrophoresis did not show any M spike. IgA levels were on the higher side at 576. Free T3 was low at 1.8. Giardia antibodies and ova and parasites were negative. So the only positive thing was the anti-HCV antibody was positive, but hepatitis C RNA was undetectable. Kappa/lambda ratio was normal at 1.21 with free kappa chains and lambda light chains were high. Serum transferrin was low 183. PTH was high at 181. So as per rheumatology, there was a suspicion for vasculitis, which could be secondary to hepatitis C or any infection or maybe Hemoch schnolein purpura with elevated IgA, buttock lesions, and GI lesions, but they were ANCA negative. The patient was started on IV Solu-Medrol 40 mg every 12 hours, started on triamcinolone cream for open skin lesions, and he suggested to transfer her to a tertiary medical center because of the complexity of her problem. She was continued on Cipro and Flagyl for her enterocolitis, but her diarrhea had improved. She was having leukocytosis also which was most likely inflammatory. Her white count had gone up to 24.5 thousand, but she was also on steroids. She was mildly hypoerkalemic and on 06/03/2011 and she got some Kayexalate for that. Her urinalysis was reviewed by nephrology and showed too numerous muddy brown casts, hyaline casts, proteinaceous material, and debris. No cellular casts were seen, rare WBCs, and rare RBCs. So the renal involvement is most likely secondary to ATN rather than vasculitis. Her urine protein to creatinine ratio came in the range of 237 to 525,  which was not in the nephrotic range, milligram/gram. Her blood cultures were all negative, negative for C. difficile. Stool cultures were negative. She also had an echocardiogram done because of her anasarca and lower extremity edema and her ejection fraction was greater than 55%, essentially normal. Doppler ultrasound of the lower extremities were negative and the CT of the abdomen and pelvis without contrast, when she came in, showed thickening of the wall of a portion of the colon and areas of small bowel suggestive of enterocolitis along with some cholelithiasis.  ___________________________ Mena Pauls, MD ag:slb D: 06/04/2011 14:02:56 ET T: 06/06/2011 10:33:46 ET JOB#: [id number]  cc: Mena Pauls, MD, <Dictator> Marne A. Tower, MD [place] MD ELECTRONICALLY SIGNED 06/12/2011 12:04

## 2014-06-14 NOTE — Consult Note (Signed)
PATIENT NAME:  Stephanie Raymond, Stephanie Raymond MR#:  [id number] DATE OF BIRTH:  10-09-1955  DATE OF CONSULTATION:  05/26/2011  REFERRING PHYSICIAN:   CONSULTING PHYSICIAN:  Theodore Demark, NP  HISTORY OF PRESENT ILLNESS: Stephanie Raymond is a pleasant 59 year old Caucasian female with a history of hypothyroidism, depression, hypertension, and psoriasis who has recently been admitted with SIRS, colitis, and acute renal failure. Gastroenterology has been consulted by Dr. Doy Hutching to evaluate for colitis. She has no history of prior luminal evaluation and states that she started with nausea and diarrhea a few days ago on the way to the [place]. She has had several brown, loose stools of varying sizes daily since then. She denies any history of fever and abdominal pain or emesis. No known triggers. States that she did eat an [place] sub from [place] on the way to [place], but denies other dietary changes prior to this episode of illness. She states that she uses well water, has cats and dogs and eats at restaurants rarely. She denies travel and sick exposures, recent antibiotics. She does state she was treated at an urgent care recently as well for some carpal tunnel with some prednisone which she stopped taking due to an outbreak of a red, freckly rash to her hands and feet. She does have a lingual ulceration at present, but denies history of oral ulcers. Never took Accutane. Denies vitamin and iron deficiencies. Denies problems swallowing, black tarry stools, bloody or mucousy stools, weight changes, acid reflux symptoms, and other gastrointestinal complaints.  I have noted a low grade temperature and hypotension with this admission. Stool studies are pending. She has had a renal consultation due to her ARF and she is receiving IV fluids and Zosyn for her presumed enterocolitis.   PAST MEDICAL HISTORY:  1. Gastroesophageal reflux disease. 2. Hypothyroidism. 3. Depression. 4. Environmental allergies.  5. Benign  prostatic hypertrophy.  6. Psoriasis.   MEDICATIONS:  1. Dyazide 1 p.o. daily. 2. Avapro 150 mg p.o. daily. 3. Omeprazole 20 mg p.o. daily. 4. Wellbutrin SR 150 mg p.o. twice a day. 5. Synthroid 0.05 mg p.o. daily.   ALLERGIES: Prednisolone.  SOCIAL HISTORY: Positive for cigarette smoking. No EtOH or illicits. Lives with husband.   FAMILY HISTORY: Significant for hypertension. No autoimmune disorders, colorectal cancer, liver disease, or peptic ulcer disease.  REVIEW OF SYSTEMS: CONSTITUTIONAL: The patient states she has not had fever, but has been febrile with this admission. Denies changes to weight. Significant for fatigue. HEENT: No blurry or double vision, glaucoma, tinnitus, hearing loss, nosebleeds, or problems swallowing. RESPIRATORY: Denies shortness of breath, cough, or wheezing. CARDIOVASCULAR: Denies chest pain, orthopnea, palpitations, or syncope. GI: As noted above. GENITOURINARY: No dysuria, hematuria, or incontinence. ENDOCRINE: No history of diabetes. Does have history of hypothyroidism, but denies hot or cold intolerances. HEMATOLOGIC: No history of anemia, easy bruising, or bleeding. MUSCULOSKELETAL: Denies arthralgia, myalgia, and gout. NEUROLOGIC: No numbness or tingling, history of CVA, fainting, dizziness, or seizures. Does feel generally weak. PSYCH: Denies history of depression, anxiety, and insomnia, although does take Wellbutrin twice a day.  LABS/STUDIES: Most recent lab work: Glucose 69, BUN 74, creatinine 2.72, sodium 131, potassium 3.4, CO2 16, GFR 19, calcium 5.6, ionized calcium pending, phosphorus 3.5, magnesium 2.2, serum protein 4.7, albumin 1.9, total bilirubin one, ALT 70, AST 27, and alkaline phosphatase 10. TSH 7.66. Lipase 619. WBC 12.3, hemoglobin 9.9, hematocrit 29.3, and platelets 105. Does have marked macrocytosis with increased RDW at 10.3. TIBC 178 and  ferritin 699. A1c  4.7. Uric acid 11.   Blood cultures drawn 05/25/2011 with no  growth.  Urinalysis had 1+ leukocytes, 1+ blood, no nitrite.   EKG revealed sinus tachycardia.  Hepatitis C antibody, ANA, C3, C4, complement, free kappa and lambda, PTH, protein electrophoresis, and vitamin D results are pending.   Chest x-ray did not demonstrate congestive heart failure or pneumonia.  CT without contrast did demonstrate a borderline sized liver without mass. There was some cholelithiasis. Her spleen was unremarkable. Pancreas was normal. There was a small amount of ascites and some thickening of the colon, especially in the ascending and transverse colon. There was some thickening of the loops of small bowel suggestive of enterocolitis. There was no adenopathy, abscess or perforation, or enlarged mesenteric lymph nodes.   PHYSICAL EXAM:   VITAL SIGNS: Most recent vital signs: Temperature 99, pulse 100, respiratory rate 25, blood pressure 87/45 and oxygen saturation 98%. Last fingerstick was 87.   GENERAL: The patient is acutely ill appearing, somewhat lethargic. Does respond well to questions and does not require assistance to stay awake.   PSYCH: Pleasant and cooperative. Mood stable. A little bit slow to respond to questions.   HEENT: Normocephalic, atraumatic. Sclerae without icterus. Conjunctiva are clear. No redness, drainage, or inflammation to the eyes or nares. There is a very small ulceration on the anterior  tongue. Otherwise her oral mucous membranes are pink and moist.   NECK: No JVD, adenopathy, or thyromegaly.   LUNGS: Mild tachypnea, respirations somewhat shallow, essentially clear, and somewhat diminished at the bases. Respiratory effort is normal.   CARDIAC: S1 and S2. Regular rate and rhythm. No MRG. Peripheral pulses are 2+ to the radials and 1+ to the dorsalis pedis bilaterally. There is faint generalized edema.   ABDOMEN: Obese, bowel sounds x4, soft, and nontender today. No guarding or rebound tenderness. No organomegaly, masses, hernias, or  guarding.   RECTAL: Deferred.   GENITOURINARY: Deferred.   EXTREMITIES: Without clubbing or cyanosis.   SKIN: Warm, dry, somewhat pale. Does have petechial or purpuric rash to her fingers, slightly on the forearms, but more pronounced to her feet, toes, and ankle. No other rash or lesion are noted.   NEUROLOGIC: Alert and oriented x3. Cranial nerves II through XII grossly intact. Speech clear, but slow. No facial droop.   IMPRESSION AND PLAN:  Diarrhea: Evidently there was some abdominal pain at one point, which the patient states is not present now. Concern exists for infectious process. Noted stool studies are ordered. We will await for their results. Would continue with antibiotics and supportive symptomatic treatment as you are. Further recommendations to follow.   These services were provided by Stephens November, MSN, NP-C in collaboration with Loistine Simas, MD.  ____________________________ Theodore Demark, NP chl:slb D: 05/27/2011 09:35:28 ET T: 05/27/2011 10:06:44 ET JOB#: [id number]  cc: Theodore Demark, NP, <Dictator> [place] SIGNED 05/30/2011 8:34

## 2014-06-14 NOTE — Consult Note (Signed)
Chief Complaint:   Subjective/Chief Complaint Covering for Dr. Gustavo Lah. Awaiting transfer to [facility]. No abd pain or diarrhea.   VITAL SIGNS/ANCILLARY NOTES: **Vital Signs.:   13-Apr-13 05:54   Vital Signs Type Routine   Temperature Temperature (F) 97.6   Celsius 36.4   Temperature Source oral   Pulse Pulse 103   Pulse source per Dinamap   Respirations Respirations 19   Systolic BP Systolic BP 96   Diastolic BP (mmHg) Diastolic BP (mmHg) 64   Mean BP 74   BP Source Dinamap   Pulse Ox % Pulse Ox % 98   Pulse Ox Activity Level  At rest   Oxygen Delivery Room Air/ 21 %   Brief Assessment:   Cardiac Regular    Respiratory clear BS    Gastrointestinal Normal    Additional Physical Exam Diffuse vasculitic rash   Routine Chem:  13-Apr-13 05:15    Glucose, Serum 128   BUN 49   Creatinine (comp) 3.48   Sodium, Serum 129   Potassium, Serum 5.8   Chloride, Serum 96   CO2, Serum 21   Calcium (Total), Serum 8.3   Osmolality (calc) 274   eGFR (African American) 16   eGFR (Non-African American) 14   Anion Gap 12   Assessment/Plan:  Assessment/Plan:   Assessment Diarrhea and abd pain resolved. Pt with vasculitis and hep c.    Plan Awaiting transfer to [facility]. Thanks   Electronic Signatures: Verdie Shire (MD)  (Signed 13-Apr-13 10:47)  Authored: Chief Complaint, VITAL SIGNS/ANCILLARY NOTES, Brief Assessment, Lab Results, Assessment/Plan   Last Updated: 13-Apr-13 10:47 by Verdie Shire (MD)

## 2014-06-14 NOTE — Consult Note (Signed)
Chief Complaint:   Subjective/Chief Complaint patient feeling some better today, denies nausea or abdominal pain.   VITAL SIGNS/ANCILLARY NOTES: **Vital Signs.:   06-Apr-13 11:00   Pulse Pulse 96   Respirations Respirations 19   Systolic BP Systolic BP 97   Diastolic BP (mmHg) Diastolic BP (mmHg) 47   Mean BP 67   Pulse Ox % Pulse Ox % 100   Pulse Ox Heart Rate 98   Brief Assessment:   Cardiac Regular    Respiratory clear BS    Gastrointestinal details normal Soft  Nontender  Nondistended  No masses palpable  Bowel sounds normal     Routine Hem:  06-Apr-13 06:29    WBC (CBC) 9.9   RBC (CBC) 2.53   Hemoglobin (CBC) 9.7   Hematocrit (CBC) 28.7   Platelet Count (CBC) 128   MCV 113   MCH 38.3   MCHC 33.8   RDW 15.7   Neutrophil % 83.3   Lymphocyte % 9.7   Monocyte % 5.8   Eosinophil % 1.0   Basophil % 0.2   Lymphocyte # 1.0   Monocyte # 0.6   Eosinophil # 0.1   Basophil # 0.0  Routine Chem:  06-Apr-13 06:29    Glucose, Serum 98   BUN 49   Creatinine (comp) 1.29   Sodium, Serum 136   Potassium, Serum 3.7   Chloride, Serum 109   CO2, Serum 15   Calcium (Total), Serum 5.7  Hepatic:  06-Apr-13 06:29    Bilirubin, Total 0.9   Alkaline Phosphatase 93   SGPT (ALT) 11   SGOT (AST) 26   Total Protein, Serum 4.7   Albumin, Serum 1.8  Routine Chem:  06-Apr-13 06:29    Osmolality (calc) 285   eGFR (African American) 55   eGFR (Non-African American) 46   Anion Gap 12   Lipase 1085   Magnesium, Serum 1.6  Thyroid:  06-Apr-13 06:29    Thyroxine, Free 1.60   Assessment/Plan:  Assessment/Plan:   Assessment 1) diarrheal illness-seems to be improving, renal fnx improved with hydration, wbc improving. bp stable/improved. 2) multiple sx occuring prior to current episode that led to hospitalization. Supplemental history obtained last night as follows.  Patient relates marked loss of appetits for a week prior to the beginning of the symptoms of diarrhea.  She  relates a h/o 40-50# weight loss over the past 4-5 months thiat is unintentional.  She has a history of psoriasis and psoriatic arthritis, was placed on humira and clobetasol cream which she took from 3/12-10/12.  She eventually stopped this regimen due to occurance of severe discomfort and erythema of the plantar and palmar surfaces, with episode of "I couldn't walk" occuring in June.  She stopped the humira herself.    Plan 1) case discussed with Dr Verl Blalock.  Unusual presentation in light of above history.  Certainly evidence and history to support dx of enterocolitis, note heme positive stool, diarrhea, abnormal CT, but denies abdominal pain.  I have not seen rash/purpura as a feature associated with enterocolitis. 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  Recommended Ig screen, complement, Panca, Canca.  Will add hepatitis serology for b/c and crypglobulins.  Following.  Agree with heme onc consult. continue current.   Electronic Signatures: Loistine Simas (MD)  (Signed 06-Apr-13 15:50)  Authored: Chief Complaint, VITAL SIGNS/ANCILLARY NOTES, Brief Assessment, Lab Results, Assessment/Plan   Last Updated: 06-Apr-13 15:50 by Loistine Simas (MD)

## 2014-06-14 NOTE — Consult Note (Signed)
Chief Complaint:   Subjective/Chief Complaint abdomen less distended, no abdominal pain, no nausea.  Stephanie Raymond trying to drink ensure.  continues with pain in legs/feet.   VITAL SIGNS/ANCILLARY NOTES: **Vital Signs.:   12-Apr-13 09:36   Vital Signs Type Q 4hr   Temperature Temperature (F) 97.9   Celsius 36.6   Temperature Source oral   Pulse Pulse 109   Respirations Respirations 18   Systolic BP Systolic BP 87   Diastolic BP (mmHg) Diastolic BP (mmHg) 61   Mean BP 69   Pulse Ox % Pulse Ox % 93   Pulse Ox Activity Level  At rest   Oxygen Delivery Room Air/ 21 %   Brief Assessment:   Cardiac Regular    Respiratory clear BS    Gastrointestinal details normal Nontender  Bowel sounds normal  No gaurding  mild distension   Routine Chem:  12-Apr-13 04:24    Glucose, Serum 105   BUN 43   Creatinine (comp) 2.81   Sodium, Serum 128   Potassium, Serum 4.9   Chloride, Serum 95   CO2, Serum 19   Calcium (Total), Serum 9.0  Hepatic:  12-Apr-13 04:24    Albumin, Serum 1.8  Routine Chem:  12-Apr-13 04:24    Osmolality (calc) 268   eGFR (African American) 21   eGFR (Non-African American) 18   Anion Gap 14   Phosphorus, Serum 5.4  Routine UA:  12-Apr-13 10:15    Color (UA) Red   Clarity (UA) Cloudy   Glucose (UA) Negative   Bilirubin (UA) Negative   Ketones (UA) Negative   Specific Gravity (UA) 1.026   Blood (UA) Negative   pH (UA) 5.0   Protein (UA) Negative   Nitrite (UA) Negative   Leukocyte Esterase (UA) Trace   RBC (UA) <1 /HPF   Mucous (UA) PRESENT   Hyaline Cast (UA) 3 /LPF  Misc Urine Chem:  12-Apr-13 10:15    Creatinine, Urine 198.5   Protein, Random Urine 47   Protein/Creat Ratio (comp) 237   Radiology Results: XRay:    11-Apr-13 10:10, Abdomen Flat and Erect   Abdomen Flat and Erect    REASON FOR EXAM:    abdominal distention  COMMENTS:       PROCEDURE: DXR - DXR ABDOMEN 2 V FLAT AND ERECT  - Jun 01 2011 10:10AM     RESULT: Flat and erect views of  the abdomen were obtained and are   compared to a prior exam of 05/30/2011. No subdiaphragmatic free air is   seen. No significantly dilated loops of bowel are identified. The small   bowel dilatation evident on the prior exam is no longer definitely seen.   No abnormal intraabdominal calcifications are noted. The osseous   structures are normal in appearance.    IMPRESSION:   1. No acute changes are identified.    Thank you for the opportunity to contribute to the care of your patient.           Verified By: Dionne Ano WALL, M.D., MD   Assessment/Plan:  Assessment/Plan:   Assessment 1) acute diarrheal illness-resolved.  Stephanie Raymond relates a small bm but nothing noted.  no abdominal pain.   2) renal insufficiency/ ARF-some increased creat today.  minimal uop.   3) rash/purpura-biopsied by derm, awaiting result. 4) hypoalbuminemia-gi and renal losses    Plan 1) repeat ct with oral contrast as possible. 2) may need luminal evaluation depending on diagnostic implications of skin biopsy and result of CT.  Dr Candace Cruise following over the weekend.   Electronic Signatures: Loistine Simas (MD)  (Signed 12-Apr-13 12:11)  Authored: Chief Complaint, VITAL SIGNS/ANCILLARY NOTES, Brief Assessment, Lab Results, Radiology Results, Assessment/Plan   Last Updated: 12-Apr-13 12:11 by Loistine Simas (MD)

## 2014-06-22 ENCOUNTER — Ambulatory Visit
Admission: RE | Admit: 2014-06-22 | Discharge: 2014-06-22 | Disposition: A | Discharge status: Home / Self Care | Payer: No Typology Code available for payment source | Source: Ambulatory Visit | Attending: Family Medicine | Admitting: Family Medicine

## 2014-06-22 ENCOUNTER — Other Ambulatory Visit: Payer: Self-pay | Admitting: Family Medicine

## 2014-06-22 DIAGNOSIS — N63 Unspecified lump in breast: Secondary | ICD-10-CM | POA: Insufficient documentation

## 2014-06-22 DIAGNOSIS — N6320 Unspecified lump in the left breast, unspecified quadrant: Secondary | ICD-10-CM

## 2014-06-22 DIAGNOSIS — N6325 Unspecified lump in the left breast, overlapping quadrants: Secondary | ICD-10-CM

## 2014-06-30 ENCOUNTER — Encounter: Payer: Self-pay | Admitting: Family Medicine

## 2014-07-31 ENCOUNTER — Other Ambulatory Visit: Payer: Self-pay | Admitting: Family Medicine

## 2014-08-03 NOTE — Telephone Encounter (Signed)
Please confirm with her whether she would rather have the 20 once or the 10 bid  Thanks  Whichever she chooses refill times 12mo

## 2014-08-03 NOTE — Telephone Encounter (Signed)
Received refill request electronically from pharmacy.  Request does not match the medication list which shows 20 meq once a day. Please confirm dose and directions.

## 2014-08-04 ENCOUNTER — Telehealth: Payer: Self-pay | Admitting: *Deleted

## 2014-08-04 DIAGNOSIS — E876 Hypokalemia: Secondary | ICD-10-CM

## 2014-08-04 MED ORDER — FUROSEMIDE 80 MG PO TABS: ORAL_TABLET | ORAL | Status: DC

## 2014-08-04 MED ORDER — POTASSIUM CHLORIDE 20 MEQ PO PACK: 20.0000 meq | PACK | Freq: Every day | ORAL | Status: DC

## 2014-08-04 MED ORDER — LEVOTHYROXINE SODIUM 50 MCG PO TABS: ORAL_TABLET | ORAL | Status: DC

## 2014-08-04 MED ORDER — OMEPRAZOLE 20 MG PO CPDR: DELAYED_RELEASE_CAPSULE | ORAL | Status: DC

## 2014-08-04 MED ORDER — ALBUTEROL SULFATE HFA 108 (90 BASE) MCG/ACT IN AERS: 2.0000 | INHALATION_SPRAY | RESPIRATORY_TRACT | Status: DC | PRN

## 2014-08-04 MED ORDER — BUPROPION HCL ER (SR) 150 MG PO TB12: ORAL_TABLET | ORAL | Status: DC

## 2014-08-04 NOTE — Telephone Encounter (Signed)
Pt's insurance changed to Medicare and she need new Rxs sent to mail order pharmacy, is it okay to send, please advise

## 2014-08-04 NOTE — Telephone Encounter (Signed)
Rx sent to pharmacy and left voicemail requesting pt to call office back

## 2014-08-04 NOTE — Telephone Encounter (Signed)
Pt is taking the 74meq packets Rx sent to pharmcy

## 2014-08-04 NOTE — Telephone Encounter (Signed)
Please send for 3 mo with a refill  Schedule lab for cmet please also for hypokalemia -thanks

## 2014-08-07 NOTE — Telephone Encounter (Signed)
Pt left v/m requesting cb. 

## 2014-08-07 NOTE — Telephone Encounter (Signed)
Called pt and no answer and phone kept ringing but no voicemail

## 2014-08-07 NOTE — Telephone Encounter (Signed)
Patient returned Shapale's call. Please call patient at  [phone].  If patient doesn't answer she asked for Shapale to leave her a detailed message on her voice mail.

## 2014-08-07 NOTE — Telephone Encounter (Signed)
Lab appt scheduled for 08/11/14, please put lab orders in, you said you wanted cmet please for hypokalemia

## 2014-08-10 NOTE — Addendum Note (Signed)
Addended by: Loura Pardon A on: 08/10/2014 10:22 AM   Modules accepted: Orders

## 2014-08-11 ENCOUNTER — Other Ambulatory Visit: Payer: No Typology Code available for payment source

## 2014-08-12 MED ORDER — POTASSIUM CHLORIDE CRYS ER 20 MEQ PO TBCR: 20.0000 meq | EXTENDED_RELEASE_TABLET | Freq: Every day | ORAL | Status: DC

## 2014-08-12 NOTE — Addendum Note (Signed)
Addended by: Tammi Sou on: 08/12/2014 03:29 PM   Modules accepted: Orders, Medications

## 2014-08-12 NOTE — Telephone Encounter (Signed)
Pt left v/m; [facility] will not fill rx for potassium since sent in as powder rather than tablet. Pt request K  rx changed to tabs. 08/04/14 note has pt taking the 20 meq packets.Please advise.

## 2014-08-26 DIAGNOSIS — M47812 Spondylosis without myelopathy or radiculopathy, cervical region: Secondary | ICD-10-CM | POA: Diagnosis not present

## 2014-08-26 DIAGNOSIS — S12030D Displaced posterior arch fracture of first cervical vertebra, subsequent encounter for fracture with routine healing: Secondary | ICD-10-CM | POA: Diagnosis not present

## 2014-08-26 DIAGNOSIS — S12001A Unspecified nondisplaced fracture of first cervical vertebra, initial encounter for closed fracture: Secondary | ICD-10-CM | POA: Diagnosis not present

## 2014-08-26 DIAGNOSIS — M4312 Spondylolisthesis, cervical region: Secondary | ICD-10-CM | POA: Diagnosis not present

## 2014-08-26 DIAGNOSIS — Z79899 Other long term (current) drug therapy: Secondary | ICD-10-CM | POA: Diagnosis not present

## 2014-08-26 DIAGNOSIS — S12030S Displaced posterior arch fracture of first cervical vertebra, sequela: Secondary | ICD-10-CM | POA: Diagnosis not present

## 2014-08-31 ENCOUNTER — Telehealth: Payer: Self-pay

## 2014-08-31 DIAGNOSIS — N6002 Solitary cyst of left breast: Secondary | ICD-10-CM

## 2014-08-31 NOTE — Telephone Encounter (Signed)
Pt left v/m; pt request f/u lt breast exam at [facility]. From 06/22/14 [place] report pt is to have repeat lt breast US in 3 months. Pt request cb.

## 2014-08-31 NOTE — Telephone Encounter (Signed)
Order done

## 2014-09-24 ENCOUNTER — Ambulatory Visit: Payer: No Typology Code available for payment source

## 2014-09-30 ENCOUNTER — Ambulatory Visit: Payer: No Typology Code available for payment source

## 2014-10-01 ENCOUNTER — Ambulatory Visit: Payer: No Typology Code available for payment source

## 2014-10-15 ENCOUNTER — Other Ambulatory Visit: Payer: Self-pay | Admitting: Family Medicine

## 2014-10-15 ENCOUNTER — Ambulatory Visit
Admission: RE | Admit: 2014-10-15 | Discharge: 2014-10-15 | Disposition: A | Discharge status: Home / Self Care | Payer: Medicare Other | Source: Ambulatory Visit | Attending: Family Medicine | Admitting: Family Medicine

## 2014-10-15 DIAGNOSIS — R928 Other abnormal and inconclusive findings on diagnostic imaging of breast: Secondary | ICD-10-CM | POA: Insufficient documentation

## 2014-10-15 DIAGNOSIS — N6002 Solitary cyst of left breast: Secondary | ICD-10-CM

## 2014-12-19 ENCOUNTER — Emergency Department: Payer: Medicare Other

## 2014-12-19 ENCOUNTER — Encounter: Payer: Self-pay | Admitting: Emergency Medicine

## 2014-12-19 ENCOUNTER — Emergency Department
Admission: EM | Admit: 2014-12-19 | Discharge: 2014-12-19 | Disposition: A | Discharge status: Other Acute Inpatient Hospital | Payer: Medicare Other | Attending: Emergency Medicine | Admitting: Emergency Medicine

## 2014-12-19 DIAGNOSIS — I6523 Occlusion and stenosis of bilateral carotid arteries: Secondary | ICD-10-CM | POA: Diagnosis not present

## 2014-12-19 DIAGNOSIS — Y92002 Bathroom of unspecified non-institutional (private) residence single-family (private) house as the place of occurrence of the external cause: Secondary | ICD-10-CM | POA: Diagnosis not present

## 2014-12-19 DIAGNOSIS — T07 Unspecified multiple injuries: Secondary | ICD-10-CM | POA: Diagnosis not present

## 2014-12-19 DIAGNOSIS — L409 Psoriasis, unspecified: Secondary | ICD-10-CM | POA: Diagnosis not present

## 2014-12-19 DIAGNOSIS — F10929 Alcohol use, unspecified with intoxication, unspecified: Secondary | ICD-10-CM | POA: Diagnosis not present

## 2014-12-19 DIAGNOSIS — W1830XA Fall on same level, unspecified, initial encounter: Secondary | ICD-10-CM | POA: Diagnosis not present

## 2014-12-19 DIAGNOSIS — I10 Essential (primary) hypertension: Secondary | ICD-10-CM | POA: Diagnosis not present

## 2014-12-19 DIAGNOSIS — Z043 Encounter for examination and observation following other accident: Secondary | ICD-10-CM | POA: Diagnosis not present

## 2014-12-19 DIAGNOSIS — Z79899 Other long term (current) drug therapy: Secondary | ICD-10-CM | POA: Insufficient documentation

## 2014-12-19 DIAGNOSIS — W01198A Fall on same level from slipping, tripping and stumbling with subsequent striking against other object, initial encounter: Secondary | ICD-10-CM | POA: Insufficient documentation

## 2014-12-19 DIAGNOSIS — F10129 Alcohol abuse with intoxication, unspecified: Secondary | ICD-10-CM | POA: Diagnosis not present

## 2014-12-19 DIAGNOSIS — S12400A Unspecified displaced fracture of fifth cervical vertebra, initial encounter for closed fracture: Secondary | ICD-10-CM | POA: Diagnosis not present

## 2014-12-19 DIAGNOSIS — S12300A Unspecified displaced fracture of fourth cervical vertebra, initial encounter for closed fracture: Secondary | ICD-10-CM | POA: Diagnosis not present

## 2014-12-19 DIAGNOSIS — Z720 Tobacco use: Secondary | ICD-10-CM | POA: Insufficient documentation

## 2014-12-19 DIAGNOSIS — R220 Localized swelling, mass and lump, head: Secondary | ICD-10-CM | POA: Diagnosis not present

## 2014-12-19 DIAGNOSIS — Y9301 Activity, walking, marching and hiking: Secondary | ICD-10-CM | POA: Diagnosis not present

## 2014-12-19 DIAGNOSIS — F10120 Alcohol abuse with intoxication, uncomplicated: Secondary | ICD-10-CM | POA: Diagnosis not present

## 2014-12-19 DIAGNOSIS — F1010 Alcohol abuse, uncomplicated: Secondary | ICD-10-CM | POA: Diagnosis not present

## 2014-12-19 DIAGNOSIS — S12030A Displaced posterior arch fracture of first cervical vertebra, initial encounter for closed fracture: Secondary | ICD-10-CM | POA: Diagnosis not present

## 2014-12-19 DIAGNOSIS — I700 Atherosclerosis of aorta: Secondary | ICD-10-CM | POA: Diagnosis not present

## 2014-12-19 DIAGNOSIS — F17210 Nicotine dependence, cigarettes, uncomplicated: Secondary | ICD-10-CM | POA: Diagnosis not present

## 2014-12-19 DIAGNOSIS — S0512XA Contusion of eyeball and orbital tissues, left eye, initial encounter: Secondary | ICD-10-CM | POA: Diagnosis not present

## 2014-12-19 DIAGNOSIS — S0003XA Contusion of scalp, initial encounter: Secondary | ICD-10-CM | POA: Diagnosis not present

## 2014-12-19 DIAGNOSIS — S12090A Other displaced fracture of first cervical vertebra, initial encounter for closed fracture: Secondary | ICD-10-CM | POA: Diagnosis not present

## 2014-12-19 DIAGNOSIS — S0101XA Laceration without foreign body of scalp, initial encounter: Secondary | ICD-10-CM | POA: Diagnosis not present

## 2014-12-19 DIAGNOSIS — S12001A Unspecified nondisplaced fracture of first cervical vertebra, initial encounter for closed fracture: Secondary | ICD-10-CM | POA: Diagnosis not present

## 2014-12-19 DIAGNOSIS — S0990XA Unspecified injury of head, initial encounter: Secondary | ICD-10-CM | POA: Insufficient documentation

## 2014-12-19 DIAGNOSIS — Z90710 Acquired absence of both cervix and uterus: Secondary | ICD-10-CM | POA: Diagnosis not present

## 2014-12-19 DIAGNOSIS — S199XXA Unspecified injury of neck, initial encounter: Secondary | ICD-10-CM | POA: Diagnosis present

## 2014-12-19 DIAGNOSIS — M419 Scoliosis, unspecified: Secondary | ICD-10-CM | POA: Diagnosis not present

## 2014-12-19 DIAGNOSIS — M4312 Spondylolisthesis, cervical region: Secondary | ICD-10-CM | POA: Diagnosis not present

## 2014-12-19 DIAGNOSIS — M24561 Contracture, right knee: Secondary | ICD-10-CM | POA: Diagnosis not present

## 2014-12-19 DIAGNOSIS — S12000A Unspecified displaced fracture of first cervical vertebra, initial encounter for closed fracture: Secondary | ICD-10-CM | POA: Diagnosis not present

## 2014-12-19 DIAGNOSIS — S299XXA Unspecified injury of thorax, initial encounter: Secondary | ICD-10-CM | POA: Diagnosis not present

## 2014-12-19 DIAGNOSIS — J449 Chronic obstructive pulmonary disease, unspecified: Secondary | ICD-10-CM | POA: Diagnosis not present

## 2014-12-19 DIAGNOSIS — M47814 Spondylosis without myelopathy or radiculopathy, thoracic region: Secondary | ICD-10-CM | POA: Diagnosis not present

## 2014-12-19 DIAGNOSIS — S12200A Unspecified displaced fracture of third cervical vertebra, initial encounter for closed fracture: Secondary | ICD-10-CM | POA: Diagnosis not present

## 2014-12-19 DIAGNOSIS — S1183XA Puncture wound without foreign body of other specified part of neck, initial encounter: Secondary | ICD-10-CM | POA: Diagnosis not present

## 2014-12-19 DIAGNOSIS — S50812A Abrasion of left forearm, initial encounter: Secondary | ICD-10-CM | POA: Diagnosis not present

## 2014-12-19 DIAGNOSIS — W19XXXA Unspecified fall, initial encounter: Secondary | ICD-10-CM | POA: Diagnosis not present

## 2014-12-19 DIAGNOSIS — S3993XA Unspecified injury of pelvis, initial encounter: Secondary | ICD-10-CM | POA: Diagnosis not present

## 2014-12-19 DIAGNOSIS — Y998 Other external cause status: Secondary | ICD-10-CM | POA: Insufficient documentation

## 2014-12-19 DIAGNOSIS — S0191XA Laceration without foreign body of unspecified part of head, initial encounter: Secondary | ICD-10-CM | POA: Diagnosis not present

## 2014-12-19 DIAGNOSIS — S0180XA Unspecified open wound of other part of head, initial encounter: Secondary | ICD-10-CM | POA: Diagnosis not present

## 2014-12-19 DIAGNOSIS — Z792 Long term (current) use of antibiotics: Secondary | ICD-10-CM | POA: Diagnosis not present

## 2014-12-19 DIAGNOSIS — S3992XA Unspecified injury of lower back, initial encounter: Secondary | ICD-10-CM | POA: Diagnosis not present

## 2014-12-19 DIAGNOSIS — R531 Weakness: Secondary | ICD-10-CM | POA: Diagnosis not present

## 2014-12-19 LAB — BASIC METABOLIC PANEL
ANION GAP: 16 — AB (ref 5–15)
BUN: 23 mg/dL — ABNORMAL HIGH (ref 6–20)
CHLORIDE: 93 mmol/L — AB (ref 101–111)
CO2: 27 mmol/L (ref 22–32)
CREATININE: 1.29 mg/dL — AB (ref 0.44–1.00)
Calcium: 9 mg/dL (ref 8.9–10.3)
GFR calc non Af Amer: 44 mL/min — ABNORMAL LOW (ref >60)
GFR, EST AFRICAN AMERICAN: 51 mL/min — AB (ref >60)
Glucose, Bld: 89 mg/dL (ref 65–99)
Potassium: 2.7 mmol/L — CL (ref 3.5–5.1)
Sodium: 136 mmol/L (ref 135–145)

## 2014-12-19 LAB — CBC WITH DIFFERENTIAL/PLATELET
Basophils Absolute: 0.2 10*3/uL — ABNORMAL HIGH (ref 0–0.1)
Basophils Relative: 3 %
EOS ABS: 0.1 10*3/uL (ref 0–0.7)
HCT: 36.5 % (ref 35.0–47.0)
Hemoglobin: 12.6 g/dL (ref 12.0–16.0)
Lymphocytes Relative: 34 %
Lymphs Abs: 1.7 10*3/uL (ref 1.0–3.6)
MCH: 34.4 pg — AB (ref 26.0–34.0)
MCHC: 34.7 g/dL (ref 32.0–36.0)
MCV: 99.1 fL (ref 80.0–100.0)
MONO ABS: 0.3 10*3/uL (ref 0.2–0.9)
Monocytes Relative: 5 %
NEUTROS ABS: 2.8 10*3/uL (ref 1.4–6.5)
PLATELETS: 59 10*3/uL — AB (ref 150–440)
RBC: 3.68 MIL/uL — ABNORMAL LOW (ref 3.80–5.20)
RDW: 13.3 % (ref 11.5–14.5)
WBC: 5.1 10*3/uL (ref 3.6–11.0)

## 2014-12-19 LAB — ETHANOL: Alcohol, Ethyl (B): 333 mg/dL (ref <5)

## 2014-12-19 LAB — PROTIME-INR
INR: 1.21
PROTHROMBIN TIME: 15.5 s — AB (ref 11.4–15.0)

## 2014-12-19 MED ORDER — TETANUS-DIPHTHERIA TOXOIDS TD 5-2 LFU IM INJ: 0.5000 mL | INJECTION | Freq: Once | INTRAMUSCULAR | Status: DC

## 2014-12-19 MED ORDER — SODIUM CHLORIDE 0.9 % IV BOLUS (SEPSIS)
1000.0000 mL | Freq: Once | INTRAVENOUS | Status: AC
  Administered 2014-12-19: 1000 mL via INTRAVENOUS

## 2014-12-19 NOTE — ED Notes (Signed)
Report given to [facility] ED / Lonn Georgia RN.  [facility] transport arrived 08:05, report given. Safe transfer to stretcher. No changes in assessment.   After pt transferred, received call from lab for critical potassium. Dr. Archie Balboa updated. 12 lead EKG done. Pt NPO for possible surgical intervention and [facility] unable to transport pt on potassium drip. Ok to continue transfer.   8:25 AM [facility] , updated Kayla with potassium level and EKG

## 2014-12-19 NOTE — ED Notes (Signed)
Pt presents to ED via EMS for a fall. Pt and husband states pt fell while trying to go to bathroom and hit head on side stand. 7cm/1cm laceration noted on top left side of head. Bleeding controlled at this time. Pt denies LOC or pain. Staples placed by Dr. Joni Fears. Pt admits drinking tonight.

## 2014-12-19 NOTE — ED Provider Notes (Signed)
[facility] Emergency Department Provider Note  ____________________________________________  Time seen: 4:35 AM  I have reviewed the triage vital signs and the nursing notes.   HISTORY  Chief Complaint Fall  History Limited by patient's intoxication  HPI Stephanie Raymond is a 59 y.o. female is brought to the ED after a fall. She had been drinking alcohol tonight and was walking to the bathroom when she fell and hit the right side of her head on some furniture. Denies loss of consciousness, patient denies any pain whatsoever. She is moving all extremities moving her neck, brought in by EMS without any spinal precautions.     Past Medical History  Diagnosis Date  . Depression   . GERD (gastroesophageal reflux disease)   . Hypertension   . Hypothyroidism   . Hx of transient ischemic attack (TIA) 2000    Hosp ?  [facility] Inf[facility], female   . History of CT scan 04/1998    small lacunar infarcts  . History of MRI of brain and brain stem 07/1998    white matter changes  . Psoriasis     on humira  . 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 Cyst of left breast 08/31/2014  . Breast lump on left side at 12 o'clock position 05/31/2014  . Hepatic cirrhosis ([place]) 01/21/2014  . Gallstones 01/21/2014  . Encephalopathy, hepatic ([place]) 01/21/2014  . Hypokalemia 11/24/2013  . Head injury 11/24/2013  . Anemia, chronic disease 09/01/2013  . Thrombocytopenia ([place]) 09/01/2013  . 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 Neck muscle spasm 06/16/2013  . Traumatic hematoma of lower back 06/16/2013  . Wheezing 06/16/2013  . Fatigue 06/16/2013  . Calcification of left breast 10/11/2012  . Encounter for routine gynecological examination 09/30/2012  . Cirrhosis ([place]) 07/31/2011  . 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 Hemorrhoids 02/28/2011  . Smoker 12/01/2010  . Routine general medical  examination at a health care facility 11/30/2010  . Other screening mammogram 11/30/2010  . BACK PAIN, LUMBAR 02/04/2007  . HYPOTHYROIDISM 01/29/2007  . History of alcohol abuse 01/29/2007  . Adjustment disorder with mixed anxiety and depressed mood 01/29/2007  . HYPERTENSION 01/29/2007  . GERD 01/29/2007  . PSORIASIS 01/29/2007  . CARPAL TUNNEL SYNDROME, HX OF 01/29/2007  . TRANSIENT ISCHEMIC ATTACK, HX OF 01/29/2007     Past Surgical History  Procedure Laterality Date  . Cervical conization w/bx  1980's  . Carotid ultrasound  04/1998    neg  . Foot surgery      toe treated, as a child  . Cervical cone biopsy  02/1999    microinvasive cervical carcinoma  . Partial hysterectomy  02/1999  . Breast biopsy Left Oct 14 2012    Negative     Current Outpatient Rx  Name  Route  Sig  Dispense  Refill  . albuterol (PROVENTIL HFA;VENTOLIN HFA) 108 (90 BASE) MCG/ACT inhaler   Inhalation   Inhale 2 puffs into the lungs every 4 (four) hours as needed for wheezing.   3 Inhaler   1   . buPROPion (WELLBUTRIN SR) 150 MG 12 hr tablet      Take 2 tablets (300 mg  total) by mouth daily.   180 tablet   1   . Cholecalciferol (VITAMIN D-3 PO)   Oral   Take 1 capsule by mouth daily.          Marland Kitchen  furosemide (LASIX) 80 MG tablet      Take 1 tablet by mouth  daily.   90 tablet   1   . levothyroxine (SYNTHROID, LEVOTHROID) 50 MCG tablet      Take 1 tablet (50 mcg  total) by mouth daily  before breakfast.   90 tablet   1   . magnesium oxide (MAG-OX) 400 MG tablet   Oral   Take 800 mg by mouth 2 (two) times daily.         Marland Kitchen omeprazole (PRILOSEC) 20 MG capsule      Take 1 capsule (20 mg  total) by mouth daily.   90 capsule   1   . potassium chloride (KLOR-CON) 20 MEQ packet   Oral   Take 20 mEq by mouth daily.   90 packet   1   . cephALEXin (KEFLEX) 500 MG capsule   Oral   Take 500 mg by mouth 3 (three) times daily.         . chlordiazePOXIDE (LIBRIUM) 10 MG  capsule   Oral   Take 10 mg by mouth at bedtime.         . potassium chloride SA (K-DUR,KLOR-CON) 20 MEQ tablet   Oral   Take 1 tablet (20 mEq total) by mouth daily.   90 tablet   1      Allergies Review of patient's allergies indicates no known allergies.   Family History  Problem Relation Age of Onset  . Emphysema Mother     smoker  . Heart disease Father 73    CABG  . Heart disease Other     MI in 56s  . Heart disease Other     CHF  . Hypertension Other   . Colon cancer Neg Hx   . Colon polyps Neg Hx   . Stomach cancer Neg Hx   . Rectal cancer Neg Hx   . Breast cancer Other 86    Paternal    Social History Social History  Substance Use Topics  . Smoking status: Current Every Day Smoker -- 0.10 packs/day    Types: Cigarettes  . Smokeless tobacco: Never Used     Comment: 1-2 cigaretts a day  . Alcohol Use: No    Review of Systems  Constitutional:   No fever or chills. No weight changes Eyes:   No blurry vision or double vision.  ENT:   No sore throat. Cardiovascular:   No chest pain. Respiratory:   No dyspnea or cough. Gastrointestinal:   Negative for abdominal pain, vomiting and diarrhea.  No BRBPR or melena. Genitourinary:   Negative for dysuria, urinary retention, bloody urine, or difficulty urinating. Musculoskeletal:   Negative for back pain. No joint swelling or pain. Skin:   Negative for rash. Neurological:   Negative for headaches, focal weakness or numbness. Psychiatric:  No anxiety or depression.   Endocrine:  No hot/cold intolerance, changes in energy, or sleep difficulty.  10-point ROS otherwise negative.  ____________________________________________   PHYSICAL EXAM:  VITAL SIGNS: ED Triage Vitals  Enc Vitals Group     BP 12/19/14 0448 112/84 mmHg     Pulse Rate 12/19/14 0448 91     Resp 12/19/14 0448 15     Temp 12/19/14 0448 97.8 F (36.6 C)     Temp Source 12/19/14 0448 Oral     SpO2 12/19/14 0448 95 %     Weight  12/19/14 0452 150 lb (68.04 kg)     Height 12/19/14  8144 5\' 5"  (1.651 m)     Head Cir --      Peak Flow --      Pain Score 12/19/14 0449 0     Pain Loc --      Pain Edu? --      Excl. in [place]? --      Constitutional:   Alert, confused. Well appearing and in no distress. Eyes:   No scleral icterus. No conjunctival pallor. PERRL. EOMI ENT   Head:   Normocephalic with 5 cm laceration over the right parietal scalp with underlying hematoma formation. No hemotympanum.   Nose:   No congestion/rhinnorhea. No septal hematoma   Mouth/Throat:   MMM, no pharyngeal erythema. No peritonsillar mass. No uvula shift.   Neck:   No stridor. No SubQ emphysema. No meningismus. Hematological/Lymphatic/Immunilogical:   No cervical lymphadenopathy. Cardiovascular:   RRR. Normal and symmetric distal pulses are present in all extremities. No murmurs, rubs, or gallops. Respiratory:   Normal respiratory effort without tachypnea nor retractions. Breath sounds are clear and equal bilaterally. No wheezes/rales/rhonchi. Gastrointestinal:   Soft and nontender. No distention. There is no CVA tenderness.  No rebound, rigidity, or guarding. Genitourinary:   deferred Musculoskeletal:   Nontender with normal range of motion in all extremities. No joint effusions.  No lower extremity tenderness.  No edema. Neurologic:   Perseverating speech.  CN 2-10 normal. Motor grossly intact. Moves all extremities, large muscle groups all intact and symmetric No gross focal neurologic deficits are appreciated.  Skin:    Skin is warm, dry with scattered small abrasions on the lower extremities, hemostatic without lacerations. No rash noted.  No petechiae, purpura, or bullae.   ____________________________________________    LABS (pertinent positives/negatives) (all labs ordered are listed, but only abnormal results are displayed) Labs Reviewed  BASIC METABOLIC PANEL  ETHANOL  CBC WITH DIFFERENTIAL/PLATELET   PROTIME-INR   ____________________________________________   EKG    ____________________________________________    RADIOLOGY  CT head unremarkable except for right parietal scalp hematoma CT cervical spine shows comminuted fractures of C1 including the left anterior arch and bilateral posterior arch  ____________________________________________   PROCEDURES CRITICAL CARE Performed by: Joni Fears, Grafton Warzecha   Total critical care time: 35 minutes  Critical care time was exclusive of separately billable procedures and treating other patients.  Critical care was necessary to treat or prevent imminent or life-threatening deterioration.  Critical care was time spent personally by me on the following activities: development of treatment plan with patient and/or surrogate as well as nursing, discussions with consultants, evaluation of patient's response to treatment, examination of patient, obtaining history from patient or surrogate, ordering and performing treatments and interventions, ordering and review of laboratory studies, ordering and review of radiographic studies, pulse oximetry and re-evaluation of patient's condition.   LACERATION REPAIR Performed by: Carrie Mew Authorized by: Carrie Mew Consent: Verbal consent obtained. Risks and benefits: risks, benefits and alternatives were discussed Consent given by: patient Patient identity confirmed: provided demographic data Prepped and Draped in normal sterile fashion Wound explored  Laceration Location: Right parietal scalp  Laceration Length: 5cm  No Foreign Bodies seen or palpated  Anesthesia: None Irrigation method: syringe Amount of cleaning: standard  Skin closure: Staples   Number of sutures: 6   Technique: Interrupted   Patient tolerance: Patient tolerated the procedure well with no immediate complications.  ____________________________________________   INITIAL IMPRESSION / ASSESSMENT  AND PLAN / ED COURSE  Pertinent labs & imaging results that were available during  my care of the patient were reviewed by me and considered in my medical decision making (see chart for details).  Patient initially presented with what seemed to be isolated injury of the right parietal scalp. Due to altered mental status and concern for intracranial hemorrhage, this was rapidly stapled so that the patient could be CT right away. After obtaining CT head, I was informed by radiology at approximately 5:30 and there appeared to be C1 fractures. A cervical collar was merely placed, and C-spine CT was obtained.  This confirms that there is comminuted fracture of C1. Is able to find that the patient has previously had C1 fractures of bilateral posterior arches there are followed up with CT in July of this year and found to be stable, and the c-collar that she had been wearing daily for 12 weeks was then discontinued at that point in July. The finding of an anterior arch fracture is new. I discussed this with neurosurgery Dr. March Rummage this morning at 7:10 AM who recommends transfer to [facility] ED for further evaluation. I discussed with [facility] transfer [facility] regarding trauma transfer, and the patient was accepted per protocol on behalf of ED physician Dr. Uvaldo Bristle. Patient is medically stable, no neuro deficits. She does remain clinically intoxicated. Will be transferred with cervical spinal immobilization.     ____________________________________________   FINAL CLINICAL IMPRESSION(S) / ED DIAGNOSES  Final diagnoses:  Other closed displaced fracture of first cervical vertebra, initial encounter ([place])  Alcohol intoxication, with unspecified complication ([place])  Hematoma of right parietal scalp, initial encounter      Carrie Mew, MD 12/19/14 [id number]

## 2014-12-27 DIAGNOSIS — Z4802 Encounter for removal of sutures: Secondary | ICD-10-CM | POA: Diagnosis not present

## 2014-12-27 DIAGNOSIS — S0990XA Unspecified injury of head, initial encounter: Secondary | ICD-10-CM | POA: Diagnosis not present

## 2014-12-31 DIAGNOSIS — S12090D Other displaced fracture of first cervical vertebra, subsequent encounter for fracture with routine healing: Secondary | ICD-10-CM | POA: Diagnosis not present

## 2014-12-31 DIAGNOSIS — M4312 Spondylolisthesis, cervical region: Secondary | ICD-10-CM | POA: Diagnosis not present

## 2014-12-31 DIAGNOSIS — M47812 Spondylosis without myelopathy or radiculopathy, cervical region: Secondary | ICD-10-CM | POA: Diagnosis not present

## 2014-12-31 DIAGNOSIS — S12000D Unspecified displaced fracture of first cervical vertebra, subsequent encounter for fracture with routine healing: Secondary | ICD-10-CM | POA: Diagnosis not present

## 2014-12-31 DIAGNOSIS — S12000A Unspecified displaced fracture of first cervical vertebra, initial encounter for closed fracture: Secondary | ICD-10-CM | POA: Diagnosis not present

## 2015-01-11 DIAGNOSIS — L400 Psoriasis vulgaris: Secondary | ICD-10-CM | POA: Diagnosis not present

## 2015-01-13 DIAGNOSIS — L400 Psoriasis vulgaris: Secondary | ICD-10-CM | POA: Diagnosis not present

## 2015-02-01 DIAGNOSIS — L400 Psoriasis vulgaris: Secondary | ICD-10-CM | POA: Diagnosis not present

## 2015-02-04 DIAGNOSIS — S12000D Unspecified displaced fracture of first cervical vertebra, subsequent encounter for fracture with routine healing: Secondary | ICD-10-CM | POA: Diagnosis not present

## 2015-02-04 DIAGNOSIS — S12001D Unspecified nondisplaced fracture of first cervical vertebra, subsequent encounter for fracture with routine healing: Secondary | ICD-10-CM | POA: Diagnosis not present

## 2015-02-04 DIAGNOSIS — M50323 Other cervical disc degeneration at C6-C7 level: Secondary | ICD-10-CM | POA: Diagnosis not present

## 2015-02-04 DIAGNOSIS — S12300A Unspecified displaced fracture of fourth cervical vertebra, initial encounter for closed fracture: Secondary | ICD-10-CM | POA: Diagnosis not present

## 2015-02-04 DIAGNOSIS — M4312 Spondylolisthesis, cervical region: Secondary | ICD-10-CM | POA: Diagnosis not present

## 2015-02-04 DIAGNOSIS — S12200A Unspecified displaced fracture of third cervical vertebra, initial encounter for closed fracture: Secondary | ICD-10-CM | POA: Diagnosis not present

## 2015-02-04 DIAGNOSIS — S12400A Unspecified displaced fracture of fifth cervical vertebra, initial encounter for closed fracture: Secondary | ICD-10-CM | POA: Diagnosis not present

## 2015-02-09 ENCOUNTER — Telehealth: Payer: Self-pay | Admitting: Family Medicine

## 2015-02-09 DIAGNOSIS — Z0000 Encounter for general adult medical examination without abnormal findings: Secondary | ICD-10-CM

## 2015-02-09 NOTE — Telephone Encounter (Signed)
-----   Message from Ellamae Sia sent at 02/04/2015 10:08 AM EST ----- Regarding: Lab orders for Wednesday, 12.21.16 Patient is scheduled for CPX labs, please order future labs, Thanks , Karna Christmas

## 2015-02-10 ENCOUNTER — Other Ambulatory Visit: Payer: No Typology Code available for payment source

## 2015-02-17 ENCOUNTER — Encounter: Payer: No Typology Code available for payment source | Admitting: Family Medicine

## 2015-02-18 ENCOUNTER — Other Ambulatory Visit: Payer: Self-pay

## 2015-02-18 MED ORDER — ALBUTEROL SULFATE HFA 108 (90 BASE) MCG/ACT IN AERS: 2.0000 | INHALATION_SPRAY | RESPIRATORY_TRACT | Status: DC | PRN

## 2015-03-18 DIAGNOSIS — D696 Thrombocytopenia, unspecified: Secondary | ICD-10-CM | POA: Diagnosis not present

## 2015-03-18 DIAGNOSIS — M4312 Spondylolisthesis, cervical region: Secondary | ICD-10-CM | POA: Diagnosis not present

## 2015-03-18 DIAGNOSIS — K219 Gastro-esophageal reflux disease without esophagitis: Secondary | ICD-10-CM | POA: Diagnosis not present

## 2015-03-18 DIAGNOSIS — K649 Unspecified hemorrhoids: Secondary | ICD-10-CM | POA: Diagnosis not present

## 2015-03-18 DIAGNOSIS — D690 Allergic purpura: Secondary | ICD-10-CM | POA: Diagnosis not present

## 2015-03-18 DIAGNOSIS — E876 Hypokalemia: Secondary | ICD-10-CM | POA: Diagnosis not present

## 2015-03-18 DIAGNOSIS — S12030D Displaced posterior arch fracture of first cervical vertebra, subsequent encounter for fracture with routine healing: Secondary | ICD-10-CM | POA: Diagnosis not present

## 2015-03-18 DIAGNOSIS — I776 Arteritis, unspecified: Secondary | ICD-10-CM | POA: Diagnosis not present

## 2015-03-18 DIAGNOSIS — N179 Acute kidney failure, unspecified: Secondary | ICD-10-CM | POA: Diagnosis not present

## 2015-03-18 DIAGNOSIS — I129 Hypertensive chronic kidney disease with stage 1 through stage 4 chronic kidney disease, or unspecified chronic kidney disease: Secondary | ICD-10-CM | POA: Diagnosis not present

## 2015-03-18 DIAGNOSIS — S0990XD Unspecified injury of head, subsequent encounter: Secondary | ICD-10-CM | POA: Diagnosis not present

## 2015-03-18 DIAGNOSIS — M5033 Other cervical disc degeneration, cervicothoracic region: Secondary | ICD-10-CM | POA: Diagnosis not present

## 2015-03-18 DIAGNOSIS — E039 Hypothyroidism, unspecified: Secondary | ICD-10-CM | POA: Diagnosis not present

## 2015-03-18 DIAGNOSIS — D649 Anemia, unspecified: Secondary | ICD-10-CM | POA: Diagnosis not present

## 2015-03-18 DIAGNOSIS — N189 Chronic kidney disease, unspecified: Secondary | ICD-10-CM | POA: Diagnosis not present

## 2015-03-18 DIAGNOSIS — K8020 Calculus of gallbladder without cholecystitis without obstruction: Secondary | ICD-10-CM | POA: Diagnosis not present

## 2015-03-18 DIAGNOSIS — S12000D Unspecified displaced fracture of first cervical vertebra, subsequent encounter for fracture with routine healing: Secondary | ICD-10-CM | POA: Diagnosis not present

## 2015-03-18 DIAGNOSIS — K7290 Hepatic failure, unspecified without coma: Secondary | ICD-10-CM | POA: Diagnosis not present

## 2015-03-18 DIAGNOSIS — S12600D Unspecified displaced fracture of seventh cervical vertebra, subsequent encounter for fracture with routine healing: Secondary | ICD-10-CM | POA: Diagnosis not present

## 2015-03-18 DIAGNOSIS — M545 Low back pain: Secondary | ICD-10-CM | POA: Diagnosis not present

## 2015-03-18 DIAGNOSIS — G111 Early-onset cerebellar ataxia: Secondary | ICD-10-CM | POA: Diagnosis not present

## 2015-03-18 DIAGNOSIS — K7460 Unspecified cirrhosis of liver: Secondary | ICD-10-CM | POA: Diagnosis not present

## 2015-03-18 DIAGNOSIS — L409 Psoriasis, unspecified: Secondary | ICD-10-CM | POA: Diagnosis not present

## 2015-03-18 DIAGNOSIS — S12001D Unspecified nondisplaced fracture of first cervical vertebra, subsequent encounter for fracture with routine healing: Secondary | ICD-10-CM | POA: Diagnosis not present

## 2015-03-18 DIAGNOSIS — R161 Splenomegaly, not elsewhere classified: Secondary | ICD-10-CM | POA: Diagnosis not present

## 2015-03-28 ENCOUNTER — Other Ambulatory Visit: Payer: Self-pay | Admitting: Family Medicine

## 2015-07-13 DIAGNOSIS — L400 Psoriasis vulgaris: Secondary | ICD-10-CM | POA: Diagnosis not present

## 2015-07-13 DIAGNOSIS — Z79899 Other long term (current) drug therapy: Secondary | ICD-10-CM | POA: Diagnosis not present

## 2015-09-20 ENCOUNTER — Telehealth: Payer: Self-pay | Admitting: Family Medicine

## 2015-09-20 ENCOUNTER — Ambulatory Visit (INDEPENDENT_AMBULATORY_CARE_PROVIDER_SITE_OTHER): Payer: Medicare Other | Admitting: Family Medicine

## 2015-09-20 ENCOUNTER — Encounter: Payer: Self-pay | Admitting: Family Medicine

## 2015-09-20 VITALS — BP 130/80 | HR 106 | Temp 98.1°F | Ht 62.25 in | Wt 157.5 lb

## 2015-09-20 DIAGNOSIS — I10 Essential (primary) hypertension: Secondary | ICD-10-CM | POA: Diagnosis not present

## 2015-09-20 DIAGNOSIS — Z720 Tobacco use: Secondary | ICD-10-CM

## 2015-09-20 DIAGNOSIS — N63 Unspecified lump in breast: Secondary | ICD-10-CM

## 2015-09-20 DIAGNOSIS — G119 Hereditary ataxia, unspecified: Secondary | ICD-10-CM

## 2015-09-20 DIAGNOSIS — D690 Allergic purpura: Secondary | ICD-10-CM

## 2015-09-20 DIAGNOSIS — F4323 Adjustment disorder with mixed anxiety and depressed mood: Secondary | ICD-10-CM | POA: Diagnosis not present

## 2015-09-20 DIAGNOSIS — F1010 Alcohol abuse, uncomplicated: Secondary | ICD-10-CM

## 2015-09-20 DIAGNOSIS — R5382 Chronic fatigue, unspecified: Secondary | ICD-10-CM

## 2015-09-20 DIAGNOSIS — K219 Gastro-esophageal reflux disease without esophagitis: Secondary | ICD-10-CM

## 2015-09-20 DIAGNOSIS — E039 Hypothyroidism, unspecified: Secondary | ICD-10-CM

## 2015-09-20 DIAGNOSIS — D696 Thrombocytopenia, unspecified: Secondary | ICD-10-CM

## 2015-09-20 DIAGNOSIS — N6325 Unspecified lump in the left breast, overlapping quadrants: Secondary | ICD-10-CM

## 2015-09-20 DIAGNOSIS — K7030 Alcoholic cirrhosis of liver without ascites: Secondary | ICD-10-CM

## 2015-09-20 DIAGNOSIS — F1011 Alcohol abuse, in remission: Secondary | ICD-10-CM

## 2015-09-20 DIAGNOSIS — Z0000 Encounter for general adult medical examination without abnormal findings: Secondary | ICD-10-CM | POA: Diagnosis not present

## 2015-09-20 DIAGNOSIS — E876 Hypokalemia: Secondary | ICD-10-CM | POA: Diagnosis not present

## 2015-09-20 DIAGNOSIS — F17200 Nicotine dependence, unspecified, uncomplicated: Secondary | ICD-10-CM

## 2015-09-20 DIAGNOSIS — L408 Other psoriasis: Secondary | ICD-10-CM

## 2015-09-20 DIAGNOSIS — N6320 Unspecified lump in the left breast, unspecified quadrant: Secondary | ICD-10-CM

## 2015-09-20 LAB — COMPREHENSIVE METABOLIC PANEL
ALBUMIN: 4.1 g/dL (ref 3.5–5.2)
ALK PHOS: 59 U/L (ref 39–117)
ALT: 15 U/L (ref 0–35)
AST: 43 U/L — AB (ref 0–37)
BILIRUBIN TOTAL: 1.2 mg/dL (ref 0.2–1.2)
BUN: 16 mg/dL (ref 6–23)
CO2: 32 mEq/L (ref 19–32)
CREATININE: 0.91 mg/dL (ref 0.40–1.20)
Calcium: 9.8 mg/dL (ref 8.4–10.5)
Chloride: 96 mEq/L (ref 96–112)
GFR: 67.07 mL/min (ref >60.00)
Glucose, Bld: 97 mg/dL (ref 70–99)
Potassium: 4.2 mEq/L (ref 3.5–5.1)
SODIUM: 137 meq/L (ref 135–145)
TOTAL PROTEIN: 8.3 g/dL (ref 6.0–8.3)

## 2015-09-20 LAB — TSH: TSH: 3.96 u[IU]/mL (ref 0.35–4.50)

## 2015-09-20 LAB — CBC WITH DIFFERENTIAL/PLATELET
BASOS ABS: 0 10*3/uL (ref 0.0–0.1)
Basophils Relative: 0.4 % (ref 0.0–3.0)
EOS ABS: 0.1 10*3/uL (ref 0.0–0.7)
Eosinophils Relative: 2 % (ref 0.0–5.0)
HEMATOCRIT: 36.8 % (ref 36.0–46.0)
HEMOGLOBIN: 12.5 g/dL (ref 12.0–15.0)
LYMPHS PCT: 19.9 % (ref 12.0–46.0)
Lymphs Abs: 1 10*3/uL (ref 0.7–4.0)
MCHC: 34 g/dL (ref 30.0–36.0)
MCV: 98.6 fl (ref 78.0–100.0)
Monocytes Absolute: 0.4 10*3/uL (ref 0.1–1.0)
Monocytes Relative: 7.7 % (ref 3.0–12.0)
NEUTROS ABS: 3.5 10*3/uL (ref 1.4–7.7)
Neutrophils Relative %: 70 % (ref 43.0–77.0)
Platelets: 58 10*3/uL — ABNORMAL LOW (ref 150.0–400.0)
RBC: 3.73 Mil/uL — AB (ref 3.87–5.11)
RDW: 14.8 % (ref 11.5–15.5)
WBC: 5.1 10*3/uL (ref 4.0–10.5)

## 2015-09-20 LAB — LIPID PANEL
Cholesterol: 138 mg/dL (ref 0–200)
HDL: 58.3 mg/dL (ref >39.00)
LDL Cholesterol: 64 mg/dL (ref 0–99)
NONHDL: 79.93
Total CHOL/HDL Ratio: 2
Triglycerides: 78 mg/dL (ref 0.0–149.0)
VLDL: 15.6 mg/dL (ref 0.0–40.0)

## 2015-09-20 MED ORDER — LEVOTHYROXINE SODIUM 50 MCG PO TABS: ORAL_TABLET | ORAL | 3 refills | Status: DC

## 2015-09-20 MED ORDER — BUPROPION HCL ER (XL) 300 MG PO TB24: 300.0000 mg | ORAL_TABLET | Freq: Every day | ORAL | 3 refills | Status: DC

## 2015-09-20 MED ORDER — POTASSIUM CHLORIDE CRYS ER 20 MEQ PO TBCR: 10.0000 meq | EXTENDED_RELEASE_TABLET | Freq: Two times a day (BID) | ORAL | 3 refills | Status: DC

## 2015-09-20 MED ORDER — FUROSEMIDE 80 MG PO TABS: 80.0000 mg | ORAL_TABLET | Freq: Every day | ORAL | 2 refills | Status: DC

## 2015-09-20 MED ORDER — OMEPRAZOLE 20 MG PO CPDR: DELAYED_RELEASE_CAPSULE | ORAL | 3 refills | Status: DC

## 2015-09-20 NOTE — Assessment & Plan Note (Signed)
Sees dermatology On new drug _? Concentrix  Doing better  In program where she does not have to pay for it  They do watch labs

## 2015-09-20 NOTE — Assessment & Plan Note (Signed)
No c/o  No symptoms of esoph varicies  Lab today for liver tests with chemistries Pt states she still drinks etoh-not as much  She is not ready to quit at this time Voices understanding of need to quit for liver health

## 2015-09-20 NOTE — Assessment & Plan Note (Signed)
Worse lately with death of a grand daughter to OD and other stressors  Will inc her wellbutrin to 300 mg XL daily  Update if side eff Update if not starting to improve in a month or if worsening   Denies SI Declines counseling (due to finances) Reviewed stressors/ coping techniques/symptoms/ support sources/ tx options and side effects in detail today Enc not to drink etoh

## 2015-09-20 NOTE — Assessment & Plan Note (Signed)
Ref for Dx mm and [place]

## 2015-09-20 NOTE — Assessment & Plan Note (Signed)
Disc in detail risks of smoking and possible outcomes including copd, vascular/ heart disease, cancer , respiratory and sinus infections  Pt voices understanding She smoked 3 cig per day-cutting down Has copd  Using albuterol -will get a spacer May need more aggressive tx for copd in the future

## 2015-09-20 NOTE — Telephone Encounter (Signed)
Potassium level is ok - will send refill to her pharmacy (I wrote for 1/2 tab twice daily because she cannot swallow the large pill)   along with other meds needed  Platelet count is still low - suspect related to cirrhosis - and alcohol intake  Also one liver test is mildly high  Other labs are ok

## 2015-09-20 NOTE — Assessment & Plan Note (Signed)
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 If spelled correctly)- and is doing much better Gets labs periodically for that

## 2015-09-20 NOTE — Assessment & Plan Note (Deleted)
Disc in detail risks of smoking and possible outcomes including copd, vascular/ heart disease, cancer , respiratory and sinus infections  Pt voices understanding Has cut down Has symptoms of copd (plans on getting a chamber for her albuterol)-may need more aggressive tx Despite this states she is not ready to quit

## 2015-09-20 NOTE — Assessment & Plan Note (Signed)
No change  Last fall in the fall - with CS injury thought to be related to etoh as well Again enc to quit drinking etoh

## 2015-09-20 NOTE — Progress Notes (Signed)
Subjective:    Patient ID: Stephanie Raymond, female    DOB: 11-Feb-1956, 60 y.o.   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  No insurance now - [employer] did away with her retirement plan  Feels up to working  A lot more stress going on   Does have medicare part A and part B   Wt Readings from Last 3 Encounters:  09/20/15 157 lb 8 oz (71.4 kg)  12/19/14 150 lb (68 kg)  01/21/14 164 lb 12 oz (74.7 kg)   bmi is 28.5 Is eating regularly  Eating healthy   Cannot get much exercise - chronic ataxia (has had neck fractures-at risk for falls)  Does get out of the house most days - the store for short periods / family does try to get her out   HIV screening -neg 4/13  Td 6/03 Wants to get a tetanus shot = will look at the handout   Mammogram 5/16- had lump with 3 mo [place] recommended Has had a- neg bx in the past (fibrocystic) Has not had it yet  Goes [facility]   Seeing derm Hx of HSP Doing much much better ! On Concentix injection - 2 inj once per mo (through a program)    Pap 8/14 Has had hysterectomy in 2000  No pelvic symptoms   Colonoscopy 7/13-nl with 10 y recall   Hep C screen 6/13-she had pos ab but then Quantitative test was negative   dexa 8/14-bone density in nl range Has had fractures (CS) Has had falls   Due for labs  Hx of low K  Lab Results  Component Value Date   K 2.7 (LL) 12/19/2014   the K she has - pills are too big to swallow so she is too consistent  This was in ED  Hx of ETOH abuse with cirrhosis  Still drinking alcohol- on again and off again  Wants to stop  AA "is not for me"  Cannot afford private care or counseling at all  Thinks if we could help the depression   Smoking status - smokes about 3 cig per day  Not ready to quit yet at this point due to stress  She  does notice a smokers cough She uses albuterol when needed  Has copd    bp is elevated here  No cp or palpitations or headaches or edema  No side effects to medicines  BP Readings from Last 3 Encounters:  09/20/15 (!) 148/86  12/19/14 128/86  01/21/14 118/76     Hypothyroidism - due for re check  Pt has no clinical changes No change in energy level/ hair or skin/ edema and no tremor Lab Results  Component Value Date   TSH 0.969 12/25/2013     For mood currently on wellbutrin  150 bid Both anxious and depressed at times   Update family hx  Mother had AAA- died (at 57)  71 M also had AAA -had surgery Pt had abd [place] with no aneurysm in 2015    Patient Active Problem List   Diagnosis Date Noted  . Cyst of left breast 08/31/2014  . Breast lump on left side at 12 o'clock position 05/31/2014  . Hepatic cirrhosis ([place]) 01/21/2014  . Gallstones 01/21/2014  . Encephalopathy, hepatic ([place]) 01/21/2014  .  Hypokalemia 11/24/2013  . Head injury 11/24/2013  . Anemia, chronic disease 09/01/2013  . Thrombocytopenia ([place]) 09/01/2013  . 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 Neck muscle spasm 06/16/2013  . Traumatic hematoma of lower back 06/16/2013  . Wheezing 06/16/2013  . Fatigue 06/16/2013  . Calcification of left breast 10/11/2012  . Encounter for routine gynecological examination 09/30/2012  . Cirrhosis ([place]) 07/31/2011  . 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 Hemorrhoids 02/28/2011  . Smoker 12/01/2010  . Routine general medical examination at a health care facility 11/30/2010  . Other screening mammogram 11/30/2010  . BACK PAIN, LUMBAR 02/04/2007  . Hypothyroidism 01/29/2007  . History of alcohol abuse 01/29/2007  . Adjustment disorder with mixed anxiety and depressed mood 01/29/2007  . Essential hypertension 01/29/2007  . GERD 01/29/2007  . PSORIASIS 01/29/2007  . CARPAL TUNNEL SYNDROME, HX OF 01/29/2007  . TRANSIENT ISCHEMIC ATTACK, HX OF 01/29/2007     Past Medical History:  Diagnosis Date  . Depression   . GERD (gastroesophageal reflux disease)   . History of CT scan 04/1998   small lacunar infarcts  . History of MRI of brain and brain stem 07/1998   white matter changes  . Hx of transient ischemic attack (TIA) 2000   Hosp ?  Marland Kitchen Hypertension   . Hypothyroidism   . Infertility, female   . Psoriasis    on humira  . Vasculitis ([place])    h/o Henoch-Scholein Purpura   Past Surgical History:  Procedure Laterality Date  . BREAST BIOPSY Left Oct 14 2012   Negative  . carotid ultrasound  04/1998   neg  . CERVICAL CONE BIOPSY  02/1999   microinvasive cervical carcinoma  . CERVICAL CONIZATION W/BX  1980's  . FOOT SURGERY     toe treated, as a child  . PARTIAL HYSTERECTOMY  02/1999   Social History  Substance Use Topics  . Smoking status: Current Every Day Smoker    Packs/day: 0.10    Types: Cigarettes  . Smokeless tobacco: Never Used     Comment: 1-2 cigaretts a day  . Alcohol use No   Family History  Problem Relation Age of Onset  . Breast cancer Other 52    Paternal  . Emphysema Mother     smoker  . AAA (abdominal aortic aneurysm) Mother   . Heart disease Father 72    CABG  . Heart disease Other     MI in 36s  . Heart disease Other     CHF  . Hypertension Other   . AAA (abdominal aortic aneurysm) Maternal Grandmother   . Colon cancer Neg Hx   . Colon polyps Neg Hx   . Stomach cancer Neg Hx   . Rectal cancer Neg Hx    No Known Allergies Current Outpatient Prescriptions on File Prior to Visit  Medication Sig Dispense Refill  . albuterol (PROVENTIL HFA;VENTOLIN HFA) 108 (90 Base) MCG/ACT inhaler Inhale 2 puffs into the lungs every 4 (four) hours as needed for wheezing. 3 Inhaler 1  . Cholecalciferol (VITAMIN D-3 PO) Take 1 capsule by mouth daily.     . furosemide (LASIX) 80 MG tablet Take 1 tablet by mouth  daily 90 tablet 1  . levothyroxine (SYNTHROID, LEVOTHROID) 50 MCG tablet Take 1 tablet by mouth  daily  before breakfast 90 tablet 1  . magnesium oxide (MAG-OX) 400 MG tablet Take 800 mg by mouth 2 (two) times daily.    Marland Kitchen omeprazole (PRILOSEC) 20 MG capsule Take 1 capsule  by mouth  every day 90 capsule 1  . potassium chloride (KLOR-CON) 20 MEQ packet Take 20 mEq by mouth daily. 90 packet 1  . potassium chloride SA (K-DUR,KLOR-CON) 20 MEQ tablet Take 1 tablet by mouth  daily 90 tablet 1   No current facility-administered medications on file prior to visit.     Review of Systems Review of Systems  Constitutional: Negative for fever, appetite change, and unexpected weight change.  Eyes: Negative for pain and visual disturbance.  Respiratory: pos for baseline sob from copd and smokers cough   Cardiovascular: Negative for cp or palpitations    Gastrointestinal: Negative for nausea, diarrhea and constipation.  Genitourinary: Negative for urgency and frequency.  Skin: Negative for pallor or rash  (skin is improved) Neurological: Negative for weakness, light-headedness, numbness and headaches. pos for significant ataxia  Hematological: Negative for adenopathy. Does not bruise/bleed easily.  Psychiatric/Behavioral: pos for symptoms of anxiety and depression w/o SI         Objective:   Physical Exam  Constitutional: She appears well-developed and well-nourished. No distress.  overwt and chronically ill appearing   HENT:  Head: Normocephalic and atraumatic.  Right Ear: External ear normal.  Left Ear: External ear normal.  Mouth/Throat: Oropharynx is clear and moist.  Eyes: Conjunctivae and EOM are normal. Pupils are equal, round, and reactive to light. No scleral icterus.  Neck: Normal range of motion. Neck supple. No JVD present. Carotid bruit is not present. No thyromegaly present.  Cardiovascular: Normal rate, regular rhythm, normal heart sounds and intact distal pulses.  Exam reveals no gallop.   Pulmonary/Chest: Effort normal. No respiratory distress. She has wheezes. She exhibits no  tenderness.  Scant wheeze on forced exp only  Diffusely distant bs  occ dry cough  Abdominal: Soft. Bowel sounds are normal. She exhibits no distension, no abdominal bruit and no mass. There is hepatomegaly. There is no tenderness.  Notable hepatomegaly w/o tenderness  Genitourinary: No breast swelling, tenderness, discharge or bleeding.  Genitourinary Comments: Breast exam: No mass, nodules, thickening, tenderness, bulging, retraction, inflamation, nipple discharge or skin changes noted.  No axillary or clavicular LA.     The lump referred to in her last mammogram was not detected on today's exam  Musculoskeletal: Normal range of motion. She exhibits no edema or tenderness.  Lymphadenopathy:    She has no cervical adenopathy.  Neurological: She is alert. She has normal reflexes. She displays tremor. No cranial nerve deficit. Coordination abnormal.  Baseline tremor of hands   Skin: Skin is warm and dry. No rash noted. No erythema. No pallor.  Psychiatric: Her speech is normal and behavior is normal. Thought content normal. Her mood appears anxious. Her affect is not blunt, not labile and not inappropriate. She is not agitated, not aggressive, not slowed and not withdrawn. Thought content is not paranoid. She exhibits a depressed mood. She expresses no homicidal and no suicidal ideation.          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 If spelled correctly)- and is doing much better Gets labs periodically for that       Essential hypertension - Primary    Better on 2nd check Some whitecoat component (nervous when she presents)- per pt better bp at home bp  in fair control at this time  BP Readings from Last 1 Encounters:  09/20/15 130/80   No changes needed Disc lifstyle change with low  sodium diet and exercise        Relevant Orders   CBC with Differential/Platelet   Comprehensive metabolic panel   TSH   Lipid panel     Digestive   GERD    Still on prilosec which helps this or any gastritis symptoms  In light of hx of alcohol abuse-will continue this       Cirrhosis ([place])    No c/o  No symptoms of esoph varicies  Lab today for liver tests with chemistries Pt states she still drinks etoh-not as much  She is not ready to quit at this time Voices understanding of need to quit for liver health          Endocrine   Hypothyroidism    No clinical changes Takes levothyroxine 50 mcg daily  Lab today      Relevant Orders   TSH     Nervous and Auditory   Cerebellar ataxia ([facility])    No change  Last fall in the fall - with CS injury thought to be related to etoh as well Again enc to quit drinking etoh        Musculoskeletal and Integument   PSORIASIS    Sees dermatology On new drug _? Concentrix  Doing better  In program where she does not have to pay for it  They do watch labs         Other   Thrombocytopenia ([place])    Cbc today Pt continues to drink etoh and has hx of cirrhosis      Smoker    Disc in detail risks of smoking and possible outcomes including copd, vascular/ heart disease, cancer , respiratory and sinus infections  Pt voices understanding She smoked 3 cig per day-cutting down Has copd  Using albuterol -will get a spacer May need more aggressive tx for copd in the future       Routine general medical examination at a health care facility (Chronic)    Reviewed health habits including diet and exercise and skin cancer prevention Reviewed appropriate screening tests for age  Also reviewed health mt list, fam hx and immunization status , as well as social and family history   See HPI Labs today for wellness  Chronic medical problems reviewed  Enc to quit etoh and smoking Ref made for mammogram       Hypokalemia    Lab  today  Pt is not compliant with K due to large cap size She cannot afford packets ? If she could cut pill into pc  Pending labs first       Relevant Orders   Comprehensive metabolic panel   History of alcohol abuse    Pt still drinks intermittently-per pt not to excess and not daily  Understands relationship to her cirrhosis and falls and ataxia  She is not ready to quit Declines counseling or a program stating she cannot afford it       Fatigue   Breast lump on left side at 12 o'clock position    Ref for Dx mm and [place]      Relevant Orders   MM Digital Diagnostic Bilat   US BREAST LTD UNI LEFT INC AXILLA   Adjustment disorder with mixed anxiety and depressed mood    Worse lately with death of a grand daughter  to OD and other stressors  Will inc her wellbutrin to 300 mg XL daily  Update if side eff Update if not starting to improve in a month or if worsening   Denies SI Declines counseling (due to finances) Reviewed stressors/ coping techniques/symptoms/ support sources/ tx options and side effects in detail today Enc not to drink etoh         Other Visit Diagnoses   None.

## 2015-09-20 NOTE — Assessment & Plan Note (Signed)
Reviewed health habits including diet and exercise and skin cancer prevention Reviewed appropriate screening tests for age  Also reviewed health mt list, fam hx and immunization status , as well as social and family history   See HPI Labs today for wellness  Chronic medical problems reviewed  Enc to quit etoh and smoking Ref made for mammogram

## 2015-09-20 NOTE — Assessment & Plan Note (Signed)
Cbc today Pt continues to drink etoh and has hx of cirrhosis

## 2015-09-20 NOTE — Assessment & Plan Note (Signed)
Lab today  Pt is not compliant with K due to large cap size She cannot afford packets ? If she could cut pill into pc  Pending labs first

## 2015-09-20 NOTE — Assessment & Plan Note (Signed)
No clinical changes Takes levothyroxine 50 mcg daily  Lab today

## 2015-09-20 NOTE — Progress Notes (Signed)
Pre visit review using our clinic review tool, if applicable. No additional management support is needed unless otherwise documented below in the visit note. 

## 2015-09-20 NOTE — Assessment & Plan Note (Signed)
Pt still drinks intermittently-per pt not to excess and not daily  Understands relationship to her cirrhosis and falls and ataxia  She is not ready to quit Declines counseling or a program stating she cannot afford it

## 2015-09-20 NOTE — Assessment & Plan Note (Signed)
Still on prilosec which helps this or any gastritis symptoms  In light of hx of alcohol abuse-will continue this

## 2015-09-20 NOTE — Assessment & Plan Note (Signed)
Better on 2nd check Some whitecoat component (nervous when she presents)- per pt better bp at home bp in fair control at this time  BP Readings from Last 1 Encounters:  09/20/15 130/80   No changes needed Disc lifstyle change with low sodium diet and exercise

## 2015-09-20 NOTE — Patient Instructions (Addendum)
Ask the pharmacist about a chamber for your albuterol  Increase your wellbutrin to 300 mg twice daily until you run out and then start the 300 mg XL once daily  Update me if any problems  If no improvement in a month let me know  Labs today  Keep trying to quit alcohol and smoking (when you are ready)   Stop at check out for mammogram referral

## 2015-09-21 NOTE — Telephone Encounter (Signed)
Left voicemail requesting pt to call office back 

## 2015-09-22 ENCOUNTER — Telehealth: Payer: Self-pay | Admitting: Family Medicine

## 2015-09-22 ENCOUNTER — Other Ambulatory Visit: Payer: Self-pay | Admitting: Family Medicine

## 2015-09-22 DIAGNOSIS — Z87898 Personal history of other specified conditions: Secondary | ICD-10-CM

## 2015-09-22 NOTE — Telephone Encounter (Signed)
[place] needs uni right  Butch Penny will put order in for me

## 2015-09-27 NOTE — Telephone Encounter (Signed)
Left 2nd voicemail requesting pt to cal office back

## 2015-09-28 NOTE — Telephone Encounter (Signed)
Pt notified of Dr. Marliss Coots comments and verbalized understanding pt will try taking 1/2 tab of the K because they are large pills

## 2015-09-28 NOTE — Telephone Encounter (Signed)
Patient returned Shapale's call.  Please call patient back at [phone].

## 2015-10-12 ENCOUNTER — Telehealth: Payer: Self-pay

## 2015-10-12 MED ORDER — SPACER/AERO CHAMBER MOUTHPIECE MISC: 0 refills | Status: DC

## 2015-10-12 NOTE — Telephone Encounter (Signed)
I have mailed the rx to the pt

## 2015-10-12 NOTE — Telephone Encounter (Signed)
Pt left v/m; pt was seen 09/20/15; pt request printed rx for chamber (? Spacer) for albuterol; pharmacy will not send without rx. Pt request rx mailed to her home address.Please advise.

## 2015-10-12 NOTE — Telephone Encounter (Signed)
Printed and in IN box to mail

## 2015-10-13 ENCOUNTER — Ambulatory Visit
Admission: RE | Admit: 2015-10-13 | Discharge: 2015-10-13 | Disposition: A | Discharge status: Home / Self Care | Payer: Medicare Other | Source: Ambulatory Visit | Attending: Family Medicine | Admitting: Family Medicine

## 2015-10-13 ENCOUNTER — Other Ambulatory Visit: Payer: Self-pay | Admitting: Family Medicine

## 2015-10-13 DIAGNOSIS — R928 Other abnormal and inconclusive findings on diagnostic imaging of breast: Secondary | ICD-10-CM | POA: Diagnosis not present

## 2015-10-13 DIAGNOSIS — N6325 Unspecified lump in the left breast, overlapping quadrants: Secondary | ICD-10-CM

## 2015-10-13 DIAGNOSIS — N63 Unspecified lump in breast: Secondary | ICD-10-CM | POA: Diagnosis present

## 2015-10-13 DIAGNOSIS — N6320 Unspecified lump in the left breast, unspecified quadrant: Principal | ICD-10-CM

## 2015-10-13 DIAGNOSIS — Z87898 Personal history of other specified conditions: Secondary | ICD-10-CM

## 2015-10-13 LAB — HM MAMMOGRAPHY

## 2015-10-14 ENCOUNTER — Encounter: Payer: Self-pay | Admitting: *Deleted

## 2015-12-13 DIAGNOSIS — H2513 Age-related nuclear cataract, bilateral: Secondary | ICD-10-CM | POA: Diagnosis not present

## 2016-01-06 DIAGNOSIS — Z79899 Other long term (current) drug therapy: Secondary | ICD-10-CM | POA: Diagnosis not present

## 2016-01-06 DIAGNOSIS — L400 Psoriasis vulgaris: Secondary | ICD-10-CM | POA: Diagnosis not present

## 2016-01-19 DIAGNOSIS — H25013 Cortical age-related cataract, bilateral: Secondary | ICD-10-CM | POA: Diagnosis not present

## 2016-01-19 DIAGNOSIS — H35372 Puckering of macula, left eye: Secondary | ICD-10-CM | POA: Diagnosis not present

## 2016-01-19 DIAGNOSIS — H40013 Open angle with borderline findings, low risk, bilateral: Secondary | ICD-10-CM | POA: Diagnosis not present

## 2016-01-19 DIAGNOSIS — H2513 Age-related nuclear cataract, bilateral: Secondary | ICD-10-CM | POA: Diagnosis not present

## 2016-01-19 DIAGNOSIS — H25012 Cortical age-related cataract, left eye: Secondary | ICD-10-CM | POA: Diagnosis not present

## 2016-01-19 DIAGNOSIS — H35032 Hypertensive retinopathy, left eye: Secondary | ICD-10-CM | POA: Diagnosis not present

## 2016-02-04 DIAGNOSIS — L400 Psoriasis vulgaris: Secondary | ICD-10-CM | POA: Diagnosis not present

## 2016-02-07 DIAGNOSIS — M1812 Unilateral primary osteoarthritis of first carpometacarpal joint, left hand: Secondary | ICD-10-CM | POA: Diagnosis not present

## 2016-02-07 DIAGNOSIS — M20012 Mallet finger of left finger(s): Secondary | ICD-10-CM | POA: Diagnosis not present

## 2016-02-10 ENCOUNTER — Encounter (INDEPENDENT_AMBULATORY_CARE_PROVIDER_SITE_OTHER): Payer: Medicare Other | Admitting: Ophthalmology

## 2016-02-10 DIAGNOSIS — H35372 Puckering of macula, left eye: Secondary | ICD-10-CM | POA: Diagnosis not present

## 2016-02-10 DIAGNOSIS — H43813 Vitreous degeneration, bilateral: Secondary | ICD-10-CM

## 2016-02-10 DIAGNOSIS — H4602 Optic papillitis, left eye: Secondary | ICD-10-CM | POA: Diagnosis not present

## 2016-02-10 DIAGNOSIS — H353111 Nonexudative age-related macular degeneration, right eye, early dry stage: Secondary | ICD-10-CM

## 2016-03-07 DIAGNOSIS — R69 Illness, unspecified: Secondary | ICD-10-CM | POA: Diagnosis not present

## 2016-03-27 DIAGNOSIS — H2513 Age-related nuclear cataract, bilateral: Secondary | ICD-10-CM | POA: Diagnosis not present

## 2016-03-27 DIAGNOSIS — H3581 Retinal edema: Secondary | ICD-10-CM | POA: Diagnosis not present

## 2016-03-27 DIAGNOSIS — H30033 Focal chorioretinal inflammation, peripheral, bilateral: Secondary | ICD-10-CM | POA: Diagnosis not present

## 2016-04-06 DIAGNOSIS — R2240 Localized swelling, mass and lump, unspecified lower limb: Secondary | ICD-10-CM | POA: Diagnosis not present

## 2016-04-20 DIAGNOSIS — H30033 Focal chorioretinal inflammation, peripheral, bilateral: Secondary | ICD-10-CM | POA: Diagnosis not present

## 2016-04-20 DIAGNOSIS — H3581 Retinal edema: Secondary | ICD-10-CM | POA: Diagnosis not present

## 2016-04-20 DIAGNOSIS — H2513 Age-related nuclear cataract, bilateral: Secondary | ICD-10-CM | POA: Diagnosis not present

## 2016-05-04 ENCOUNTER — Telehealth: Payer: Self-pay | Admitting: Family Medicine

## 2016-05-04 NOTE — Telephone Encounter (Signed)
Left pt message asking to call Allison back directly at [phone] to schedule AWV.+ labs with Lesia and CPE with PCP. °

## 2016-05-23 DIAGNOSIS — H30033 Focal chorioretinal inflammation, peripheral, bilateral: Secondary | ICD-10-CM | POA: Diagnosis not present

## 2016-05-23 DIAGNOSIS — H2513 Age-related nuclear cataract, bilateral: Secondary | ICD-10-CM | POA: Diagnosis not present

## 2016-05-23 DIAGNOSIS — H3581 Retinal edema: Secondary | ICD-10-CM | POA: Diagnosis not present

## 2016-06-21 NOTE — Telephone Encounter (Signed)
Scheduled 09/20/16

## 2016-06-27 DIAGNOSIS — H2513 Age-related nuclear cataract, bilateral: Secondary | ICD-10-CM | POA: Diagnosis not present

## 2016-06-27 DIAGNOSIS — H30033 Focal chorioretinal inflammation, peripheral, bilateral: Secondary | ICD-10-CM | POA: Diagnosis not present

## 2016-06-27 DIAGNOSIS — H3581 Retinal edema: Secondary | ICD-10-CM | POA: Diagnosis not present

## 2016-07-05 DIAGNOSIS — H30033 Focal chorioretinal inflammation, peripheral, bilateral: Secondary | ICD-10-CM | POA: Diagnosis not present

## 2016-07-05 DIAGNOSIS — H3581 Retinal edema: Secondary | ICD-10-CM | POA: Diagnosis not present

## 2016-09-05 DIAGNOSIS — H30033 Focal chorioretinal inflammation, peripheral, bilateral: Secondary | ICD-10-CM | POA: Diagnosis not present

## 2016-09-05 DIAGNOSIS — H2513 Age-related nuclear cataract, bilateral: Secondary | ICD-10-CM | POA: Diagnosis not present

## 2016-09-05 DIAGNOSIS — H3581 Retinal edema: Secondary | ICD-10-CM | POA: Diagnosis not present

## 2016-09-13 ENCOUNTER — Other Ambulatory Visit: Payer: Self-pay | Admitting: Family Medicine

## 2016-09-13 DIAGNOSIS — Z1231 Encounter for screening mammogram for malignant neoplasm of breast: Secondary | ICD-10-CM

## 2016-09-14 NOTE — Progress Notes (Signed)
PCP notes:   Health maintenance: Tdap - pt will consult insurance.  Pap smear - due. States Dr Glori Bickers told her they were not necessary anymore.   Abnormal screenings: BP 158/78 HR 102 . Pulse Readings from Last 3 Encounters:  09/20/16 (!) 102  09/20/15 (!) 106  12/19/14 90   PHQ9 score: 13. Denies SI/IHI ideation. Declines counseling.  Falls - freq, relates them to alcohol use.   Mini cog and clock test fail.  Patient concerns:  Pt currently drinking 1 pint tequila every day. Pt would like to start taking Naltrexone. Pt would like to stop drinking. States she has done so before with an admission to the hospital.   Pt would like to discuss [place] class.   Nurse concerns: Depression, falls and alcohol use. Pt states that she is not ready to quit drinking but she has to.   I reviewed health advisor's note, was available for consultation, and agree with documentation and plan.    Next PCP appt: 09/22/2016.

## 2016-09-14 NOTE — Progress Notes (Signed)
Subjective:   Stephanie Raymond is a 61 y.o. female who presents for Medicare Annual (Subsequent) preventive examination.  Review of Systems:  No ROS.  Medicare Wellness Visit. Additional risk factors are reflected in the social history.  Cardiac Risk Factors include: advanced age (>23men, >81 women);hypertension;obesity (BMI >30kg/m2);sedentary lifestyle     Objective:     Vitals: BP (!) 158/78 (BP Location: Left Arm, Patient Position: Sitting, Cuff Size: Normal)   Pulse (!) 102   Resp 16   Ht 5' 2.5" (1.588 m)   Wt 172 lb 12.8 oz (78.4 kg)   SpO2 95%   BMI 31.10 kg/m   Body mass index is 31.1 kg/m.   Tobacco History  Smoking Status  . Current Every Day Smoker  . Packs/day: 0.10  . Types: Cigarettes  Smokeless Tobacco  . Never Used    Comment: 1-2 cigaretts a day     Ready to quit: Not Answered Counseling given: Not Answered   Past Medical History:  Diagnosis Date  . Depression   . GERD (gastroesophageal reflux disease)   . History of CT scan 04/1998   small lacunar infarcts  . History of MRI of brain and brain stem 07/1998   white matter changes  . Hx of transient ischemic attack (TIA) 2000   Hosp ?  [facility] Kitchen Hypertension   . Hypothyroidism   . Infertility, female   . Psoriasis    on humira  . Vasculitis ([place])    h/o Henoch-Scholein Purpura   Past Surgical History:  Procedure Laterality Date  . BREAST BIOPSY Left Oct 14 2012   Negative  . carotid ultrasound  04/1998   neg  . CERVICAL CONE BIOPSY  02/1999   microinvasive cervical carcinoma  . CERVICAL CONIZATION W/BX  1980's  . FOOT SURGERY     toe treated, as a child  . PARTIAL HYSTERECTOMY  02/1999   Family History  Problem Relation Age of Onset  . Breast cancer Other 52       Paternal  . Emphysema Mother        smoker  . AAA (abdominal aortic aneurysm) Mother   . Heart disease Father 6       CABG  . Heart disease Other        MI in 99s  . Heart disease Other        CHF  .  Hypertension Other   . AAA (abdominal aortic aneurysm) Maternal Grandmother   . Colon cancer Neg Hx   . Colon polyps Neg Hx   . Stomach cancer Neg Hx   . Rectal cancer Neg Hx    History  Sexual Activity  . Sexual activity: Not on file    Outpatient Encounter Prescriptions as of 09/20/2016  Medication Sig  . folic acid (FOLVITE) 1 MG tablet Take 1 mg by mouth daily.  . methotrexate (RHEUMATREX) 2.5 MG tablet Take 7.5 mg by mouth. Once a week for 30 days.  . prednisoLONE acetate (PRED FORTE) 1 % ophthalmic suspension Place 1 drop into both eyes 4 (four) times daily.  Marland Kitchen albuterol (PROVENTIL HFA;VENTOLIN HFA) 108 (90 Base) MCG/ACT inhaler Inhale 2 puffs into the lungs every 4 (four) hours as needed for wheezing.  Marland Kitchen buPROPion (WELLBUTRIN XL) 300 MG 24 hr tablet Take 1 tablet (300 mg total) by mouth daily.  . Cholecalciferol (VITAMIN D-3 PO) Take 1 capsule by mouth daily.   . furosemide (LASIX) 80 MG tablet Take 1 tablet (80 mg total) by  mouth daily.  Marland Kitchen levothyroxine (SYNTHROID, LEVOTHROID) 50 MCG tablet Take 1 tablet by mouth  daily before breakfast  . magnesium oxide (MAG-OX) 400 MG tablet Take 800 mg by mouth 2 (two) times daily.  Marland Kitchen omeprazole (PRILOSEC) 20 MG capsule Take 1 capsule by mouth  every day  . potassium chloride SA (K-DUR,KLOR-CON) 20 MEQ tablet Take 0.5 tablets (10 mEq total) by mouth 2 (two) times daily. Take with food  . Secukinumab 150 MG/ML SOAJ Inject 150 mg into the skin every 30 (thirty) days. 150 mg/ml  . Spacer/Aero Chamber Mouthpiece MISC To use as directed with albuterol inhaler   No facility-administered encounter medications on file as of 09/20/2016.     Activities of Daily Living In your present state of health, do you have any difficulty performing the following activities: 09/20/2016  Hearing? N  Vision? N  Difficulty concentrating or making decisions? N  Walking or climbing stairs? N  Dressing or bathing? N  Doing errands, shopping? N  Preparing Food and  eating ? N  Using the Toilet? N  In the past six months, have you accidently leaked urine? Y  Comment Occ.  Do you have problems with loss of bowel control? Y  Comment Occ.  Managing your Medications? N  Managing your Finances? N  Housekeeping or managing your Housekeeping? N  Some recent data might be hidden    Patient Care Team: Tower, Wynelle Fanny, MD as PCP - General Feliz Beam, MD as Referring Physician (Ophthalmology) Renata Caprice as Physician Assistant (Orthopedic Surgery)    Assessment:    Physical assessment deferred to PCP.  Exercise Activities and Dietary recommendations Current Exercise Habits: The patient does not participate in regular exercise at present, Exercise limited by: None identified  Goals    . Reduce alcohol intake to 0 servings per day      Fall Risk Fall Risk  09/20/2016  Falls in the past year? Yes  Number falls in past yr: 2 or more  Injury with Fall? No  Risk Factor Category  High Fall Risk  Risk for fall due to : Impaired balance/gait;Impaired mobility;Impaired vision  Follow up Education provided;Falls prevention discussed;Falls evaluation completed  Comment Pt states that she falls due to too much alcohol.   Depression Screen PHQ 2/9 Scores 09/20/2016  PHQ - 2 Score 6  PHQ- 9 Score 13     Cognitive Function PLEASE NOTE: A Mini-Cog screen was completed. Maximum score is 20. A value of 0 denotes this part of [place] MMSE was not completed or the patient failed this part of the Mini-Cog screening.   Mini-Cog Screening Orientation to Time - Max 5 pts Orientation to Place - Max 5 pts Registration - Max 3 pts Recall - Max 3 pts Language Repeat - Max 1 pts Language Follow 3 Step Command - Max 3 pts      Mini-Cog - 09/20/16 1059    Normal clock drawing test? yes   How many words correct? 1      MMSE - Mini Mental State Exam 09/20/2016  Orientation to time 5  Orientation to Place 5  Registration 3  Attention/ Calculation 0    Recall 1  Language- name 2 objects 0  Language- repeat 1  Language- follow 3 step command 3  Language- read & follow direction 0  Write a sentence 0  Copy design 0  Total score 18        Immunization History  Administered Date(s) Administered  .  Hepatitis A 08/07/2011, 04/09/2012  . Influenza Whole 11/21/2006, 11/20/2008  . Influenza-Unspecified 03/07/2016  . Pneumococcal Polysaccharide-23 12/15/2008  . Td 07/22/2001   Screening Tests Health Maintenance  Topic Date Due  . PAP SMEAR  10/01/2015  . INFLUENZA VACCINE  09/20/2016  . TETANUS/TDAP  09/20/2017 (Originally 07/23/2011)  . MAMMOGRAM  10/12/2016  . COLONOSCOPY  09/18/2021  . Hepatitis C Screening  Completed  . HIV Screening  Completed      Plan:   Follow up with PCP as directed.  I have personally reviewed and noted the following in the patient's chart:   . Medical and social history . Use of alcohol, tobacco or illicit drugs  . Current medications and supplements . Functional ability and status . Nutritional status . Physical activity . Advanced directives . List of other physicians . Vitals . Screenings to include cognitive, depression, and falls . Referrals and appointments  In addition, I have reviewed and discussed with patient certain preventive protocols, quality metrics, and best practice recommendations. A written personalized care plan for preventive services as well as general preventive health recommendations were provided to patient.     Ree Edman, RN  09/20/2016

## 2016-09-19 ENCOUNTER — Telehealth: Payer: Self-pay | Admitting: Family Medicine

## 2016-09-19 DIAGNOSIS — Z0000 Encounter for general adult medical examination without abnormal findings: Secondary | ICD-10-CM

## 2016-09-19 NOTE — Telephone Encounter (Signed)
-----   Message from Ellamae Sia sent at 09/11/2016 12:26 PM EDT ----- Regarding: Lab orders for Wednesday, 8.1.18  AWV lab orders, please.

## 2016-09-20 ENCOUNTER — Other Ambulatory Visit (INDEPENDENT_AMBULATORY_CARE_PROVIDER_SITE_OTHER): Payer: Medicare HMO

## 2016-09-20 ENCOUNTER — Ambulatory Visit (INDEPENDENT_AMBULATORY_CARE_PROVIDER_SITE_OTHER): Payer: Medicare HMO

## 2016-09-20 VITALS — BP 158/78 | HR 102 | Resp 16 | Ht 62.5 in | Wt 172.8 lb

## 2016-09-20 DIAGNOSIS — Z0000 Encounter for general adult medical examination without abnormal findings: Secondary | ICD-10-CM

## 2016-09-20 LAB — LIPID PANEL
CHOLESTEROL: 138 mg/dL (ref 0–200)
HDL: 62.7 mg/dL (ref >39.00)
LDL CALC: 58 mg/dL (ref 0–99)
NonHDL: 74.84
TRIGLYCERIDES: 84 mg/dL (ref 0.0–149.0)
Total CHOL/HDL Ratio: 2
VLDL: 16.8 mg/dL (ref 0.0–40.0)

## 2016-09-20 LAB — CBC WITH DIFFERENTIAL/PLATELET
Basophils Absolute: 0 10*3/uL (ref 0.0–0.1)
Basophils Relative: 0.5 % (ref 0.0–3.0)
EOS PCT: 1.9 % (ref 0.0–5.0)
Eosinophils Absolute: 0.1 10*3/uL (ref 0.0–0.7)
HEMATOCRIT: 36.9 % (ref 36.0–46.0)
HEMOGLOBIN: 12.3 g/dL (ref 12.0–15.0)
LYMPHS PCT: 24.5 % (ref 12.0–46.0)
Lymphs Abs: 0.9 10*3/uL (ref 0.7–4.0)
MCHC: 33.4 g/dL (ref 30.0–36.0)
MCV: 100.3 fl — AB (ref 78.0–100.0)
MONO ABS: 0.3 10*3/uL (ref 0.1–1.0)
Monocytes Relative: 7 % (ref 3.0–12.0)
Neutro Abs: 2.4 10*3/uL (ref 1.4–7.7)
Neutrophils Relative %: 66.1 % (ref 43.0–77.0)
Platelets: 65 10*3/uL — ABNORMAL LOW (ref 150.0–400.0)
RBC: 3.68 Mil/uL — AB (ref 3.87–5.11)
RDW: 13.7 % (ref 11.5–15.5)
WBC: 3.7 10*3/uL — AB (ref 4.0–10.5)

## 2016-09-20 LAB — COMPREHENSIVE METABOLIC PANEL
ALBUMIN: 3.8 g/dL (ref 3.5–5.2)
ALK PHOS: 49 U/L (ref 39–117)
ALT: 15 U/L (ref 0–35)
AST: 37 U/L (ref 0–37)
BUN: 9 mg/dL (ref 6–23)
CALCIUM: 9.3 mg/dL (ref 8.4–10.5)
CO2: 31 mEq/L (ref 19–32)
Chloride: 98 mEq/L (ref 96–112)
Creatinine, Ser: 0.8 mg/dL (ref 0.40–1.20)
GFR: 77.55 mL/min (ref >60.00)
Glucose, Bld: 80 mg/dL (ref 70–99)
Potassium: 3.8 mEq/L (ref 3.5–5.1)
Sodium: 138 mEq/L (ref 135–145)
TOTAL PROTEIN: 7.5 g/dL (ref 6.0–8.3)
Total Bilirubin: 1.3 mg/dL — ABNORMAL HIGH (ref 0.2–1.2)

## 2016-09-20 LAB — TSH: TSH: 5.4 u[IU]/mL — AB (ref 0.35–4.50)

## 2016-09-20 NOTE — Patient Instructions (Signed)
Stephanie Raymond , Thank you for taking time to come for your Medicare Wellness Visit. I appreciate your ongoing commitment to your health goals. Please review the following plan we discussed and let me know if I can assist you in the future.   These are the goals we discussed: Goals    None      This is a list of the screening recommended for you and due dates:  Health Maintenance  Topic Date Due  . Tetanus Vaccine  07/23/2011  . Pap Smear  10/01/2015  . Flu Shot  09/20/2016  . Mammogram  10/12/2016  . Colon Cancer Screening  09/18/2021  .  Hepatitis C: One time screening is recommended by Center for Disease Control  (CDC) for  adults born from [birth year] through 1965.   Completed  . HIV Screening  Completed   Preventive Care for Adults  A healthy lifestyle and preventive care can promote health and wellness. Preventive health guidelines for adults include the following key practices.  . A routine yearly physical is a good way to check with your health care provider about your health and preventive screening. It is a chance to share any concerns and updates on your health and to receive a thorough exam.  . Visit your dentist for a routine exam and preventive care every 6 months. Brush your teeth twice a day and floss once a day. Good oral hygiene prevents tooth decay and gum disease.  . The frequency of eye exams is based on your age, health, family medical history, use  of contact lenses, and other factors. Follow your health care provider's ecommendations for frequency of eye exams.  . Eat a healthy diet. Foods like vegetables, fruits, whole grains, low-fat dairy products, and lean protein foods contain the nutrients you need without too many calories. Decrease your intake of foods high in solid fats, added sugars, and salt. Eat the right amount of calories for you. Get information about a proper diet from your health care provider, if necessary.  . Regular physical exercise is one of the  most important things you can do for your health. Most adults should get at least 150 minutes of moderate-intensity exercise (any activity that increases your heart rate and causes you to sweat) each week. In addition, most adults need muscle-strengthening exercises on 2 or more days a week.  Silver Sneakers may be a benefit available to you. To determine eligibility, you may visit the website: www.silversneakers.com or contact program at 2706831544 Mon-Fri between 8AM-8PM.   . Maintain a healthy weight. The body mass index (BMI) is a screening tool to identify possible weight problems. It provides an estimate of body fat based on height and weight. Your health care provider can find your BMI and can help you achieve or maintain a healthy weight.   For adults 20 years and older: ? A BMI below 18.5 is considered underweight. ? A BMI of 18.5 to 24.9 is normal. ? A BMI of 25 to 29.9 is considered overweight. ? A BMI of 30 and above is considered obese.   . Maintain normal blood lipids and cholesterol levels by exercising and minimizing your intake of saturated fat. Eat a balanced diet with plenty of fruit and vegetables. Blood tests for lipids and cholesterol should begin at age 23 and be repeated every 5 years. If your lipid or cholesterol levels are high, you are over 50, or you are at high risk for heart disease, you may need your cholesterol  levels checked more frequently. Ongoing high lipid and cholesterol levels should be treated with medicines if diet and exercise are not working.  . If you smoke, find out from your health care provider how to quit. If you do not use tobacco, please do not start.  . If you choose to drink alcohol, please do not consume more than 2 drinks per day. One drink is considered to be 12 ounces (355 mL) of beer, 5 ounces (148 mL) of wine, or 1.5 ounces (44 mL) of liquor.  . If you are 48-66 years old, ask your health care provider if you should take aspirin to  prevent strokes.  . Use sunscreen. Apply sunscreen liberally and repeatedly throughout the day. You should seek shade when your shadow is shorter than you. Protect yourself by wearing long sleeves, pants, a wide-brimmed hat, and sunglasses year round, whenever you are outdoors.  . Once a month, do a whole body skin exam, using a mirror to look at the skin on your back. Tell your health care provider of new moles, moles that have irregular borders, moles that are larger than a pencil eraser, or moles that have changed in shape or color.

## 2016-09-22 ENCOUNTER — Encounter: Payer: Self-pay | Admitting: Family Medicine

## 2016-09-22 ENCOUNTER — Ambulatory Visit (INDEPENDENT_AMBULATORY_CARE_PROVIDER_SITE_OTHER): Payer: Medicare HMO | Admitting: Family Medicine

## 2016-09-22 VITALS — BP 130/80 | HR 107 | Ht 62.5 in | Wt 170.0 lb

## 2016-09-22 DIAGNOSIS — K7030 Alcoholic cirrhosis of liver without ascites: Secondary | ICD-10-CM | POA: Diagnosis not present

## 2016-09-22 DIAGNOSIS — F1010 Alcohol abuse, uncomplicated: Secondary | ICD-10-CM | POA: Diagnosis not present

## 2016-09-22 DIAGNOSIS — E039 Hypothyroidism, unspecified: Secondary | ICD-10-CM

## 2016-09-22 DIAGNOSIS — G119 Hereditary ataxia, unspecified: Secondary | ICD-10-CM | POA: Diagnosis not present

## 2016-09-22 DIAGNOSIS — L408 Other psoriasis: Secondary | ICD-10-CM | POA: Diagnosis not present

## 2016-09-22 DIAGNOSIS — D690 Allergic purpura: Secondary | ICD-10-CM | POA: Diagnosis not present

## 2016-09-22 DIAGNOSIS — Z0000 Encounter for general adult medical examination without abnormal findings: Secondary | ICD-10-CM

## 2016-09-22 DIAGNOSIS — Z0001 Encounter for general adult medical examination with abnormal findings: Secondary | ICD-10-CM

## 2016-09-22 DIAGNOSIS — F17200 Nicotine dependence, unspecified, uncomplicated: Secondary | ICD-10-CM

## 2016-09-22 DIAGNOSIS — F4323 Adjustment disorder with mixed anxiety and depressed mood: Secondary | ICD-10-CM

## 2016-09-22 DIAGNOSIS — I10 Essential (primary) hypertension: Secondary | ICD-10-CM | POA: Diagnosis not present

## 2016-09-22 DIAGNOSIS — D696 Thrombocytopenia, unspecified: Secondary | ICD-10-CM

## 2016-09-22 DIAGNOSIS — R69 Illness, unspecified: Secondary | ICD-10-CM | POA: Diagnosis not present

## 2016-09-22 MED ORDER — FUROSEMIDE 80 MG PO TABS: 80.0000 mg | ORAL_TABLET | Freq: Every day | ORAL | 3 refills | Status: DC

## 2016-09-22 MED ORDER — BUPROPION HCL ER (XL) 300 MG PO TB24: 300.0000 mg | ORAL_TABLET | Freq: Every day | ORAL | 3 refills | Status: DC

## 2016-09-22 MED ORDER — NALTREXONE HCL 50 MG PO TABS: 50.0000 mg | ORAL_TABLET | Freq: Every day | ORAL | 5 refills | Status: DC

## 2016-09-22 MED ORDER — ALBUTEROL SULFATE HFA 108 (90 BASE) MCG/ACT IN AERS: 2.0000 | INHALATION_SPRAY | RESPIRATORY_TRACT | 3 refills | Status: DC | PRN

## 2016-09-22 MED ORDER — LEVOTHYROXINE SODIUM 75 MCG PO TABS: 75.0000 ug | ORAL_TABLET | Freq: Every day | ORAL | 3 refills | Status: DC

## 2016-09-22 MED ORDER — CHLORDIAZEPOXIDE HCL 25 MG PO CAPS: 25.0000 mg | ORAL_CAPSULE | Freq: Three times a day (TID) | ORAL | 0 refills | Status: AC | PRN

## 2016-09-22 MED ORDER — OMEPRAZOLE 20 MG PO CPDR: DELAYED_RELEASE_CAPSULE | ORAL | 3 refills | Status: AC

## 2016-09-22 MED ORDER — POTASSIUM CHLORIDE CRYS ER 20 MEQ PO TBCR: 10.0000 meq | EXTENDED_RELEASE_TABLET | Freq: Two times a day (BID) | ORAL | 3 refills | Status: DC

## 2016-09-22 NOTE — Patient Instructions (Addendum)
I want you to take a multi vitamin daily  Also for bone health continue vit D  Continue your folic acid   Increase your thyroid medicine from 50 to 75 mcg daily  We will re check labs in 6 weeks   Stop drinking alcohol and use the liburium with caution up to three times daily as needed for withdrawal (only use it if you need it)  Caution of sedation   Take the naltrexone 50 mg once daily to help aide quitting alcohol   See you in 6 weeks

## 2016-09-22 NOTE — Progress Notes (Signed)
Subjective:    Patient ID: Stephanie Raymond, female    DOB: [birth year], 61 y.o.   MRN: [id number]  HPI Here for health maintenance exam and to review chronic medical problems    Still not working  Very depressed -hard to focus and no motivation  Still drinking  Knows she has to stop   She would like to stop drinking Past had hospitalization for detox  husb would not quit drinking - now he has been alcohol free so it may be easier  Drinks about a pt per day  She craves it but does not know if she gets w/d symptoms w/o it   Never had seizures  ? 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 HSP  Wt Readings from Last 3 Encounters:  09/22/16 170 lb (77.1 kg)  09/20/16 172 lb 12.8 oz (78.4 kg)  09/20/15 157 lb 8 oz (71.4 kg)  nutrition is fine currently  Balanced diet  Does not take any vitamins but folic acid  94.07 kg/m  Had amw on 8/1 18 on mini cog test  Noted still abusing alcohol-interested in trying Naltrexone again  Tdap needs  Pos depression score   No falls recently  Ataxia is improved - but still has some trouble walking as well   Pap 8/14-then partial hyst No gyn symptoms    Mammogram 8/17- recall 1 y- has her appt already scheduled  Self exam- no change   Colonoscopy 7/13-10 y recall   Smoking status - 2 cig per day  Not ready to quit yet   bp is improved today (better on 2nd check BP: 130/80 ) No cp or palpitations or headaches or edema  No side effects to medicines  BP Readings from Last 3 Encounters:  09/22/16 140/90  09/20/16 (!) 158/78  09/20/15 130/80     Pulse Readings from Last 3 Encounters:  09/22/16 (!) 107  09/20/16 (!) 102  09/20/15 (!) 106     Hypothyroidism  Pt has no clinical changes No change in energy level/ hair or skin/ edema and  no tremor  (she has baseline fatigue and tremor) Lab Results  Component Value Date   TSH 5.40 (H) 09/20/2016    Has not missed any doses of thyroid  Will inc dose   Hx of thrombocytopenia  Lab Results  Component Value Date   WBC 3.7 (L) 09/20/2016   HGB 12.3 09/20/2016   HCT 36.9 09/20/2016   MCV 100.3 (H) 09/20/2016   PLT 65.0 (L) 09/20/2016   overall platelets are fairly stable  Has always bruised easily No excessive bleeding No GI bleeds     Chemistry      Component Value Date/Time   NA 138 09/20/2016 1212   NA 135 05/31/2014 0203   K 3.8 09/20/2016 1212   K 2.6 (L) 05/31/2014 0203   K 5.0 10/18/2010 1026   CL 98 09/20/2016 1212   CL 89 (L) 05/31/2014 0203   CL 82 10/18/2010 1026   CO2 31 09/20/2016 1212   CO2 32 05/31/2014 0203   CO2 18 10/18/2010 1026   BUN 9 09/20/2016 1212   BUN 18 05/31/2014 0203   CREATININE 0.80 09/20/2016 1212   CREATININE 0.81 05/31/2014 0203   CREATININE 2.10 10/18/2010 1026  Component Value Date/Time   CALCIUM 9.3 09/20/2016 1212   CALCIUM 8.9 05/31/2014 0203   CALCIUM 9.1 10/18/2010 1026   ALKPHOS 49 09/20/2016 1212   ALKPHOS 69 05/31/2014 0203   ALKPHOS 86 10/18/2010 1026   AST 37 09/20/2016 1212   AST 99 (H) 05/31/2014 0203   AST 34 10/18/2010 1026   ALT 15 09/20/2016 1212   ALT 27 05/31/2014 0203   BILITOT 1.3 (H) 09/20/2016 1212   BILITOT 1.1 05/31/2014 0203   BILITOT 0.9 10/18/2010 1026      Lab Results  Component Value Date   CHOL 138 09/20/2016   HDL 62.70 09/20/2016   LDLCALC 58 09/20/2016   TRIG 84.0 09/20/2016   CHOLHDL 2 09/20/2016   Patient Active Problem List   Diagnosis Date Noted  . Hepatic cirrhosis ([place]) 01/21/2014  . Gallstones 01/21/2014  . Hypokalemia 11/24/2013  . Anemia, chronic disease 09/01/2013  . Thrombocytopenia ([place]) 09/01/2013  . 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 Fatigue 06/16/2013  . Encounter for routine gynecological examination 09/30/2012  . 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 Hemorrhoids 02/28/2011  . Smoker 12/01/2010  . Routine general medical examination at a health care facility 11/30/2010  . Other screening mammogram 11/30/2010  . Hypothyroidism 01/29/2007  . Alcohol abuse 01/29/2007  . Adjustment disorder with mixed anxiety and depressed mood 01/29/2007  . Essential hypertension 01/29/2007  . GERD 01/29/2007  . PSORIASIS 01/29/2007  . CARPAL TUNNEL SYNDROME, HX OF 01/29/2007  . TRANSIENT ISCHEMIC ATTACK, HX OF 01/29/2007   Past Medical History:  Diagnosis Date  . Depression   . GERD (gastroesophageal reflux disease)   . History of CT scan 04/1998   small lacunar infarcts  . History of MRI of brain and brain stem 07/1998   white matter changes  . Hx of transient ischemic attack (TIA) 2000   Hosp ?  [facility] Hypertension   . Hypothyroidism   . Infertility, female   . Psoriasis    on humira  . Vasculitis ([place])    h/o Henoch-Scholein Purpura   Past Surgical History:  Procedure Laterality Date  . BREAST BIOPSY Left Oct 14 2012   Negative  . carotid ultrasound  04/1998   neg  . CERVICAL CONE BIOPSY  02/1999   microinvasive cervical carcinoma  . CERVICAL CONIZATION W/BX  1980's  . FOOT SURGERY     toe treated, as a child  . PARTIAL HYSTERECTOMY  02/1999   Social History  Substance Use Topics  . Smoking status: Current Every Day Smoker    Packs/day: 0.10    Types: Cigarettes  . Smokeless tobacco: Never Used     Comment: 1-2 cigaretts a day  . Alcohol use Yes     Comment: 1 pint/day   Family History  Problem Relation Age of Onset  . Breast cancer Other 61       Paternal  . Emphysema Mother        smoker  . AAA (abdominal aortic aneurysm) Mother   . Heart disease Father 46       CABG  . Heart disease Other        MI in 11s  . Heart disease Other        CHF  . Hypertension Other   . AAA (abdominal aortic aneurysm) Maternal Grandmother   . Colon cancer Neg Hx   . Colon polyps Neg Hx   . Stomach cancer Neg Hx     . Rectal cancer Neg Hx  No Known Allergies Current Outpatient Prescriptions on File Prior to Visit  Medication Sig Dispense Refill  . Cholecalciferol (VITAMIN D-3 PO) Take 1 capsule by mouth daily.     . folic acid (FOLVITE) 1 MG tablet Take 1 mg by mouth daily.    . magnesium oxide (MAG-OX) 400 MG tablet Take 800 mg by mouth 2 (two) times daily.    . methotrexate (RHEUMATREX) 2.5 MG tablet Take 7.5 mg by mouth. Once a week for 30 days.    . prednisoLONE acetate (PRED FORTE) 1 % ophthalmic suspension Place 1 drop into both eyes 4 (four) times daily.    . Secukinumab 150 MG/ML SOAJ Inject 150 mg into the skin every 30 (thirty) days. 150 mg/ml    . Spacer/Aero Chamber Mouthpiece MISC To use as directed with albuterol inhaler 1 each 0   No current facility-administered medications on file prior to visit.     Review of Systems Review of Systems  Constitutional: Negative for fever, appetite change,  and unexpected weight change.  Eyes: Negative for pain and visual disturbance.  Respiratory: Negative for cough and shortness of breath.   Cardiovascular: Negative for cp or palpitations    Gastrointestinal: Negative for nausea, diarrhea and constipation.  Genitourinary: Negative for urgency and frequency.  Skin: Negative for pallor or rash  neg for itching  Neurological: Negative for weakness, light-headedness, numbness and headaches. pos for ataxia (cerebellar)  Hematological: Negative for adenopathy. Does bruise/bleed easily.  Psychiatric/Behavioral: pos for dysphoric and anx mood-per pt caused by etoh abuse  Neg for SI       Objective:   Physical Exam  Constitutional: She appears well-developed and well-nourished. No distress.  Chronically ill appearing female with ataxia and tremor   HENT:  Head: Normocephalic and atraumatic.  Right Ear: External ear normal.  Left Ear: External ear normal.  Mouth/Throat: Oropharynx is clear and moist.  Eyes: Pupils are equal, round, and  reactive to light. Conjunctivae and EOM are normal. No scleral icterus.  Neck: Normal range of motion. Neck supple. No JVD present. Carotid bruit is not present. No thyromegaly present.  Cardiovascular: Regular rhythm, normal heart sounds and intact distal pulses.  Exam reveals no gallop.   Mild tachycardia  Pulmonary/Chest: Effort normal and breath sounds normal. No respiratory distress. She has no wheezes. She exhibits no tenderness.  Diffusely distant bs Good air exchange today  Abdominal: Soft. Bowel sounds are normal. She exhibits no distension, no abdominal bruit and no mass. There is no tenderness. There is no rebound and no guarding.  No RUQ tenderness  Genitourinary: No breast swelling, tenderness, discharge or bleeding.  Musculoskeletal: Normal range of motion. She exhibits no edema or tenderness.  Lymphadenopathy:    She has no cervical adenopathy.  Neurological: She is alert. She has normal reflexes. She displays tremor. No cranial nerve deficit. She exhibits normal muscle tone.  Baseline cerebellar ataxia with slow unsteady gait- can get on table with assistance  Tremor consistent with heavy etoh use   Skin: Skin is warm and dry. No rash noted. No erythema. No pallor.  No jaundice today  Psychiatric: Her speech is normal and behavior is normal. Thought content normal. Her mood appears anxious. Her affect is not blunt. She exhibits a depressed mood.  Pleasant and attentive but depressed appearing Candid about her struggles with etoh          Assessment & Plan:   Problem List Items Addressed This Visit      Cardiovascular and  Mediastinum   Essential hypertension    bp in fair control at this time  BP Readings from Last 1 Encounters:  09/22/16 130/80   No changes needed Disc lifstyle change with low sodium diet and exercise  Labs reviewed  Enc etoh cessatoin      Relevant Medications   furosemide (LASIX) 80 MG tablet   Henoch-Schonlein purpura ([facility])     Currently much better -sees rheumatology  Not active       Relevant Medications   furosemide (LASIX) 80 MG tablet     Digestive   Hepatic cirrhosis ([facility])    From alcoholism and also hx of gallstones  Liver labs are stable  Platelets low /baseline No bleeding  Continue to follow Enc strongly to stop drinking alcohol        Endocrine   Hypothyroidism    Lab Results  Component Value Date   TSH 5.40 (H) 09/20/2016    Increase dose of levothyroxine to 75 mcg daily  Re check 6 weeks at f/u      Relevant Medications   levothyroxine (SYNTHROID, LEVOTHROID) 75 MCG tablet     Nervous and Auditory   Cerebellar ataxia ([facility])    Stable/no falls Continues to drink etoh excessively and enc to stop Disc use of walker or cane when needed        Musculoskeletal and Integument   PSORIASIS    Much improved with current methotrexate and Cogentix  Under care of rheumatology        Other   Adjustment disorder with mixed anxiety and depressed mood    Worse lately in light of etoh  Does want to quit drinking  Disc plan for etoh cessation  Reviewed stressors/ coping techniques/symptoms/ support sources/ tx options and side effects in detail today  Continue wellbutrin      Alcohol abuse    Pt is drinking 1 pt of liquor per day and wants to quit  Declines inpt detox or program and also dislikes AA Would like to get her into counseling-she states her ins will not pay Husband has quit drinking permanently and is a good support for her Px librium for w/d symptoms and rev when/how to use it after quitting (disc habit an sedation potential as well)  Will px naltrexone 50 mg daily as well to help with cessation  She is motivated to quit  Has cirrhosis and ataxia currently  Enc to continue B vitamins and also start mvi daily  Enc hydration and adequate nutrition  F/u 6 wk or earlier if needed       Routine general medical examination at a health care facility - Primary (Chronic)     Reviewed health habits including diet and exercise and skin cancer prevention Reviewed appropriate screening tests for age  Also reviewed health mt list, fam hx and immunization status , as well as social and family history   See HPI Ready to quit drinking-plan made (wants to detox on her own)  Labs rev  Disc nutrition  Ataxia is stable/no falls  Mammogram is scheduled Not ready to quit smoking yet- 2 cig per day      Smoker   Thrombocytopenia ([place])    Platelet ct is 65- stable for her  No new bruising or bleeding Planning to quit etoh /has cirrhosis  No hx of esoph varicies She has seen heme/Dr Grayland Ormond in the past

## 2016-09-24 NOTE — Assessment & Plan Note (Addendum)
Pt is drinking 1 pt of liquor per day and wants to quit  Declines inpt detox or program and also dislikes AA Would like to get her into counseling-she states her ins will not pay Husband has quit drinking permanently and is a good support for her Px librium for w/d symptoms and rev when/how to use it after quitting (disc habit an sedation potential as well)  Will px naltrexone 50 mg daily as well to help with cessation  She is motivated to quit  Has cirrhosis and ataxia currently  Enc to continue B vitamins and also start mvi daily  Enc hydration and adequate nutrition  F/u 6 wk or earlier if needed

## 2016-09-24 NOTE — Assessment & Plan Note (Signed)
Worse lately in light of etoh  Does want to quit drinking  Disc plan for etoh cessation  Reviewed stressors/ coping techniques/symptoms/ support sources/ tx options and side effects in detail today  Continue wellbutrin

## 2016-09-24 NOTE — Assessment & Plan Note (Signed)
Reviewed health habits including diet and exercise and skin cancer prevention Reviewed appropriate screening tests for age  Also reviewed health mt list, fam hx and immunization status , as well as social and family history   See HPI Ready to quit drinking-plan made (wants to detox on her own)  Labs rev  Disc nutrition  Ataxia is stable/no falls  Mammogram is scheduled Not ready to quit smoking yet- 2 cig per day

## 2016-09-24 NOTE — Assessment & Plan Note (Addendum)
Platelet ct is 65- stable for her  No new bruising or bleeding Planning to quit etoh /has cirrhosis  No hx of esoph varicies She has seen heme/Dr Grayland Ormond in the past

## 2016-09-24 NOTE — Assessment & Plan Note (Signed)
Much improved with current methotrexate and Cogentix  Under care of rheumatology

## 2016-09-24 NOTE — Assessment & Plan Note (Signed)
Liver tests are fairly stable Platelets are low baseline (no bleeding)  Enc to quit etoh and wants to work on that

## 2016-09-24 NOTE — Assessment & Plan Note (Signed)
Currently much better -sees rheumatology  Not active

## 2016-09-24 NOTE — Assessment & Plan Note (Signed)
From alcoholism and also hx of gallstones  Liver labs are stable  Platelets low /baseline No bleeding  Continue to follow Enc strongly to stop drinking alcohol

## 2016-09-24 NOTE — Assessment & Plan Note (Signed)
bp in fair control at this time  BP Readings from Last 1 Encounters:  09/22/16 130/80   No changes needed Disc lifstyle change with low sodium diet and exercise  Labs reviewed  Enc etoh cessatoin

## 2016-09-24 NOTE — Assessment & Plan Note (Signed)
Stable/no falls Continues to drink etoh excessively and enc to stop Disc use of walker or cane when needed

## 2016-09-24 NOTE — Assessment & Plan Note (Signed)
Lab Results  Component Value Date   TSH 5.40 (H) 09/20/2016    Increase dose of levothyroxine to 75 mcg daily  Re check 6 weeks at f/u

## 2016-09-25 DIAGNOSIS — M25511 Pain in right shoulder: Secondary | ICD-10-CM | POA: Diagnosis not present

## 2016-09-25 DIAGNOSIS — M7581 Other shoulder lesions, right shoulder: Secondary | ICD-10-CM | POA: Diagnosis not present

## 2016-10-10 DIAGNOSIS — H2513 Age-related nuclear cataract, bilateral: Secondary | ICD-10-CM | POA: Diagnosis not present

## 2016-10-10 DIAGNOSIS — H3581 Retinal edema: Secondary | ICD-10-CM | POA: Diagnosis not present

## 2016-10-10 DIAGNOSIS — H30033 Focal chorioretinal inflammation, peripheral, bilateral: Secondary | ICD-10-CM | POA: Diagnosis not present

## 2016-10-13 ENCOUNTER — Ambulatory Visit
Admission: RE | Admit: 2016-10-13 | Discharge: 2016-10-13 | Disposition: A | Discharge status: Home / Self Care | Payer: Medicare HMO | Source: Ambulatory Visit | Attending: Family Medicine | Admitting: Family Medicine

## 2016-10-13 DIAGNOSIS — Z1231 Encounter for screening mammogram for malignant neoplasm of breast: Secondary | ICD-10-CM | POA: Diagnosis not present

## 2016-10-28 ENCOUNTER — Emergency Department
Admission: EM | Admit: 2016-10-28 | Discharge: 2016-10-28 | Disposition: A | Discharge status: Other Acute Inpatient Hospital | Payer: Medicare HMO | Attending: Emergency Medicine | Admitting: Emergency Medicine

## 2016-10-28 ENCOUNTER — Emergency Department: Payer: Medicare HMO

## 2016-10-28 ENCOUNTER — Encounter: Payer: Self-pay | Admitting: Emergency Medicine

## 2016-10-28 DIAGNOSIS — S0101XA Laceration without foreign body of scalp, initial encounter: Secondary | ICD-10-CM | POA: Diagnosis not present

## 2016-10-28 DIAGNOSIS — R69 Illness, unspecified: Secondary | ICD-10-CM | POA: Diagnosis not present

## 2016-10-28 DIAGNOSIS — Z5181 Encounter for therapeutic drug level monitoring: Secondary | ICD-10-CM | POA: Diagnosis not present

## 2016-10-28 DIAGNOSIS — Z79899 Other long term (current) drug therapy: Secondary | ICD-10-CM | POA: Diagnosis not present

## 2016-10-28 DIAGNOSIS — W01190A Fall on same level from slipping, tripping and stumbling with subsequent striking against furniture, initial encounter: Secondary | ICD-10-CM | POA: Insufficient documentation

## 2016-10-28 DIAGNOSIS — S0181XA Laceration without foreign body of other part of head, initial encounter: Secondary | ICD-10-CM | POA: Diagnosis not present

## 2016-10-28 DIAGNOSIS — S0990XA Unspecified injury of head, initial encounter: Secondary | ICD-10-CM | POA: Diagnosis present

## 2016-10-28 DIAGNOSIS — I10 Essential (primary) hypertension: Secondary | ICD-10-CM | POA: Diagnosis not present

## 2016-10-28 DIAGNOSIS — S01419A Laceration without foreign body of unspecified cheek and temporomandibular area, initial encounter: Secondary | ICD-10-CM | POA: Diagnosis not present

## 2016-10-28 DIAGNOSIS — W19XXXA Unspecified fall, initial encounter: Secondary | ICD-10-CM | POA: Diagnosis not present

## 2016-10-28 DIAGNOSIS — W0110XA Fall on same level from slipping, tripping and stumbling with subsequent striking against unspecified object, initial encounter: Secondary | ICD-10-CM | POA: Diagnosis not present

## 2016-10-28 DIAGNOSIS — Y929 Unspecified place or not applicable: Secondary | ICD-10-CM | POA: Diagnosis not present

## 2016-10-28 DIAGNOSIS — Z9181 History of falling: Secondary | ICD-10-CM | POA: Diagnosis not present

## 2016-10-28 DIAGNOSIS — F17210 Nicotine dependence, cigarettes, uncomplicated: Secondary | ICD-10-CM | POA: Insufficient documentation

## 2016-10-28 DIAGNOSIS — Y939 Activity, unspecified: Secondary | ICD-10-CM | POA: Diagnosis not present

## 2016-10-28 DIAGNOSIS — S12391A Other nondisplaced fracture of fourth cervical vertebra, initial encounter for closed fracture: Secondary | ICD-10-CM | POA: Diagnosis not present

## 2016-10-28 DIAGNOSIS — S12301A Unspecified nondisplaced fracture of fourth cervical vertebra, initial encounter for closed fracture: Secondary | ICD-10-CM

## 2016-10-28 DIAGNOSIS — Y92009 Unspecified place in unspecified non-institutional (private) residence as the place of occurrence of the external cause: Secondary | ICD-10-CM | POA: Diagnosis not present

## 2016-10-28 DIAGNOSIS — E039 Hypothyroidism, unspecified: Secondary | ICD-10-CM | POA: Diagnosis not present

## 2016-10-28 DIAGNOSIS — Z7401 Bed confinement status: Secondary | ICD-10-CM | POA: Diagnosis not present

## 2016-10-28 DIAGNOSIS — S199XXA Unspecified injury of neck, initial encounter: Secondary | ICD-10-CM | POA: Diagnosis not present

## 2016-10-28 DIAGNOSIS — Y999 Unspecified external cause status: Secondary | ICD-10-CM | POA: Insufficient documentation

## 2016-10-28 DIAGNOSIS — S12300A Unspecified displaced fracture of fourth cervical vertebra, initial encounter for closed fracture: Secondary | ICD-10-CM | POA: Diagnosis not present

## 2016-10-28 DIAGNOSIS — M549 Dorsalgia, unspecified: Secondary | ICD-10-CM | POA: Diagnosis not present

## 2016-10-28 LAB — ETHANOL: ALCOHOL ETHYL (B): 228 mg/dL — AB (ref <5)

## 2016-10-28 LAB — CBC WITH DIFFERENTIAL/PLATELET
Basophils Absolute: 0 10*3/uL (ref 0–0.1)
Basophils Relative: 1 %
Eosinophils Absolute: 0.1 10*3/uL (ref 0–0.7)
Eosinophils Relative: 1 %
HCT: 35.3 % (ref 35.0–47.0)
HEMOGLOBIN: 12.4 g/dL (ref 12.0–16.0)
LYMPHS ABS: 1.6 10*3/uL (ref 1.0–3.6)
LYMPHS PCT: 25 %
MCH: 35 pg — AB (ref 26.0–34.0)
MCHC: 35.1 g/dL (ref 32.0–36.0)
MCV: 99.7 fL (ref 80.0–100.0)
MONOS PCT: 7 %
Monocytes Absolute: 0.4 10*3/uL (ref 0.2–0.9)
NEUTROS ABS: 4.1 10*3/uL (ref 1.4–6.5)
NEUTROS PCT: 66 %
Platelets: 67 10*3/uL — ABNORMAL LOW (ref 150–440)
RBC: 3.54 MIL/uL — ABNORMAL LOW (ref 3.80–5.20)
RDW: 15.6 % — ABNORMAL HIGH (ref 11.5–14.5)
WBC: 6.2 10*3/uL (ref 3.6–11.0)

## 2016-10-28 LAB — HEPATIC FUNCTION PANEL
ALBUMIN: 4 g/dL (ref 3.5–5.0)
ALT: 19 U/L (ref 14–54)
AST: 50 U/L — ABNORMAL HIGH (ref 15–41)
Alkaline Phosphatase: 65 U/L (ref 38–126)
BILIRUBIN TOTAL: 1.4 mg/dL — AB (ref 0.3–1.2)
Bilirubin, Direct: 0.3 mg/dL (ref 0.1–0.5)
Indirect Bilirubin: 1.1 mg/dL — ABNORMAL HIGH (ref 0.3–0.9)
TOTAL PROTEIN: 7.8 g/dL (ref 6.5–8.1)

## 2016-10-28 LAB — BASIC METABOLIC PANEL
ANION GAP: 10 (ref 5–15)
BUN: 10 mg/dL (ref 6–20)
CO2: 26 mmol/L (ref 22–32)
Calcium: 9.1 mg/dL (ref 8.9–10.3)
Chloride: 101 mmol/L (ref 101–111)
Creatinine, Ser: 0.86 mg/dL (ref 0.44–1.00)
GFR calc non Af Amer: >60 mL/min (ref >60)
GLUCOSE: 94 mg/dL (ref 65–99)
POTASSIUM: 3.6 mmol/L (ref 3.5–5.1)
Sodium: 137 mmol/L (ref 135–145)

## 2016-10-28 LAB — PROTIME-INR
INR: 1.26
Prothrombin Time: 15.7 seconds — ABNORMAL HIGH (ref 11.4–15.2)

## 2016-10-28 MED ORDER — LIDOCAINE HCL (PF) 1 % IJ SOLN
INTRAMUSCULAR | Status: AC
  Filled 2016-10-28: qty 10

## 2016-10-28 MED ORDER — LIDOCAINE-EPINEPHRINE 1 %-1:100000 IJ SOLN
10.0000 mL | Freq: Once | INTRAMUSCULAR | Status: AC
  Administered 2016-10-28: 10 mL via INTRADERMAL
  Filled 2016-10-28 (×2): qty 10

## 2016-10-28 NOTE — ED Provider Notes (Signed)
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  Charge nurse Lorriane Shire notified and we'll bring her back immediately.   Darel Hong, MD 10/28/16 [phone]

## 2016-10-28 NOTE — ED Notes (Signed)
Called [facility] for transport   [phone]

## 2016-10-28 NOTE — ED Provider Notes (Signed)
[facility] Emergency Department Provider Note  ____________________________________________   First MD Initiated Contact with Patient 10/28/16 [phone]     (approximate)  I have reviewed the triage vital signs and the nursing notes.   HISTORY  Chief Complaint Fall    HPI Stephanie Raymond is a 61 y.o. female who is brought to the emergency department by her husband after mechanical fall this evening while getting into bed. The patient was drinking alcohol this evening when she slipped and fell and struck the right side of her head against her nightstand. She's not sure if she blacked out or not. She immediately bled significantly and her husband applied pressure and the brought her to the emergency department. She does have a past medical history of frequent falls including 3 previous cervical spine fractures. She currently has moderate severity throbbing discomfort in her right temple. Nothing seems to make it better or worse. She has no arm numbness or tingling. No weakness in her arms or legs.   Past Medical History:  Diagnosis Date  . Depression   . GERD (gastroesophageal reflux disease)   . History of CT scan 04/1998   small lacunar infarcts  . History of MRI of brain and brain stem 07/1998   white matter changes  . Hx of transient ischemic attack (TIA) 2000   Hosp ?  [facility] Hypertension   . Hypothyroidism   . Infertility, female   . Psoriasis    on humira  . 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 Hepatic cirrhosis ([place]) 01/21/2014  . Gallstones 01/21/2014  . Hypokalemia 11/24/2013  . Anemia, chronic disease 09/01/2013  . Thrombocytopenia ([place]) 09/01/2013  . 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 Fatigue 06/16/2013  . Encounter for routine gynecological examination 09/30/2012  . 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 Hemorrhoids 02/28/2011  . Smoker 12/01/2010  . Routine  general medical examination at a health care facility 11/30/2010  . Other screening mammogram 11/30/2010  . Hypothyroidism 01/29/2007  . Alcohol abuse 01/29/2007  . Adjustment disorder with mixed anxiety and depressed mood 01/29/2007  . Essential hypertension 01/29/2007  . GERD 01/29/2007  . PSORIASIS 01/29/2007  . CARPAL TUNNEL SYNDROME, HX OF 01/29/2007  . TRANSIENT ISCHEMIC ATTACK, HX OF 01/29/2007    Past Surgical History:  Procedure Laterality Date  . BREAST BIOPSY Left Oct 14 2012   Negative  . carotid ultrasound  04/1998   neg  . CERVICAL CONE BIOPSY  02/1999   microinvasive cervical carcinoma  . CERVICAL CONIZATION W/BX  1980's  . FOOT SURGERY     toe treated, as a child  . PARTIAL HYSTERECTOMY  02/1999    Prior to Admission medications   Medication Sig Start Date End Date Taking? Authorizing Provider  buPROPion (WELLBUTRIN XL) 300 MG 24 hr tablet Take 1 tablet (300 mg total) by mouth daily. 09/22/16  Yes Tower, Wynelle Fanny, MD  chlordiazePOXIDE (LIBRIUM) 25 MG capsule Take 1 capsule (25 mg total) by mouth 3 (three) times daily as needed for withdrawal. Caution of sedation, do not drive with this 10/24/79  Yes Tower, Wynelle Fanny, MD  Cholecalciferol (VITAMIN D-3 PO) Take 1 capsule by mouth daily.    Yes [provider]  folic acid (FOLVITE) 1 MG tablet Take 1 mg by mouth daily. 09/05/16  Yes [provider]  furosemide (LASIX) 80 MG tablet Take 1 tablet (80 mg total) by  mouth daily. 09/22/16  Yes Tower, Wynelle Fanny, MD  levothyroxine (SYNTHROID, LEVOTHROID) 75 MCG tablet Take 1 tablet (75 mcg total) by mouth daily. 09/22/16  Yes Tower, Wynelle Fanny, MD  magnesium oxide (MAG-OX) 400 MG tablet Take 800 mg by mouth 2 (two) times daily.   Yes [provider]  methotrexate (RHEUMATREX) 2.5 MG tablet Take 10 mg by mouth once a week.   Yes [provider]  naltrexone (DEPADE) 50 MG tablet Take 1 tablet (50 mg total) by mouth daily. 09/22/16  Yes Tower, Wynelle Fanny, MD    omeprazole (PRILOSEC) 20 MG capsule Take 1 capsule by mouth  every day 09/22/16  Yes Tower, Wynelle Fanny, MD  potassium chloride SA (K-DUR,KLOR-CON) 20 MEQ tablet Take 0.5 tablets (10 mEq total) by mouth 2 (two) times daily. Take with food 09/22/16  Yes Tower, Wynelle Fanny, MD  prednisoLONE acetate (PRED FORTE) 1 % ophthalmic suspension Place 1 drop into both eyes 4 (four) times daily. 09/05/16  Yes [provider]  Secukinumab 150 MG/ML SOAJ Inject 150 mg into the skin every 30 (thirty) days. 150 mg/ml   Yes [provider]  albuterol (PROVENTIL HFA;VENTOLIN HFA) 108 (90 Base) MCG/ACT inhaler Inhale 2 puffs into the lungs every 4 (four) hours as needed for wheezing. 09/22/16   Tower, Wynelle Fanny, MD  Spacer/Aero Chamber Mouthpiece MISC To use as directed with albuterol inhaler 10/12/15   Tower, Wynelle Fanny, MD    Allergies Patient has no known allergies.  Family History  Problem Relation Age of Onset  . Breast cancer Other 33       Paternal  . Emphysema Mother        smoker  . AAA (abdominal aortic aneurysm) Mother   . Heart disease Father 65       CABG  . Heart disease Other        MI in 25s  . Heart disease Other        CHF  . Hypertension Other   . AAA (abdominal aortic aneurysm) Maternal Grandmother   . Colon cancer Neg Hx   . Colon polyps Neg Hx   . Stomach cancer Neg Hx   . Rectal cancer Neg Hx     Social History Social History  Substance Use Topics  . Smoking status: Current Every Day Smoker    Packs/day: 0.10    Types: Cigarettes  . Smokeless tobacco: Never Used     Comment: 1-2 cigaretts a day  . Alcohol use Yes     Comment: 1 pint/day    Review of Systems Constitutional: No fever/chills Eyes: No visual changes. ENT: No sore throat. Cardiovascular: Denies chest pain. Respiratory: Denies shortness of breath. Gastrointestinal: No abdominal pain.  No nausea, no vomiting.  No diarrhea.  No constipation. Genitourinary: Negative for dysuria. Musculoskeletal: Negative  for back pain. Skin: positive for wound Neurological: positive for headache   ____________________________________________   PHYSICAL EXAM:  VITAL SIGNS: ED Triage Vitals  Enc Vitals Group     BP 10/28/16 0317 (!) 153/93     Pulse Rate 10/28/16 0317 (!) 108     Resp 10/28/16 0317 16     Temp 10/28/16 0317 (!) 97.5 F (36.4 C)     Temp Source 10/28/16 0317 Oral     SpO2 10/28/16 0317 100 %     Weight 10/28/16 0319 160 lb (72.6 kg)     Height 10/28/16 0319 5\' 4"  (1.626 m)     Head Circumference --  Peak Flow --      Pain Score 10/28/16 0317 1     Pain Loc --      Pain Edu? --      Excl. in [place]? --     Constitutional: alert and oriented 4 slurred speech and appears intoxicated Eyes: PERRL EOMI. Head: 4 cm laceration to right temple. Nose: No congestion/rhinnorhea. Mouth/Throat: No trismus Neck: No stridor.  no midline tenderness Cardiovascular: Normal rate, regular rhythm. Grossly normal heart sounds.  Good peripheral circulation. Respiratory: Normal respiratory effort.  No retractions. Lungs CTAB and moving good air Gastrointestinal: soft nontender Musculoskeletal: No lower extremity edema   Neurologic:  no pronator drift 5 out of 5 grips biceps triceps hip flexion and hip extension plantar flexion dorsiflexion sensation intact to light touch throughout Skin:  Skin is warm, dry and intact. No rash noted. Psychiatric: intoxicated but pleasant    ____________________________________________   DIFFERENTIAL includes but not limited to  cervical spine fracture, laceration, intracerebral hemorrhage ____________________________________________   LABS (all labs ordered are listed, but only abnormal results are displayed)  Labs Reviewed  ETHANOL - Abnormal; Notable for the following:       Result Value   Alcohol, Ethyl (B) 228 (*)    All other components within normal limits  HEPATIC FUNCTION PANEL - Abnormal; Notable for the following:    AST 50 (*)    Total  Bilirubin 1.4 (*)    Indirect Bilirubin 1.1 (*)    All other components within normal limits  CBC WITH DIFFERENTIAL/PLATELET - Abnormal; Notable for the following:    RBC 3.54 (*)    MCH 35.0 (*)    RDW 15.6 (*)    Platelets 67 (*)    All other components within normal limits  PROTIME-INR - Abnormal; Notable for the following:    Prothrombin Time 15.7 (*)    All other components within normal limits  BASIC METABOLIC PANEL  URINALYSIS, COMPLETE (UACMP) WITH MICROSCOPIC  URINE DRUG SCREEN, QUALITATIVE ([facility] ONLY)     __________________________________________  EKG   ____________________________________________  RADIOLOGY  CT C-spine with chronic changes but does show no acute C4 facet fracture ____________________________________________   PROCEDURES  Procedure(s) performed: yes  LACERATION REPAIR Performed by: Darel Hong Authorized by: Darel Hong Consent: Verbal consent obtained. Risks and benefits: risks, benefits and alternatives were discussed Consent given by: patient Patient identity confirmed: provided demographic data Prepped and Draped in normal sterile fashion Wound explored  Laceration Location: right temple  Laceration Length: 4cm  No Foreign Bodies seen or palpated  Anesthesia: local infiltration  Local anesthetic: lidocaine 1% with epinephrine  Anesthetic total: 6 ml  Irrigation method: syringe Amount of cleaning: standard  Skin closure: simple interrupted 5-0nylon  Number of sutures: 6  Technique: simple I opted  Patient tolerance: Patient tolerated the procedure well with no immediate complications.   Procedures  Critical Care performed: yes  CRITICAL CARE Performed by: Darel Hong   Total critical care time: 35 minutes  Critical care time was exclusive of separately billable procedures and treating other patients.  Critical care was necessary to treat or prevent imminent or life-threatening  deterioration.  Critical care was time spent personally by me on the following activities: development of treatment plan with patient and/or surrogate as well as nursing, discussions with consultants, evaluation of patient's response to treatment, examination of patient, obtaining history from patient or surrogate, ordering and performing treatments and interventions, ordering and review of laboratory studies, ordering and review of radiographic  studies, pulse oximetry and re-evaluation of patient's condition.   Observation: no ____________________________________________   INITIAL IMPRESSION / ASSESSMENT AND PLAN / ED COURSE  Pertinent labs & imaging results that were available during my care of the patient were reviewed by me and considered in my medical decision making (see chart for details).  the patient arrives neurologically intact although with a significant gash to her right temple. Given her intoxication and trauma she had a CT scan of the C-spine and head. The head CT is negative for acute pathology although the cervical spine CT shows an acute C4 facet fracture. I discussed the case with the [facility] transfer center who spoke with trauma surgeon Dr. Volanda Napoleon who agrees with cervical collar and she has requested that the patient be transferred to the [facility] emergency Department for further care.      ____________________________________________   FINAL CLINICAL IMPRESSION(S) / ED DIAGNOSES  Final diagnoses:  Closed nondisplaced fracture of fourth cervical vertebra, unspecified fracture morphology, initial encounter ([place])  Laceration of scalp, initial encounter      NEW MEDICATIONS STARTED DURING THIS VISIT:  New Prescriptions   No medications on file     Note:  This document was prepared using Dragon voice recognition software and may include unintentional dictation errors.     Darel Hong, MD 10/28/16 [phone]

## 2016-10-28 NOTE — ED Notes (Signed)
emtala reviewed by this RN 

## 2016-10-28 NOTE — ED Notes (Signed)
Patient transported to CT 

## 2016-10-28 NOTE — ED Triage Notes (Signed)
Pt to triage via [place], husband report pt fell, has been drinking tonight.  Laceration to right side of head and right forearm, dressing in place by family.

## 2016-10-28 NOTE — ED Notes (Signed)
Called CT spoke to [facility] to [facility]   [phone]

## 2016-10-28 NOTE — ED Notes (Signed)
Husband states dressing to forearm is from old injury.  Head undressed, 2" laceration noted, active bleeding, pressure dressing applied

## 2016-10-28 NOTE — ED Notes (Signed)
Called pharmacy for lidocaine w/epi

## 2016-10-28 NOTE — ED Provider Notes (Signed)
Clinical Course as of Oct 28 1036  Sat Oct 28, 2016  [facility] has arrived to transport the patient.  I checked on her and she is awake and alert and stable for transport.  I updated the EMTALA documentation  [CF]    Clinical Course User Index [CF] Hinda Kehr, MD      Hinda Kehr, MD 10/28/16 [phone]

## 2016-10-28 NOTE — ED Notes (Signed)
Pt with some difficulty hearing reports drinking last evening fell during the early morning strike head on bedside furniture, husband at bedside reports responding to noise found pt lying in 2 x foot square pool of blood, pt reports prior neck breaks, denies pain or LOC, CMS intact to extremities

## 2016-10-28 NOTE — ED Notes (Signed)
Report given to ED charge nurse at [facility].

## 2016-10-28 NOTE — ED Notes (Signed)
Pressure dressing changed

## 2016-11-06 DIAGNOSIS — Z4802 Encounter for removal of sutures: Secondary | ICD-10-CM | POA: Diagnosis not present

## 2016-11-06 DIAGNOSIS — S0191XD Laceration without foreign body of unspecified part of head, subsequent encounter: Secondary | ICD-10-CM | POA: Diagnosis not present

## 2016-12-11 DIAGNOSIS — S1201XD Stable burst fracture of first cervical vertebra, subsequent encounter for fracture with routine healing: Secondary | ICD-10-CM | POA: Diagnosis not present

## 2016-12-11 DIAGNOSIS — W1839XD Other fall on same level, subsequent encounter: Secondary | ICD-10-CM | POA: Diagnosis not present

## 2016-12-11 DIAGNOSIS — S12491D Other nondisplaced fracture of fifth cervical vertebra, subsequent encounter for fracture with routine healing: Secondary | ICD-10-CM | POA: Diagnosis not present

## 2016-12-11 DIAGNOSIS — M40292 Other kyphosis, cervical region: Secondary | ICD-10-CM | POA: Diagnosis not present

## 2016-12-11 DIAGNOSIS — M542 Cervicalgia: Secondary | ICD-10-CM | POA: Diagnosis not present

## 2016-12-11 DIAGNOSIS — Y33XXXD Other specified events, undetermined intent, subsequent encounter: Secondary | ICD-10-CM | POA: Diagnosis not present

## 2016-12-11 DIAGNOSIS — M4312 Spondylolisthesis, cervical region: Secondary | ICD-10-CM | POA: Diagnosis not present

## 2016-12-11 DIAGNOSIS — S1201XA Stable burst fracture of first cervical vertebra, initial encounter for closed fracture: Secondary | ICD-10-CM | POA: Diagnosis not present

## 2016-12-11 DIAGNOSIS — R69 Illness, unspecified: Secondary | ICD-10-CM | POA: Diagnosis not present

## 2016-12-12 DIAGNOSIS — H3581 Retinal edema: Secondary | ICD-10-CM | POA: Diagnosis not present

## 2016-12-12 DIAGNOSIS — H30033 Focal chorioretinal inflammation, peripheral, bilateral: Secondary | ICD-10-CM | POA: Diagnosis not present

## 2016-12-12 DIAGNOSIS — H2513 Age-related nuclear cataract, bilateral: Secondary | ICD-10-CM | POA: Diagnosis not present

## 2016-12-27 ENCOUNTER — Telehealth: Payer: Self-pay | Admitting: *Deleted

## 2016-12-27 NOTE — Telephone Encounter (Signed)
Can you help me do this? 

## 2016-12-27 NOTE — Telephone Encounter (Signed)
Copied from [place] #[id number]. Topic: General - Other >> Dec 27, 2016  3:22 PM Patrice Paradise wrote: Reason for CRM: Pt husband call and stated that he need a RX for a walker for her. Husband can be reached at [phone].

## 2016-12-28 ENCOUNTER — Encounter: Payer: Self-pay | Admitting: *Deleted

## 2016-12-28 NOTE — Telephone Encounter (Signed)
Attempted to call to see which walker (with or without seat) and for what dx is it for

## 2016-12-28 NOTE — Telephone Encounter (Signed)
Left message for Stephanie Raymond to call back with what kind of walker they are requesting: Regular walker or a walker with a seat.  I also need to know the diagnosis-what she is needing the walker for, prior to writing a Rx.  Ok to [facility] Triage Nurse to get this information when Stephanie Raymond calls back.

## 2016-12-28 NOTE — Telephone Encounter (Signed)
pts husband called back wanting to speak with nurse. In notes it says nurse triage is ok to get the info that dr. Lorelei Pont is needing

## 2016-12-29 ENCOUNTER — Telehealth: Payer: Self-pay | Admitting: *Deleted

## 2016-12-29 DIAGNOSIS — G119 Hereditary ataxia, unspecified: Secondary | ICD-10-CM

## 2016-12-29 NOTE — Telephone Encounter (Signed)
DME order placed for [facility] #[id number].  Not sure what diagnosis to use.  Will forward order to Dr. Lorelei Pont to link diagnosis to order.

## 2016-12-29 NOTE — Telephone Encounter (Signed)
Copied from [place] [phone]. Topic: Quick Communication - Office Called Patient >> Dec 28, 2016  9:18 AM Carter Kitten, CMA wrote: Reason for CRM: When patient's husband calls back, please find out exactly what kind of walker they are wanting and a diagnosis (why she needs the walker) so we can properly write the Rx for the walker. >> Dec 29, 2016 11:52 AM [place], Laban Emperor wrote: Pt husband called back and stated his wife needs a lguex walker model #[id number] because she is disable and all the information is in her chart.

## 2016-12-30 NOTE — Telephone Encounter (Signed)
Dx G11.9 should be fine

## 2017-01-01 ENCOUNTER — Telehealth: Payer: Self-pay | Admitting: Family Medicine

## 2017-01-01 NOTE — Telephone Encounter (Signed)
Attempted to contact patient regarding request for walker; no answer; left message on voicemail

## 2017-01-01 NOTE — Telephone Encounter (Signed)
Left message for Mr. Fiorillo that the Rx for walker is ready to be picked up at the front desk.

## 2017-01-03 ENCOUNTER — Emergency Department
Admission: EM | Admit: 2017-01-03 | Discharge: 2017-01-03 | Disposition: A | Discharge status: Home / Self Care | Payer: Medicare HMO | Attending: Emergency Medicine | Admitting: Emergency Medicine

## 2017-01-03 ENCOUNTER — Ambulatory Visit: Payer: Self-pay

## 2017-01-03 ENCOUNTER — Emergency Department: Payer: Medicare HMO

## 2017-01-03 ENCOUNTER — Encounter: Payer: Self-pay | Admitting: Intensive Care

## 2017-01-03 DIAGNOSIS — R69 Illness, unspecified: Secondary | ICD-10-CM | POA: Diagnosis not present

## 2017-01-03 DIAGNOSIS — R2689 Other abnormalities of gait and mobility: Secondary | ICD-10-CM | POA: Diagnosis not present

## 2017-01-03 DIAGNOSIS — E039 Hypothyroidism, unspecified: Secondary | ICD-10-CM | POA: Diagnosis not present

## 2017-01-03 DIAGNOSIS — I10 Essential (primary) hypertension: Secondary | ICD-10-CM | POA: Insufficient documentation

## 2017-01-03 DIAGNOSIS — R4789 Other speech disturbances: Secondary | ICD-10-CM | POA: Diagnosis not present

## 2017-01-03 DIAGNOSIS — F17210 Nicotine dependence, cigarettes, uncomplicated: Secondary | ICD-10-CM | POA: Insufficient documentation

## 2017-01-03 DIAGNOSIS — Z8673 Personal history of transient ischemic attack (TIA), and cerebral infarction without residual deficits: Secondary | ICD-10-CM | POA: Insufficient documentation

## 2017-01-03 DIAGNOSIS — R296 Repeated falls: Secondary | ICD-10-CM | POA: Diagnosis not present

## 2017-01-03 DIAGNOSIS — R4781 Slurred speech: Secondary | ICD-10-CM | POA: Diagnosis present

## 2017-01-03 DIAGNOSIS — R269 Unspecified abnormalities of gait and mobility: Secondary | ICD-10-CM | POA: Diagnosis not present

## 2017-01-03 DIAGNOSIS — Z79899 Other long term (current) drug therapy: Secondary | ICD-10-CM | POA: Insufficient documentation

## 2017-01-03 LAB — CBC WITH DIFFERENTIAL/PLATELET
BASOS PCT: 1 %
Basophils Absolute: 0 10*3/uL (ref 0–0.1)
EOS ABS: 0 10*3/uL (ref 0–0.7)
Eosinophils Relative: 2 %
HCT: 37.4 % (ref 35.0–47.0)
Hemoglobin: 12.5 g/dL (ref 12.0–16.0)
LYMPHS ABS: 0.6 10*3/uL — AB (ref 1.0–3.6)
Lymphocytes Relative: 24 %
MCH: 33 pg (ref 26.0–34.0)
MCHC: 33.5 g/dL (ref 32.0–36.0)
MCV: 98.6 fL (ref 80.0–100.0)
MONO ABS: 0.2 10*3/uL (ref 0.2–0.9)
MONOS PCT: 7 %
Neutro Abs: 1.7 10*3/uL (ref 1.4–6.5)
Neutrophils Relative %: 66 %
Platelets: 58 10*3/uL — ABNORMAL LOW (ref 150–440)
RBC: 3.79 MIL/uL — ABNORMAL LOW (ref 3.80–5.20)
RDW: 14.5 % (ref 11.5–14.5)
WBC: 2.6 10*3/uL — ABNORMAL LOW (ref 3.6–11.0)

## 2017-01-03 LAB — COMPREHENSIVE METABOLIC PANEL
ALBUMIN: 4 g/dL (ref 3.5–5.0)
ALK PHOS: 65 U/L (ref 38–126)
ALT: 20 U/L (ref 14–54)
AST: 45 U/L — AB (ref 15–41)
Anion gap: 12 (ref 5–15)
BILIRUBIN TOTAL: 1.8 mg/dL — AB (ref 0.3–1.2)
BUN: 8 mg/dL (ref 6–20)
CALCIUM: 9.4 mg/dL (ref 8.9–10.3)
CO2: 27 mmol/L (ref 22–32)
CREATININE: 0.84 mg/dL (ref 0.44–1.00)
Chloride: 97 mmol/L — ABNORMAL LOW (ref 101–111)
GFR calc Af Amer: >60 mL/min (ref >60)
GFR calc non Af Amer: >60 mL/min (ref >60)
GLUCOSE: 83 mg/dL (ref 65–99)
Potassium: 3.3 mmol/L — ABNORMAL LOW (ref 3.5–5.1)
SODIUM: 136 mmol/L (ref 135–145)
TOTAL PROTEIN: 8.1 g/dL (ref 6.5–8.1)

## 2017-01-03 LAB — TROPONIN I: Troponin I: <0.03 ng/mL (ref <0.03)

## 2017-01-03 NOTE — Telephone Encounter (Signed)
Pt's husband called with concern for his wife. He states that his wife is having difficulty getting up out of chair to a standing position and is "wobbling when she walks" and "when she walks she is leaning to one side and if something like the cat is in her way she will fall down." He said she is not dizzy but has fallen 3 times in the past week and is afraid that she is "getting worse". Husband stated she has a h/o broken neck 9 weeks ago and on October 22 and saw a specialist at the [facility]. He said they were going to do and MRI but they never ordered it. Husband wants her to have an MRI and be evaluated. He states she is an alcoholic who has a h/o drinking a pint/ day and is now down to 2 drinks per day. Pt was sleeping at the time of the triage.  Waynetta (flow coordinator) at [facility] and informed of husbands  concern for his wife and questionable onset of ataxia and weakness. Flow coordinator advised to send ot to the  ED asap.   I then advised husband to take her to the nearest ED [facility]) asap to be evaluated.  Reason for Disposition . Patient sounds very sick or weak to the triager  Protocols used: ALCOHOL ABUSE AND DEPENDENCE-A-AH

## 2017-01-03 NOTE — ED Provider Notes (Signed)
[facility] Emergency Department Provider Note  ____________________________________________   First MD Initiated Contact with Patient 01/03/17 1942     (approximate)  I have reviewed the triage vital signs and the nursing notes.   HISTORY  Chief Complaint Gait Problem and Aphasia   HPI Stephanie Raymond is a 61 y.o. female with a history of lacunar infarcts who is presenting to the emergency department today with a worsening shuffling gait over the past 2-3 weeks as well as slurred speech in the evenings.  The patient denies any of these symptoms but is here with her husband who is concerned and brought her to the emergency department for further evaluation.  He states that he has seen his decline over the past 9 weeks ever since the patient broke her neck.  He says that they have a rolling walker coming tomorrow and that the patient will be following up with her spinal surgeon this Monday.  The patient says that she has 1 drink of liquor per night and then feels very tired at about 7 or 8 PM and often falls asleep on the couch.  Coincidently, this is the same time that the patient starts to have her slurring of her speech.  She is denying any weakness or numbness at this time.  Denies any neck pain or back pain.  Past Medical History:  Diagnosis Date  . Depression   . GERD (gastroesophageal reflux disease)   . History of CT scan 04/1998   small lacunar infarcts  . History of MRI of brain and brain stem 07/1998   white matter changes  . Hx of transient ischemic attack (TIA) 2000   Hosp ?  [facility] Hypertension   . Hypothyroidism   . Infertility, female   . Psoriasis    on humira  . 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 Hepatic cirrhosis ([place]) 01/21/2014  . Gallstones 01/21/2014  . Hypokalemia 11/24/2013  . Anemia, chronic disease 09/01/2013  . Thrombocytopenia ([place]) 09/01/2013  .  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 Fatigue 06/16/2013  . Encounter for routine gynecological examination 09/30/2012  . 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 Hemorrhoids 02/28/2011  . Smoker 12/01/2010  . Routine general medical examination at a health care facility 11/30/2010  . Other screening mammogram 11/30/2010  . Hypothyroidism 01/29/2007  . Alcohol abuse 01/29/2007  . Adjustment disorder with mixed anxiety and depressed mood 01/29/2007  . Essential hypertension 01/29/2007  . GERD 01/29/2007  . PSORIASIS 01/29/2007  . CARPAL TUNNEL SYNDROME, HX OF 01/29/2007  . TRANSIENT ISCHEMIC ATTACK, HX OF 01/29/2007    Past Surgical History:  Procedure Laterality Date  . BREAST BIOPSY Left Oct 14 2012   Negative  . carotid ultrasound  04/1998   neg  . CERVICAL CONE BIOPSY  02/1999   microinvasive cervical carcinoma  . CERVICAL CONIZATION W/BX  1980's  . FOOT SURGERY     toe treated, as a child  . PARTIAL HYSTERECTOMY  02/1999    Prior to Admission medications   Medication Sig Start Date End Date Taking? Authorizing Provider  albuterol (PROVENTIL HFA;VENTOLIN HFA) 108 (90 Base) MCG/ACT inhaler Inhale 2 puffs into the lungs every 4 (four) hours as needed for wheezing. 09/22/16   Tower, Wynelle Fanny, MD  buPROPion (WELLBUTRIN XL) 300 MG 24 hr tablet Take 1 tablet (300 mg total) by mouth daily. 09/22/16   Tower, Wynelle Fanny,  MD  chlordiazePOXIDE (LIBRIUM) 25 MG capsule Take 1 capsule (25 mg total) by mouth 3 (three) times daily as needed for withdrawal. Caution of sedation, do not drive with this 02/25/58   Tower, Wynelle Fanny, MD  Cholecalciferol (VITAMIN D-3 PO) Take 1 capsule by mouth daily.     [provider]  folic acid (FOLVITE) 1 MG tablet Take 1 mg by mouth daily. 09/05/16   [provider]  furosemide (LASIX) 80 MG tablet Take 1 tablet (80 mg total) by mouth daily. 09/22/16   Tower, Wynelle Fanny, MD  levothyroxine (SYNTHROID, LEVOTHROID) 75 MCG tablet Take 1 tablet (75 mcg  total) by mouth daily. 09/22/16   Tower, Wynelle Fanny, MD  magnesium oxide (MAG-OX) 400 MG tablet Take 800 mg by mouth 2 (two) times daily.    [provider]  methotrexate (RHEUMATREX) 2.5 MG tablet Take 10 mg by mouth once a week.    [provider]  naltrexone (DEPADE) 50 MG tablet Take 1 tablet (50 mg total) by mouth daily. 09/22/16   Tower, Wynelle Fanny, MD  omeprazole (PRILOSEC) 20 MG capsule Take 1 capsule by mouth  every day 09/22/16   Tower, Wynelle Fanny, MD  potassium chloride SA (K-DUR,KLOR-CON) 20 MEQ tablet Take 0.5 tablets (10 mEq total) by mouth 2 (two) times daily. Take with food 09/22/16   Tower, Wynelle Fanny, MD  prednisoLONE acetate (PRED FORTE) 1 % ophthalmic suspension Place 1 drop into both eyes 4 (four) times daily. 09/05/16   [provider]  Secukinumab 150 MG/ML SOAJ Inject 150 mg into the skin every 30 (thirty) days. 150 mg/ml    [provider]  Spacer/Aero Chamber Mouthpiece MISC To use as directed with albuterol inhaler 10/12/15   Tower, Wynelle Fanny, MD    Allergies Patient has no known allergies.  Family History  Problem Relation Age of Onset  . Breast cancer Other 28       Paternal  . Emphysema Mother        smoker  . AAA (abdominal aortic aneurysm) Mother   . Heart disease Father 40       CABG  . Heart disease Other        MI in 13s  . Heart disease Other        CHF  . Hypertension Other   . AAA (abdominal aortic aneurysm) Maternal Grandmother   . Colon cancer Neg Hx   . Colon polyps Neg Hx   . Stomach cancer Neg Hx   . Rectal cancer Neg Hx     Social History Social History   Tobacco Use  . Smoking status: Current Every Day Smoker    Packs/day: 0.10    Types: Cigarettes  . Smokeless tobacco: Never Used  . Tobacco comment: 1-2 cigaretts a day  Substance Use Topics  . Alcohol use: Yes    Comment: 10ounces per day  . Drug use: No    Review of Systems  Constitutional: No fever/chills Eyes: No visual changes. ENT: No sore  throat. Cardiovascular: Denies chest pain. Respiratory: Denies shortness of breath. Gastrointestinal: No abdominal pain.  No nausea, no vomiting.  No diarrhea.  No constipation. Genitourinary: Negative for dysuria. Musculoskeletal: Negative for back pain. Skin: Negative for rash. Neurological: Negative for headaches, focal weakness or numbness.   ____________________________________________   PHYSICAL EXAM:  VITAL SIGNS: ED Triage Vitals  Enc Vitals Group     BP 01/03/17 1537 (!) 174/98     Pulse Rate 01/03/17 1537 95  Resp 01/03/17 1537 16     Temp 01/03/17 1537 98.2 F (36.8 C)     Temp Source 01/03/17 1537 Oral     SpO2 01/03/17 1537 98 %     Weight 01/03/17 1535 160 lb (72.6 kg)     Height 01/03/17 1535 5\' 4"  (1.626 m)     Head Circumference --      Peak Flow --      Pain Score --      Pain Loc --      Pain Edu? --      Excl. in [place]? --     Constitutional: Alert and oriented. Well appearing and in no acute distress. Eyes: Conjunctivae are normal.  Head: Atraumatic. Nose: No congestion/rhinnorhea. Mouth/Throat: Mucous membranes are moist.  Neck: No stridor.  Patient is wearing a foam collar. Cardiovascular: Normal rate, regular rhythm. Grossly normal heart sounds.   Respiratory: Normal respiratory effort.  No retractions. Lungs CTAB. Gastrointestinal: Soft and nontender. No distention. No CVA tenderness. Musculoskeletal: No lower extremity tenderness nor edema.  No joint effusions.  No tenderness to palpation of the thoracic or lumbar spines.  No deformity or step-off.  5 out of 5 strength to bilateral lower extremities without any saddle anesthesia.  Able to ambulate with a shuffling gait but with only minimal assistance.  Husband said that her gait at this time is "normal."  Neurologic:  Normal speech and language. No gross focal neurologic deficits are appreciated.  No slurred speech.  No weakness.  No facial droop.  No numbness to light touch. Skin:  Skin is  warm, dry and intact. No rash noted. Psychiatric: Mood and affect are normal. Speech and behavior are normal.  ____________________________________________   LABS (all labs ordered are listed, but only abnormal results are displayed)  Labs Reviewed  COMPREHENSIVE METABOLIC PANEL - Abnormal; Notable for the following components:      Result Value   Potassium 3.3 (*)    Chloride 97 (*)    AST 45 (*)    Total Bilirubin 1.8 (*)    All other components within normal limits  CBC WITH DIFFERENTIAL/PLATELET - Abnormal; Notable for the following components:   WBC 2.6 (*)    RBC 3.79 (*)    Platelets 58 (*)    Lymphs Abs 0.6 (*)    All other components within normal limits  TROPONIN I   ____________________________________________  EKG  ED ECG REPORT I, Doran Stabler, the attending physician, personally viewed and interpreted this ECG.   Date: 01/03/2017  EKG Time: 1528  Rate: 92  Rhythm: normal sinus rhythm  Axis: Normal  Intervals:none  ST&T Change: No ST segment elevation or depression but no abnormal T wave inversion.  EKG machine read the EKG as a STEMI but I do not see evidence of this.  ____________________________________________  RADIOLOGY  CT scan with multiple deep nuclei and white matter areas of chronic infarction.  However, there are no acute findings. ____________________________________________   PROCEDURES  Procedure(s) performed:   Procedures  Critical Care performed:   ____________________________________________   INITIAL IMPRESSION / ASSESSMENT AND PLAN / ED COURSE  Pertinent labs & imaging results that were available during my care of the patient were reviewed by me and considered in my medical decision making (see chart for details).  DDX:  Stroke, ataxia, intoxication, deconditioning, metabolic encephalopathy, alcohol abuse  As part of my medical decision making, I reviewed the following data within the electronic MEDICAL RECORD NUMBER  Notes  from prior ED visits  Patient with what appears to be based on exam for her.  No acute findings on the labs as well as the imaging.  Patient without slurred speech.  We discussed eliminating alcohol as well as making sure that she goes to sleep before she gets tired to the point of falling asleep on her couch.  She will be following up with her spinal surgeon on Monday.  Explained the plan to the patient as well as family, husband, was at the bedside.  They are understanding and willing to comply.        ____________________________________________   FINAL CLINICAL IMPRESSION(S) / ED DIAGNOSES  Shuffling gait.  Slurred speech.    NEW MEDICATIONS STARTED DURING THIS VISIT:  This SmartLink is deprecated. Use AVSMEDLIST instead to display the medication list for a patient.   Note:  This document was prepared using Dragon voice recognition software and may include unintentional dictation errors.     Orbie Pyo, MD 01/03/17 2020

## 2017-01-03 NOTE — ED Notes (Signed)
Provider saw the Pt before nurse could assess the Pt. Pt and spouse stated that they "just want to get out of the ED."

## 2017-01-03 NOTE — ED Triage Notes (Signed)
Patients husband reports her gait has become unsteady and more shuffled in the past week. C/o slurred speech at night. Reports 3 falls in the last week. Denies hitting head. No blood thinners. Patients MD recommended she be seen in ER for MRI and concerned about brain bleed. Diagnosed with fractures in her C1 9 weeks ago and wearing C-collar.

## 2017-01-16 DIAGNOSIS — M5002 Cervical disc disorder with myelopathy, mid-cervical region, unspecified level: Secondary | ICD-10-CM | POA: Diagnosis not present

## 2017-01-16 DIAGNOSIS — Z8739 Personal history of other diseases of the musculoskeletal system and connective tissue: Secondary | ICD-10-CM | POA: Diagnosis not present

## 2017-01-16 DIAGNOSIS — R2689 Other abnormalities of gait and mobility: Secondary | ICD-10-CM | POA: Diagnosis not present

## 2017-01-16 DIAGNOSIS — M542 Cervicalgia: Secondary | ICD-10-CM | POA: Diagnosis not present

## 2017-01-17 ENCOUNTER — Other Ambulatory Visit: Payer: Self-pay | Admitting: Orthopedic Surgery

## 2017-01-17 DIAGNOSIS — M5002 Cervical disc disorder with myelopathy, mid-cervical region, unspecified level: Secondary | ICD-10-CM

## 2017-01-19 ENCOUNTER — Telehealth: Payer: Self-pay | Admitting: Family Medicine

## 2017-01-19 NOTE — Telephone Encounter (Signed)
Duplicate phone note.

## 2017-01-19 NOTE — Telephone Encounter (Signed)
Copied from [place] [id number]. Topic: Quick Communication - See Telephone Encounter >> Jan 19, 2017  2:01 PM Hewitt Shorts wrote: CRM for notification. See Telephone encounter for: pt husband is needing to talk with someone about getting at home health for the patient she is falling a lot and he cant do it by himself   Bet number   01/19/17.

## 2017-01-19 NOTE — Telephone Encounter (Signed)
Husband notified and f/u scheduled for 01/23/17 per Dr. Glori Bickers

## 2017-01-19 NOTE — Telephone Encounter (Signed)
Please make an appt to come in.  I think that would be a good idea- we need to have a face to face evaluation in order to start the process going (to get it covered)  We will evaluate her/ see if there is anything we can do and see what home needs she has

## 2017-01-19 NOTE — Telephone Encounter (Signed)
Copied from [place] [id number]. Topic: Inquiry >> Jan 19, 2017  1:24 PM Hewitt Shorts wrote: Reason for CRM pt husband is calling to see what can be done next for him and getting a care giver to help with his wife she is falling and he states he cant do this alone

## 2017-01-20 ENCOUNTER — Emergency Department: Payer: Medicare HMO

## 2017-01-20 ENCOUNTER — Other Ambulatory Visit: Payer: Self-pay

## 2017-01-20 ENCOUNTER — Inpatient Hospital Stay
Admission: EM | Admit: 2017-01-20 | Discharge: 2017-01-25 | DRG: 871 | Disposition: A | Discharge status: Hospice | Payer: Medicare HMO | Attending: Internal Medicine | Admitting: Internal Medicine

## 2017-01-20 DIAGNOSIS — F17210 Nicotine dependence, cigarettes, uncomplicated: Secondary | ICD-10-CM | POA: Diagnosis present

## 2017-01-20 DIAGNOSIS — J449 Chronic obstructive pulmonary disease, unspecified: Secondary | ICD-10-CM | POA: Diagnosis present

## 2017-01-20 DIAGNOSIS — G9340 Encephalopathy, unspecified: Secondary | ICD-10-CM | POA: Diagnosis not present

## 2017-01-20 DIAGNOSIS — K219 Gastro-esophageal reflux disease without esophagitis: Secondary | ICD-10-CM | POA: Diagnosis present

## 2017-01-20 DIAGNOSIS — Z993 Dependence on wheelchair: Secondary | ICD-10-CM

## 2017-01-20 DIAGNOSIS — R0902 Hypoxemia: Secondary | ICD-10-CM

## 2017-01-20 DIAGNOSIS — R627 Adult failure to thrive: Secondary | ICD-10-CM | POA: Diagnosis present

## 2017-01-20 DIAGNOSIS — R0600 Dyspnea, unspecified: Secondary | ICD-10-CM | POA: Diagnosis not present

## 2017-01-20 DIAGNOSIS — E876 Hypokalemia: Secondary | ICD-10-CM

## 2017-01-20 DIAGNOSIS — Z23 Encounter for immunization: Secondary | ICD-10-CM

## 2017-01-20 DIAGNOSIS — Z8541 Personal history of malignant neoplasm of cervix uteri: Secondary | ICD-10-CM | POA: Diagnosis not present

## 2017-01-20 DIAGNOSIS — I10 Essential (primary) hypertension: Secondary | ICD-10-CM | POA: Diagnosis not present

## 2017-01-20 DIAGNOSIS — L409 Psoriasis, unspecified: Secondary | ICD-10-CM | POA: Diagnosis present

## 2017-01-20 DIAGNOSIS — Z8673 Personal history of transient ischemic attack (TIA), and cerebral infarction without residual deficits: Secondary | ICD-10-CM

## 2017-01-20 DIAGNOSIS — Z8249 Family history of ischemic heart disease and other diseases of the circulatory system: Secondary | ICD-10-CM

## 2017-01-20 DIAGNOSIS — S199XXA Unspecified injury of neck, initial encounter: Secondary | ICD-10-CM | POA: Diagnosis not present

## 2017-01-20 DIAGNOSIS — Z803 Family history of malignant neoplasm of breast: Secondary | ICD-10-CM

## 2017-01-20 DIAGNOSIS — G92 Toxic encephalopathy: Secondary | ICD-10-CM | POA: Diagnosis not present

## 2017-01-20 DIAGNOSIS — Z87898 Personal history of other specified conditions: Secondary | ICD-10-CM | POA: Diagnosis not present

## 2017-01-20 DIAGNOSIS — M6281 Muscle weakness (generalized): Secondary | ICD-10-CM | POA: Diagnosis not present

## 2017-01-20 DIAGNOSIS — F329 Major depressive disorder, single episode, unspecified: Secondary | ICD-10-CM | POA: Diagnosis present

## 2017-01-20 DIAGNOSIS — F10239 Alcohol dependence with withdrawal, unspecified: Secondary | ICD-10-CM | POA: Diagnosis present

## 2017-01-20 DIAGNOSIS — R410 Disorientation, unspecified: Secondary | ICD-10-CM | POA: Diagnosis present

## 2017-01-20 DIAGNOSIS — G319 Degenerative disease of nervous system, unspecified: Secondary | ICD-10-CM | POA: Diagnosis present

## 2017-01-20 DIAGNOSIS — F10231 Alcohol dependence with withdrawal delirium: Secondary | ICD-10-CM | POA: Diagnosis not present

## 2017-01-20 DIAGNOSIS — D61818 Other pancytopenia: Secondary | ICD-10-CM | POA: Diagnosis present

## 2017-01-20 DIAGNOSIS — E039 Hypothyroidism, unspecified: Secondary | ICD-10-CM | POA: Diagnosis not present

## 2017-01-20 DIAGNOSIS — Z9911 Dependence on respirator [ventilator] status: Secondary | ICD-10-CM | POA: Diagnosis not present

## 2017-01-20 DIAGNOSIS — Z66 Do not resuscitate: Secondary | ICD-10-CM | POA: Diagnosis present

## 2017-01-20 DIAGNOSIS — Z515 Encounter for palliative care: Secondary | ICD-10-CM

## 2017-01-20 DIAGNOSIS — Z825 Family history of asthma and other chronic lower respiratory diseases: Secondary | ICD-10-CM

## 2017-01-20 DIAGNOSIS — S3991XA Unspecified injury of abdomen, initial encounter: Secondary | ICD-10-CM | POA: Diagnosis not present

## 2017-01-20 DIAGNOSIS — R4182 Altered mental status, unspecified: Secondary | ICD-10-CM | POA: Diagnosis not present

## 2017-01-20 DIAGNOSIS — R262 Difficulty in walking, not elsewhere classified: Secondary | ICD-10-CM | POA: Diagnosis not present

## 2017-01-20 DIAGNOSIS — S0990XA Unspecified injury of head, initial encounter: Secondary | ICD-10-CM | POA: Diagnosis not present

## 2017-01-20 DIAGNOSIS — I639 Cerebral infarction, unspecified: Secondary | ICD-10-CM | POA: Diagnosis not present

## 2017-01-20 DIAGNOSIS — R69 Illness, unspecified: Secondary | ICD-10-CM | POA: Diagnosis not present

## 2017-01-20 DIAGNOSIS — R296 Repeated falls: Secondary | ICD-10-CM | POA: Diagnosis present

## 2017-01-20 DIAGNOSIS — G9341 Metabolic encephalopathy: Secondary | ICD-10-CM | POA: Diagnosis not present

## 2017-01-20 DIAGNOSIS — F1011 Alcohol abuse, in remission: Secondary | ICD-10-CM

## 2017-01-20 DIAGNOSIS — K117 Disturbances of salivary secretion: Secondary | ICD-10-CM

## 2017-01-20 DIAGNOSIS — S0993XA Unspecified injury of face, initial encounter: Secondary | ICD-10-CM | POA: Diagnosis not present

## 2017-01-20 DIAGNOSIS — A419 Sepsis, unspecified organism: Principal | ICD-10-CM | POA: Diagnosis present

## 2017-01-20 DIAGNOSIS — G459 Transient cerebral ischemic attack, unspecified: Secondary | ICD-10-CM | POA: Diagnosis not present

## 2017-01-20 DIAGNOSIS — F10931 Alcohol use, unspecified with withdrawal delirium: Secondary | ICD-10-CM

## 2017-01-20 HISTORY — DX: Chronic obstructive pulmonary disease, unspecified: J44.9

## 2017-01-20 LAB — TSH: TSH: 0.961 u[IU]/mL (ref 0.350–4.500)

## 2017-01-20 LAB — CBC WITH DIFFERENTIAL/PLATELET
BASOS ABS: 0 10*3/uL (ref 0–0.1)
BASOS PCT: 1 %
Eosinophils Absolute: 0 10*3/uL (ref 0–0.7)
Eosinophils Relative: 2 %
HEMATOCRIT: 26 % — AB (ref 35.0–47.0)
Hemoglobin: 8.6 g/dL — ABNORMAL LOW (ref 12.0–16.0)
LYMPHS PCT: 26 %
Lymphs Abs: 0.6 10*3/uL — ABNORMAL LOW (ref 1.0–3.6)
MCH: 32.8 pg (ref 26.0–34.0)
MCHC: 33.2 g/dL (ref 32.0–36.0)
MCV: 98.8 fL (ref 80.0–100.0)
Monocytes Absolute: 0.2 10*3/uL (ref 0.2–0.9)
Monocytes Relative: 8 %
NEUTROS ABS: 1.5 10*3/uL (ref 1.4–6.5)
NEUTROS PCT: 63 %
Platelets: 53 10*3/uL — ABNORMAL LOW (ref 150–440)
RBC: 2.63 MIL/uL — AB (ref 3.80–5.20)
RDW: 14.2 % (ref 11.5–14.5)
WBC: 2.4 10*3/uL — AB (ref 3.6–11.0)

## 2017-01-20 LAB — BASIC METABOLIC PANEL
ANION GAP: 7 (ref 5–15)
BUN: 9 mg/dL (ref 6–20)
CALCIUM: 6.4 mg/dL — AB (ref 8.9–10.3)
CO2: 20 mmol/L — AB (ref 22–32)
Chloride: 113 mmol/L — ABNORMAL HIGH (ref 101–111)
Creatinine, Ser: 0.64 mg/dL (ref 0.44–1.00)
Glucose, Bld: 68 mg/dL (ref 65–99)
Potassium: 2.4 mmol/L — CL (ref 3.5–5.1)
SODIUM: 140 mmol/L (ref 135–145)

## 2017-01-20 LAB — HEPATIC FUNCTION PANEL
ALBUMIN: 2.3 g/dL — AB (ref 3.5–5.0)
ALT: 9 U/L — ABNORMAL LOW (ref 14–54)
AST: 17 U/L (ref 15–41)
Alkaline Phosphatase: 49 U/L (ref 38–126)
Bilirubin, Direct: 0.4 mg/dL (ref 0.1–0.5)
Indirect Bilirubin: 0.8 mg/dL (ref 0.3–0.9)
TOTAL PROTEIN: 4.8 g/dL — AB (ref 6.5–8.1)
Total Bilirubin: 1.2 mg/dL (ref 0.3–1.2)

## 2017-01-20 LAB — MAGNESIUM: Magnesium: 0.9 mg/dL — CL (ref 1.7–2.4)

## 2017-01-20 LAB — URINALYSIS, COMPLETE (UACMP) WITH MICROSCOPIC
BACTERIA UA: NONE SEEN
BILIRUBIN URINE: NEGATIVE
GLUCOSE, UA: NEGATIVE mg/dL
HGB URINE DIPSTICK: NEGATIVE
Ketones, ur: NEGATIVE mg/dL
LEUKOCYTES UA: NEGATIVE
NITRITE: NEGATIVE
PROTEIN: NEGATIVE mg/dL
SPECIFIC GRAVITY, URINE: 1.017 (ref 1.005–1.030)
pH: 6 (ref 5.0–8.0)

## 2017-01-20 LAB — AMMONIA: AMMONIA: 13 umol/L (ref 9–35)

## 2017-01-20 LAB — PROTIME-INR
INR: 1.76
Prothrombin Time: 20.4 seconds — ABNORMAL HIGH (ref 11.4–15.2)

## 2017-01-20 LAB — BETA-HYDROXYBUTYRIC ACID: BETA-HYDROXYBUTYRIC ACID: 0.66 mmol/L — AB (ref 0.05–0.27)

## 2017-01-20 LAB — CK: Total CK: 28 U/L — ABNORMAL LOW (ref 38–234)

## 2017-01-20 LAB — ETHANOL

## 2017-01-20 LAB — TROPONIN I

## 2017-01-20 MED ORDER — HALOPERIDOL LACTATE 5 MG/ML IJ SOLN
INTRAMUSCULAR | Status: AC
  Filled 2017-01-20: qty 1

## 2017-01-20 MED ORDER — LORAZEPAM 2 MG/ML IJ SOLN
2.0000 mg | Freq: Once | INTRAMUSCULAR | Status: AC
  Administered 2017-01-20: 2 mg via INTRAVENOUS
  Filled 2017-01-20: qty 1

## 2017-01-20 MED ORDER — POTASSIUM CHLORIDE 10 MEQ/100ML IV SOLN
10.0000 meq | INTRAVENOUS | Status: AC
  Administered 2017-01-20 (×6): 10 meq via INTRAVENOUS
  Filled 2017-01-20 (×6): qty 100

## 2017-01-20 MED ORDER — IOPAMIDOL (ISOVUE-300) INJECTION 61%
100.0000 mL | Freq: Once | INTRAVENOUS | Status: AC | PRN
  Administered 2017-01-20: 100 mL via INTRAVENOUS

## 2017-01-20 MED ORDER — NICOTINE 21 MG/24HR TD PT24
21.0000 mg | MEDICATED_PATCH | Freq: Every day | TRANSDERMAL | Status: DC
  Administered 2017-01-20 – 2017-01-24 (×5): 21 mg via TRANSDERMAL
  Filled 2017-01-20 (×6): qty 1

## 2017-01-20 MED ORDER — ONDANSETRON HCL 4 MG PO TABS
4.0000 mg | ORAL_TABLET | Freq: Four times a day (QID) | ORAL | Status: DC | PRN
Start: 2017-01-20 — End: 2017-01-25

## 2017-01-20 MED ORDER — ONDANSETRON HCL 4 MG/2ML IJ SOLN: 4.0000 mg | Freq: Four times a day (QID) | INTRAMUSCULAR | Status: DC | PRN

## 2017-01-20 MED ORDER — ACETAMINOPHEN 325 MG PO TABS
650.0000 mg | ORAL_TABLET | Freq: Four times a day (QID) | ORAL | Status: DC | PRN
  Filled 2017-01-20: qty 2

## 2017-01-20 MED ORDER — PANTOPRAZOLE SODIUM 40 MG PO TBEC: 40.0000 mg | DELAYED_RELEASE_TABLET | Freq: Every day | ORAL | Status: DC

## 2017-01-20 MED ORDER — DEXTROSE 5 % AND 0.9 % NACL IV BOLUS
1000.0000 mL | Freq: Once | INTRAVENOUS | Status: AC
  Administered 2017-01-20: 1000 mL via INTRAVENOUS

## 2017-01-20 MED ORDER — THIAMINE HCL 100 MG/ML IJ SOLN
100.0000 mg | Freq: Every day | INTRAMUSCULAR | Status: DC
  Administered 2017-01-20: 100 mg via INTRAVENOUS
  Filled 2017-01-20 (×2): qty 1

## 2017-01-20 MED ORDER — INFLUENZA VAC SPLIT QUAD 0.5 ML IM SUSY
0.5000 mL | PREFILLED_SYRINGE | INTRAMUSCULAR | Status: AC
Start: 2017-01-21 — End: 2017-01-21
  Administered 2017-01-21: 16:00:00 0.5 mL via INTRAMUSCULAR
  Filled 2017-01-20: qty 0.5

## 2017-01-20 MED ORDER — ACETAMINOPHEN 650 MG RE SUPP
650.0000 mg | Freq: Four times a day (QID) | RECTAL | Status: DC | PRN
  Administered 2017-01-21 – 2017-01-23 (×3): 650 mg via RECTAL
  Filled 2017-01-20 (×3): qty 1

## 2017-01-20 MED ORDER — MAGNESIUM SULFATE 4 GM/100ML IV SOLN
4.0000 g | Freq: Once | INTRAVENOUS | Status: AC
Start: 2017-01-20 — End: 2017-01-21
  Administered 2017-01-21: 4 g via INTRAVENOUS
  Filled 2017-01-20: qty 100

## 2017-01-20 MED ORDER — CALCIUM CHLORIDE 10 % IV SOLN: 1.0000 g | Freq: Once | INTRAVENOUS | Status: DC

## 2017-01-20 MED ORDER — ADULT MULTIVITAMIN W/MINERALS CH: 1.0000 | ORAL_TABLET | Freq: Every day | ORAL | Status: DC

## 2017-01-20 MED ORDER — MAGNESIUM SULFATE 4 GM/100ML IV SOLN
4.0000 g | Freq: Once | INTRAVENOUS | Status: AC
  Administered 2017-01-21: 4 g via INTRAVENOUS
  Filled 2017-01-20: qty 100

## 2017-01-20 MED ORDER — MAGNESIUM OXIDE 400 (241.3 MG) MG PO TABS: 400.0000 mg | ORAL_TABLET | Freq: Every day | ORAL | Status: DC

## 2017-01-20 MED ORDER — POTASSIUM CHLORIDE 10 MEQ/100ML IV SOLN
10.0000 meq | INTRAVENOUS | Status: AC
  Administered 2017-01-21 (×2): 10 meq via INTRAVENOUS
  Filled 2017-01-20 (×2): qty 100

## 2017-01-20 MED ORDER — KCL IN DEXTROSE-NACL 40-5-0.45 MEQ/L-%-% IV SOLN
INTRAVENOUS | Status: DC
  Administered 2017-01-21 (×2): via INTRAVENOUS
  Filled 2017-01-20 (×6): qty 1000

## 2017-01-20 MED ORDER — BUPROPION HCL ER (XL) 300 MG PO TB24
300.0000 mg | ORAL_TABLET | Freq: Every day | ORAL | Status: DC
  Filled 2017-01-20 (×2): qty 1

## 2017-01-20 MED ORDER — LORAZEPAM 1 MG PO TABS: 1.0000 mg | ORAL_TABLET | Freq: Four times a day (QID) | ORAL | Status: AC | PRN

## 2017-01-20 MED ORDER — FOLIC ACID 1 MG PO TABS: 1.0000 mg | ORAL_TABLET | Freq: Every day | ORAL | Status: DC

## 2017-01-20 MED ORDER — VITAMIN B-1 100 MG PO TABS: 100.0000 mg | ORAL_TABLET | Freq: Every day | ORAL | Status: DC

## 2017-01-20 MED ORDER — PNEUMOCOCCAL VAC POLYVALENT 25 MCG/0.5ML IJ INJ
0.5000 mL | INJECTION | INTRAMUSCULAR | Status: AC
  Administered 2017-01-22: 0.5 mL via INTRAMUSCULAR
  Filled 2017-01-20: qty 0.5

## 2017-01-20 MED ORDER — HALOPERIDOL LACTATE 5 MG/ML IJ SOLN
2.5000 mg | Freq: Once | INTRAMUSCULAR | Status: AC
  Administered 2017-01-20: 2.5 mg via INTRAVENOUS

## 2017-01-20 MED ORDER — LORAZEPAM 2 MG/ML IJ SOLN
1.0000 mg | Freq: Four times a day (QID) | INTRAMUSCULAR | Status: AC | PRN
  Administered 2017-01-22: 05:00:00 1 mg via INTRAVENOUS
  Administered 2017-01-23: 0.5 mg via INTRAVENOUS
  Filled 2017-01-20 (×2): qty 1

## 2017-01-20 MED ORDER — SODIUM CHLORIDE 0.9 % IV SOLN
2.0000 g | Freq: Once | INTRAVENOUS | Status: AC
  Administered 2017-01-20: 2 g via INTRAVENOUS
  Filled 2017-01-20: qty 20

## 2017-01-20 MED ORDER — ALBUTEROL SULFATE (2.5 MG/3ML) 0.083% IN NEBU: 3.0000 mL | INHALATION_SOLUTION | RESPIRATORY_TRACT | Status: DC | PRN

## 2017-01-20 MED ORDER — SODIUM CHLORIDE 0.9 % IV BOLUS (SEPSIS)
1000.0000 mL | Freq: Once | INTRAVENOUS | Status: AC
  Administered 2017-01-20: 1000 mL via INTRAVENOUS

## 2017-01-20 MED ORDER — LEVOTHYROXINE SODIUM 50 MCG PO TABS: 75.0000 ug | ORAL_TABLET | Freq: Every day | ORAL | Status: DC

## 2017-01-20 NOTE — H&P (Signed)
[facility] NAME: Stephanie Raymond    MR#:  [id number]  DATE OF BIRTH:  [birth year]  DATE OF ADMISSION:  01/20/2017  PRIMARY CARE PHYSICIAN: Tower, Wynelle Fanny, MD   REQUESTING/REFERRING PHYSICIAN: Brenton Grills MD  CHIEF COMPLAINT:   Chief Complaint  Patient presents with  . Failure To Thrive    HISTORY OF PRESENT ILLNESS: Stephanie Raymond  is a 61 y.o. female with a known history of depression, GERD, essential hypertension, hypothyroidism and history of vasculitis as well as heavy alcohol abuse.  Who is brought into the hospital with generalized weakness confusion by her husband.  According to him patient stopped drinking about a week ago.  Which she was prescribed naltrexone and Librium for withdrawal symptoms.  He states that patient did okay for the first 2 days after stopped drinking then she started having weakness.  She started having difficulty with walking.  Now she is not able to walk at all.  She is also been confused.  He states that she there was no seizures or tremors that they noticed after she stopped drinking.  Patient in the emergency room was noted to have severe hypokalemia. Even prior to this episode patient has had difficulty with walking  PAST MEDICAL HISTORY:   Past Medical History:  Diagnosis Date  . Depression   . GERD (gastroesophageal reflux disease)   . History of CT scan 04/1998   small lacunar infarcts  . History of MRI of brain and brain stem 07/1998   white matter changes  . Hx of transient ischemic attack (TIA) 2000   Hosp ?  [facility] Hypertension   . Hypothyroidism   . Infertility, female   . Psoriasis    on humira  . 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 BREAST BIOPSY Left Oct 14 2012   Negative  . carotid ultrasound  04/1998   neg  . CERVICAL CONE BIOPSY  02/1999   microinvasive cervical carcinoma  .  CERVICAL CONIZATION W/BX  1980's  . FOOT SURGERY     toe treated, as a child  . PARTIAL HYSTERECTOMY  02/1999    SOCIAL HISTORY:  Social History   Tobacco Use  . Smoking status: Current Every Day Smoker    Packs/day: 0.10    Types: Cigarettes  . Smokeless tobacco: Never Used  . Tobacco comment: 1-2 cigaretts a day  Substance Use Topics  . Alcohol use: Yes    Comment: Per husband, pt stopped drinking 1 week ago    FAMILY HISTORY:  Family History  Problem Relation Age of Onset  . Breast cancer Other 27       Paternal  . Emphysema Mother        smoker  . AAA (abdominal aortic aneurysm) Mother   . Heart disease Father 68       CABG  . Heart disease Other        MI in 33s  . Heart disease Other        CHF  . Hypertension Other   . AAA (abdominal aortic aneurysm) Maternal Grandmother   . Colon cancer Neg Hx   . Colon polyps Neg Hx   . Stomach cancer Neg Hx   . Rectal cancer Neg Hx     DRUG ALLERGIES: No Known Allergies  REVIEW OF SYSTEMS:   CONSTITUTIONAL: Confused  MEDICATIONS AT  HOME:  Prior to Admission medications   Medication Sig Start Date End Date Taking? Authorizing Provider  buPROPion (WELLBUTRIN XL) 300 MG 24 hr tablet Take 1 tablet (300 mg total) by mouth daily. 09/22/16  Yes Tower, Wynelle Fanny, MD  chlordiazePOXIDE (LIBRIUM) 25 MG capsule Take 1 capsule (25 mg total) by mouth 3 (three) times daily as needed for withdrawal. Caution of sedation, do not drive with this 02/24/95  Yes Tower, Wynelle Fanny, MD  Cholecalciferol (VITAMIN D-3 PO) Take 1 capsule by mouth daily.    Yes [provider]  CYANOCOBALAMIN PO Take 1 tablet by mouth daily.   Yes [provider]  furosemide (LASIX) 80 MG tablet Take 1 tablet (80 mg total) by mouth daily. 09/22/16  Yes Tower, Wynelle Fanny, MD  levothyroxine (SYNTHROID, LEVOTHROID) 75 MCG tablet Take 1 tablet (75 mcg total) by mouth daily. 09/22/16  Yes Tower, Wynelle Fanny, MD  magnesium oxide (MAG-OX) 400 MG tablet Take 400 mg by  mouth daily.    Yes [provider]  methotrexate (RHEUMATREX) 2.5 MG tablet Take 10 mg by mouth once a week.   Yes [provider]  naltrexone (DEPADE) 50 MG tablet Take 1 tablet (50 mg total) by mouth daily. 09/22/16  Yes Tower, Wynelle Fanny, MD  omeprazole (PRILOSEC) 20 MG capsule Take 1 capsule by mouth  every day 09/22/16  Yes Tower, Wynelle Fanny, MD  Secukinumab 150 MG/ML SOAJ Inject 150 mg into the skin every 30 (thirty) days. 150 mg/ml   Yes [provider]  Spacer/Aero Chamber Mouthpiece MISC To use as directed with albuterol inhaler 10/12/15  Yes Tower, Wynelle Fanny, MD  albuterol (PROVENTIL HFA;VENTOLIN HFA) 108 (90 Base) MCG/ACT inhaler Inhale 2 puffs into the lungs every 4 (four) hours as needed for wheezing. Patient not taking: Reported on 01/20/2017 09/22/16   Tower, Wynelle Fanny, MD  folic acid (FOLVITE) 1 MG tablet Take 1 mg by mouth daily. 09/05/16   [provider]  potassium chloride SA (K-DUR,KLOR-CON) 20 MEQ tablet Take 0.5 tablets (10 mEq total) by mouth 2 (two) times daily. Take with food Patient not taking: Reported on 01/20/2017 09/22/16   Tower, Wynelle Fanny, MD      PHYSICAL EXAMINATION:   VITAL SIGNS: Blood pressure (!) 138/93, pulse 95, temperature 98.7 F (37.1 C), temperature source Oral, resp. rate (!) 25, weight 160 lb (72.6 kg), SpO2 97 %.  GENERAL:  61 y.o.-year-old patient lying in the bed heavy breathing EYES: Pupils equal, round, reactive to light and accommodation. No scleral icterus. Extraocular muscles intact.  HEENT: Head atraumatic, normocephalic. Oropharynx and nasopharynx clear.  NECK:  Supple, no jugular venous distention. No thyroid enlargement, no tenderness.  LUNGS: Normal breath sounds bilaterally, no wheezing, rales,rhonchi or crepitation. No use of accessory muscles of respiration.  CARDIOVASCULAR: S1, S2 normal. No murmurs, rubs, or gallops.  ABDOMEN: Soft, nontender, nondistended. Bowel sounds present. No organomegaly or mass.   EXTREMITIES: No pedal edema, cyanosis, or clubbing.  NEUROLOGIC: Cranial nerves II through XII are intact. Muscle strength 5/5 in all extremities. Sensation intact. Gait not checked.  PSYCHIATRIC: The patient is alert and oriented x 3.  SKIN: Patient has bruising around her face  LABORATORY PANEL:   CBC Recent Labs  Lab 01/20/17 1423  WBC 2.4*  HGB 8.6*  HCT 26.0*  PLT 53*  MCV 98.8  MCH 32.8  MCHC 33.2  RDW 14.2  LYMPHSABS 0.6*  MONOABS 0.2  EOSABS 0.0  BASOSABS 0.0   ------------------------------------------------------------------------------------------------------------------  Chemistries  Recent Labs  Lab 01/20/17 1423  NA 140  K 2.4*  CL 113*  CO2 20*  GLUCOSE 68  BUN 9  CREATININE 0.64  CALCIUM 6.4*  MG 0.9*  AST 17  ALT 9*  ALKPHOS 49  BILITOT 1.2   ------------------------------------------------------------------------------------------------------------------ estimated creatinine clearance is 72.2 mL/min (by C-G formula based on SCr of 0.64 mg/dL). ------------------------------------------------------------------------------------------------------------------ No results for input(s): TSH, T4TOTAL, T3FREE, THYROIDAB in the last 72 hours.  Invalid input(s): FREET3   Coagulation profile Recent Labs  Lab 01/20/17 1423  INR 1.76   ------------------------------------------------------------------------------------------------------------------- No results for input(s): DDIMER in the last 72 hours. -------------------------------------------------------------------------------------------------------------------  Cardiac Enzymes Recent Labs  Lab 01/20/17 1423  TROPONINI <0.03   ------------------------------------------------------------------------------------------------------------------ Invalid input(s):  POCBNP  ---------------------------------------------------------------------------------------------------------------  Urinalysis    Component Value Date/Time   COLORURINE AMBER (A) 01/20/2017 1652   APPEARANCEUR CLEAR (A) 01/20/2017 1652   APPEARANCEUR Clear 01/16/2014 1613   LABSPEC 1.017 01/20/2017 1652   LABSPEC 1.008 01/16/2014 1613   PHURINE 6.0 01/20/2017 1652   GLUCOSEU NEGATIVE 01/20/2017 1652   GLUCOSEU Negative 01/16/2014 1613   HGBUR NEGATIVE 01/20/2017 1652   BILIRUBINUR NEGATIVE 01/20/2017 1652   BILIRUBINUR Negative 01/16/2014 1613   KETONESUR NEGATIVE 01/20/2017 1652   PROTEINUR NEGATIVE 01/20/2017 1652   UROBILINOGEN 0.2 06/20/2011 1522   NITRITE NEGATIVE 01/20/2017 1652   LEUKOCYTESUR NEGATIVE 01/20/2017 1652   LEUKOCYTESUR Negative 01/16/2014 1613     RADIOLOGY: Ct Head Wo Contrast  Result Date: 01/20/2017 CLINICAL DATA:  Fall and bruising.  Initial encounter. EXAM: CT HEAD WITHOUT CONTRAST CT MAXILLOFACIAL WITHOUT CONTRAST CT CERVICAL SPINE WITHOUT CONTRAST TECHNIQUE: Multidetector CT imaging of the head, cervical spine, and maxillofacial structures were performed using the standard protocol without intravenous contrast. Multiplanar CT image reconstructions of the cervical spine and maxillofacial structures were also generated. COMPARISON:  01/03/2017 FINDINGS: CT HEAD FINDINGS Brain: No evidence of acute infarction, hemorrhage, hydrocephalus, extra-axial collection or mass lesion/mass effect. Advanced chronic small vessel ischemia with white matter gliosis that is confluent. There have been remote lacunar infarcts in the left pons, left thalamus, and bilateral basal ganglia. 5 mm colloid cyst, best seen on coronal reformats. Vascular: Atherosclerotic calcification.  No hyperdense vessel. Skull: No acute finding CT MAXILLOFACIAL FINDINGS Osseous: Negative for fracture or mandibular dislocation. Orbits: No evidence of injury. Sinuses: No evidence of injury. No  hemosinus. Anterior nasal septal perforation. Soft tissues: Contusion and hematoma along the left cheek. CT CERVICAL SPINE FINDINGS Alignment: Chronic anterolisthesis at C3-4 and C4-5 measuring up to 5 mm. Milder anterolisthesis at C7-T1. Cervical kyphosis. No acute malalignment. Skull base and vertebrae: Remote C1 bilateral posterior arch fractures. Remote left anterior arch and lateral mass fracture of C1. Remote left C4 and C5 facet fractures without displacement. No acute fracture. Soft tissues and spinal canal: No prevertebral fluid or swelling. No visible canal hematoma. Disc levels: Diffuse degenerative disc narrowing. Cervical facet arthropathy from C2-3 to C5-6. Upper chest: No acute finding IMPRESSION: Head CT: 1. No evidence of intracranial injury. 2. Extensive chronic small vessel ischemic injury. 3. 5 mm colloid cyst.  No hydrocephalus. Cervical spine CT: 1. Known remote C1 ring and left lateral mass fractures. Known remote left C4 and C5 facet fractures. No acute fracture or acute malalignment. 2. Advanced disc and facet degeneration with chronic C3-4 and C4-5 anterolisthesis. Maxillofacial CT: Contusion over the left cheek. No fracture or mandibular dislocation. Electronically Signed   By: Monte Fantasia M.D.   On: 01/20/2017 17:10   Ct  Cervical Spine Wo Contrast  Result Date: 01/20/2017 CLINICAL DATA:  Fall and bruising.  Initial encounter. EXAM: CT HEAD WITHOUT CONTRAST CT MAXILLOFACIAL WITHOUT CONTRAST CT CERVICAL SPINE WITHOUT CONTRAST TECHNIQUE: Multidetector CT imaging of the head, cervical spine, and maxillofacial structures were performed using the standard protocol without intravenous contrast. Multiplanar CT image reconstructions of the cervical spine and maxillofacial structures were also generated. COMPARISON:  01/03/2017 FINDINGS: CT HEAD FINDINGS Brain: No evidence of acute infarction, hemorrhage, hydrocephalus, extra-axial collection or mass lesion/mass effect. Advanced chronic  small vessel ischemia with white matter gliosis that is confluent. There have been remote lacunar infarcts in the left pons, left thalamus, and bilateral basal ganglia. 5 mm colloid cyst, best seen on coronal reformats. Vascular: Atherosclerotic calcification.  No hyperdense vessel. Skull: No acute finding CT MAXILLOFACIAL FINDINGS Osseous: Negative for fracture or mandibular dislocation. Orbits: No evidence of injury. Sinuses: No evidence of injury. No hemosinus. Anterior nasal septal perforation. Soft tissues: Contusion and hematoma along the left cheek. CT CERVICAL SPINE FINDINGS Alignment: Chronic anterolisthesis at C3-4 and C4-5 measuring up to 5 mm. Milder anterolisthesis at C7-T1. Cervical kyphosis. No acute malalignment. Skull base and vertebrae: Remote C1 bilateral posterior arch fractures. Remote left anterior arch and lateral mass fracture of C1. Remote left C4 and C5 facet fractures without displacement. No acute fracture. Soft tissues and spinal canal: No prevertebral fluid or swelling. No visible canal hematoma. Disc levels: Diffuse degenerative disc narrowing. Cervical facet arthropathy from C2-3 to C5-6. Upper chest: No acute finding IMPRESSION: Head CT: 1. No evidence of intracranial injury. 2. Extensive chronic small vessel ischemic injury. 3. 5 mm colloid cyst.  No hydrocephalus. Cervical spine CT: 1. Known remote C1 ring and left lateral mass fractures. Known remote left C4 and C5 facet fractures. No acute fracture or acute malalignment. 2. Advanced disc and facet degeneration with chronic C3-4 and C4-5 anterolisthesis. Maxillofacial CT: Contusion over the left cheek. No fracture or mandibular dislocation. Electronically Signed   By: Monte Fantasia M.D.   On: 01/20/2017 17:10   Ct Abdomen Pelvis W Contrast  Result Date: 01/20/2017 CLINICAL DATA:  61 year old female with acute abdominal and pelvic pain and distention. EXAM: CT ABDOMEN AND PELVIS WITH CONTRAST TECHNIQUE: Multidetector CT  imaging of the abdomen and pelvis was performed using the standard protocol following bolus administration of intravenous contrast. CONTRAST:  176mL ISOVUE-300 IOPAMIDOL (ISOVUE-300) INJECTION 61% COMPARISON:  05/25/2011 CTA FINDINGS: Lower chest: No acute abnormality. Hepatobiliary: Cirrhosis identified. No hepatic lesions are present cholelithiasis identified without definite CT evidence of acute cholecystitis. No biliary dilatation. Pancreas: Unremarkable Spleen: Moderate splenomegaly has increased since 2013, now with splenic volume of 150 cc. Adrenals/Urinary Tract: The kidneys, adrenal glands and bladder are unremarkable. Stomach/Bowel: Stomach is within normal limits. Appendix appears normal. No evidence of bowel wall thickening, distention, or inflammatory changes. Vascular/Lymphatic: Aortic atherosclerosis. No enlarged abdominal or pelvic lymph nodes. Reproductive: Status post hysterectomy. No adnexal masses. Other: No ascites, focal collection or pneumoperitoneum. Musculoskeletal: No acute bony abnormality or suspicious lesion. IMPRESSION: 1. No evidence of acute abnormality. 2. Cholelithiasis without CT evidence of acute cholecystitis. Consider ultrasound or nuclear medicine study if there is strong clinical suspicion for acute cholecystitis. 3. Cirrhosis. 4. Moderate splenomegaly, increased since 2013. 5.  Aortic Atherosclerosis (ICD10-I70.0). Electronically Signed   By: Margarette Canada M.D.   On: 01/20/2017 17:06   Ct Maxillofacial Wo Contrast  Result Date: 01/20/2017 CLINICAL DATA:  Fall and bruising.  Initial encounter. EXAM: CT HEAD WITHOUT CONTRAST CT MAXILLOFACIAL  WITHOUT CONTRAST CT CERVICAL SPINE WITHOUT CONTRAST TECHNIQUE: Multidetector CT imaging of the head, cervical spine, and maxillofacial structures were performed using the standard protocol without intravenous contrast. Multiplanar CT image reconstructions of the cervical spine and maxillofacial structures were also generated. COMPARISON:   01/03/2017 FINDINGS: CT HEAD FINDINGS Brain: No evidence of acute infarction, hemorrhage, hydrocephalus, extra-axial collection or mass lesion/mass effect. Advanced chronic small vessel ischemia with white matter gliosis that is confluent. There have been remote lacunar infarcts in the left pons, left thalamus, and bilateral basal ganglia. 5 mm colloid cyst, best seen on coronal reformats. Vascular: Atherosclerotic calcification.  No hyperdense vessel. Skull: No acute finding CT MAXILLOFACIAL FINDINGS Osseous: Negative for fracture or mandibular dislocation. Orbits: No evidence of injury. Sinuses: No evidence of injury. No hemosinus. Anterior nasal septal perforation. Soft tissues: Contusion and hematoma along the left cheek. CT CERVICAL SPINE FINDINGS Alignment: Chronic anterolisthesis at C3-4 and C4-5 measuring up to 5 mm. Milder anterolisthesis at C7-T1. Cervical kyphosis. No acute malalignment. Skull base and vertebrae: Remote C1 bilateral posterior arch fractures. Remote left anterior arch and lateral mass fracture of C1. Remote left C4 and C5 facet fractures without displacement. No acute fracture. Soft tissues and spinal canal: No prevertebral fluid or swelling. No visible canal hematoma. Disc levels: Diffuse degenerative disc narrowing. Cervical facet arthropathy from C2-3 to C5-6. Upper chest: No acute finding IMPRESSION: Head CT: 1. No evidence of intracranial injury. 2. Extensive chronic small vessel ischemic injury. 3. 5 mm colloid cyst.  No hydrocephalus. Cervical spine CT: 1. Known remote C1 ring and left lateral mass fractures. Known remote left C4 and C5 facet fractures. No acute fracture or acute malalignment. 2. Advanced disc and facet degeneration with chronic C3-4 and C4-5 anterolisthesis. Maxillofacial CT: Contusion over the left cheek. No fracture or mandibular dislocation. Electronically Signed   By: Monte Fantasia M.D.   On: 01/20/2017 17:10    EKG: Orders placed or performed during the  hospital encounter of 01/20/17  . ED EKG  . ED EKG  . EKG 12-Lead  . EKG 12-Lead    IMPRESSION AND PLAN: Patient is a 61 year old with alcohol abuse brought in with difficulty with ambulation confusion  1.  Acute encephalopathy could be consequences of patient stopping drinking, also she has severe electrolyte imbalances including hypomagnesemia hypokalemia We will provide her with supportive care So differential is Wernicke's encephalopathy therefore we will treat her with IV thiamine   2.  Severe hypokalemia Replace potassium monitor on telemetry  3.  Severe hypomagnesemia replace magnesium  4.  Alcohol abuse Although she should be out of the window for withdrawal I will place her on CIWA protocol  5.  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  Pancytopenia due to alcohol suppression  7.  Hypothyroidism continue Synthroid  8.  Psoriasis hold methotrexate  9.  Miscellaneous SCDs for DVT prophylaxis in light of severe thrombocytopenia     All the records are reviewed and case discussed with ED provider. Management plans discussed with the patient, family and they are in agreement.  CODE STATUS: Code Status History    This patient does not have a recorded code status. Please follow your organizational policy for patients in this situation.       TOTAL TIME TAKING CARE OF THIS PATIENT: 55 minutes critical care time due to severe electrolyte imbalances   Dustin Flock M.D on 01/20/2017 at 8:11  PM  Between 7am to 6pm - Pager - [phone]  After 6pm go to [email or web address] - password [email or web address] [facility]  [facility]  Office  [phone]  CC: Primary care physician; Tower, Wynelle Fanny, MD

## 2017-01-20 NOTE — ED Notes (Signed)
Date and time results received: 01/20/17 1508 Test: Potassium Critical Value: 2.4  Name of Provider Notified: Dr. Joni Fears  Orders Received? Or Actions Taken?: acknowledged

## 2017-01-20 NOTE — ED Provider Notes (Signed)
[facility] Emergency Department Provider Note  ____________________________________________  Time seen: Approximately 4:38 PM  I have reviewed the triage vital signs and the nursing notes.   HISTORY  Chief Complaint Failure To Thrive  Level 5 Caveat: Portions of the History and Physical were unable to be obtained due to altered mental status.   HPI Stephanie Raymond is a 61 y.o. female with a history of hypertension and psoriasis and alcoholism is brought to the ED by her husband and daughter-in-law today due to progressive altered mental status and generalized weakness. The patient was in her baseline state of health until a week ago when she stopped drinking. She had previously been a daily heavy drinker. Her doctor had given her medication to assist her in detoxing from alcohol. However, within a few days if patient started experiencing significant alcohol withdrawal symptoms.   Patient has had multiple falls recently, most recently about 2 weeks ago. Patient has had very limited oral intake over the past 3 or 4 days and has become progressively weaker until she can no longer stand or walk without being at high risk of falling. Patient is unable to relate any specific complaints.   Past Medical History:  Diagnosis Date  . Depression   . GERD (gastroesophageal reflux disease)   . History of CT scan 04/1998   small lacunar infarcts  . History of MRI of brain and brain stem 07/1998   white matter changes  . Hx of transient ischemic attack (TIA) 2000   Hosp ?  [facility] Hypertension   . Hypothyroidism   . Infertility, female   . Psoriasis    on humira  . Vasculitis ([place])    h/o Henoch-Scholein Purpura     Patient Active Problem List   Diagnosis Date Noted  . Hepatic cirrhosis ([place]) 01/21/2014  . Gallstones 01/21/2014  . Hypokalemia 11/24/2013  . Anemia, chronic disease 09/01/2013  . Thrombocytopenia ([place]) 09/01/2013  . 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 Fatigue 06/16/2013  . Encounter for routine gynecological examination 09/30/2012  . 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 Hemorrhoids 02/28/2011  . Smoker 12/01/2010  . Routine general medical examination at a health care facility 11/30/2010  . Other screening mammogram 11/30/2010  . Hypothyroidism 01/29/2007  . Alcohol abuse 01/29/2007  . Adjustment disorder with mixed anxiety and depressed mood 01/29/2007  . Essential hypertension 01/29/2007  . GERD 01/29/2007  . PSORIASIS 01/29/2007  . CARPAL TUNNEL SYNDROME, HX OF 01/29/2007  . TRANSIENT ISCHEMIC ATTACK, HX OF 01/29/2007     Past Surgical History:  Procedure Laterality Date  . BREAST BIOPSY Left Oct 14 2012   Negative  . carotid ultrasound  04/1998   neg  . CERVICAL CONE BIOPSY  02/1999   microinvasive cervical carcinoma  . CERVICAL CONIZATION W/BX  1980's  . FOOT SURGERY     toe treated, as a child  . PARTIAL HYSTERECTOMY  02/1999     Prior to Admission medications   Medication Sig Start Date End Date Taking? Authorizing Provider  albuterol (PROVENTIL HFA;VENTOLIN HFA) 108 (90 Base) MCG/ACT inhaler Inhale 2 puffs into the lungs every 4 (four) hours as needed for wheezing. 09/22/16   Tower, Wynelle Fanny, MD  buPROPion (WELLBUTRIN XL) 300 MG 24 hr tablet Take 1 tablet (300 mg total) by mouth daily. 09/22/16   Tower, Wynelle Fanny, MD  chlordiazePOXIDE (LIBRIUM) 25 MG capsule Take 1 capsule (25 mg total) by mouth 3 (three) times daily as needed for withdrawal. Caution  of sedation, do not drive with this 03/28/83   Tower, Wynelle Fanny, MD  Cholecalciferol (VITAMIN D-3 PO) Take 1 capsule by mouth daily.     [provider]  folic acid (FOLVITE) 1 MG tablet Take 1 mg by mouth daily. 09/05/16   [provider]  furosemide (LASIX) 80 MG tablet Take 1 tablet (80 mg total) by mouth daily. 09/22/16   Tower, Wynelle Fanny, MD  levothyroxine (SYNTHROID, LEVOTHROID) 75 MCG tablet Take 1 tablet (75 mcg total) by mouth daily. 09/22/16    Tower, Wynelle Fanny, MD  magnesium oxide (MAG-OX) 400 MG tablet Take 800 mg by mouth 2 (two) times daily.    [provider]  methotrexate (RHEUMATREX) 2.5 MG tablet Take 10 mg by mouth once a week.    [provider]  naltrexone (DEPADE) 50 MG tablet Take 1 tablet (50 mg total) by mouth daily. 09/22/16   Tower, Wynelle Fanny, MD  omeprazole (PRILOSEC) 20 MG capsule Take 1 capsule by mouth  every day 09/22/16   Tower, Wynelle Fanny, MD  potassium chloride SA (K-DUR,KLOR-CON) 20 MEQ tablet Take 0.5 tablets (10 mEq total) by mouth 2 (two) times daily. Take with food 09/22/16   Tower, Wynelle Fanny, MD  prednisoLONE acetate (PRED FORTE) 1 % ophthalmic suspension Place 1 drop into both eyes 4 (four) times daily. 09/05/16   [provider]  Secukinumab 150 MG/ML SOAJ Inject 150 mg into the skin every 30 (thirty) days. 150 mg/ml    [provider]  Spacer/Aero Chamber Mouthpiece MISC To use as directed with albuterol inhaler 10/12/15   Tower, Wynelle Fanny, MD     Allergies Patient has no known allergies.   Family History  Problem Relation Age of Onset  . Breast cancer Other 60       Paternal  . Emphysema Mother        smoker  . AAA (abdominal aortic aneurysm) Mother   . Heart disease Father 73       CABG  . Heart disease Other        MI in 77s  . Heart disease Other        CHF  . Hypertension Other   . AAA (abdominal aortic aneurysm) Maternal Grandmother   . Colon cancer Neg Hx   . Colon polyps Neg Hx   . Stomach cancer Neg Hx   . Rectal cancer Neg Hx     Social History Social History   Tobacco Use  . Smoking status: Current Every Day Smoker    Packs/day: 0.10    Types: Cigarettes  . Smokeless tobacco: Never Used  . Tobacco comment: 1-2 cigaretts a day  Substance Use Topics  . Alcohol use: Yes    Comment: Per husband, pt stopped drinking 1 week ago  . Drug use: No    Review of Systems Level V caveat, limited ability to obtain review of systems due to altered mental  status  Constitutional:   No fever Gastrointestinal:   Last bowel movement 1 week ago. No vomiting.  Musculoskeletal:   Multiple bruises from falls, bumping into things, and bruising easily. All other systems reviewed and are negative except as documented above in ROS and HPI.  ____________________________________________   PHYSICAL EXAM:  VITAL SIGNS: ED Triage Vitals [01/20/17 1425]  Enc Vitals Group     BP (!) 170/95     Pulse Rate 95     Resp 16     Temp 98.7 F (37.1 C)  Temp Source Oral     SpO2 96 %     Weight 160 lb (72.6 kg)     Height      Head Circumference      Peak Flow      Pain Score      Pain Loc      Pain Edu?      Excl. in [place]?     Vital signs reviewed, nursing assessments reviewed.   Constitutional:  Awake, disoriented, nonverbal. No acute Distress. Eyes:   No scleral icterus.  EOMI. No nystagmus. No conjunctival pallor. PERRL. no proptosis ENT   Head:   Normocephalic with extensive left maxillary ecchymosis. No bony step-offs or point tenderness or crepitus..   Nose:   No congestion/rhinnorhea.    Mouth/Throat:   MMM, no pharyngeal erythema. No peritonsillar mass. Fetid breath   Neck:   The patient is in soft cervical collar that she wore from home.. Intact range of motion to the left and right Hematological/Lymphatic/Immunilogical:   No cervical lymphadenopathy. Cardiovascular:   RRR. Symmetric bilateral radial and DP pulses.  No murmurs.  Respiratory:   Normal respiratory effort without tachypnea/retractions. Breath sounds are clear and equal bilaterally. No wheezes/rales/rhonchi. Gastrointestinal:   Soft and nontender. Non distended. There is no CVA tenderness.  No rebound, rigidity, or guarding. Genitourinary:   deferred Musculoskeletal:   Normal range of motion in all extremities. No joint effusions.  No lower extremity tenderness.  No edema. No midline spinal tenderness Neurologic:   Nonverbal..  Motor grossly intact. .  Skin:     Skin is warm, dry and intact. No rash noted. Scattered bruises of varying ages.  ____________________________________________    LABS (pertinent positives/negatives) (all labs ordered are listed, but only abnormal results are displayed) Labs Reviewed  BASIC METABOLIC PANEL - Abnormal; Notable for the following components:      Result Value   Potassium 2.4 (*)    Chloride 113 (*)    CO2 20 (*)    Calcium 6.4 (*)    All other components within normal limits  HEPATIC FUNCTION PANEL - Abnormal; Notable for the following components:   Total Protein 4.8 (*)    Albumin 2.3 (*)    ALT 9 (*)    All other components within normal limits  CBC WITH DIFFERENTIAL/PLATELET - Abnormal; Notable for the following components:   WBC 2.4 (*)    RBC 2.63 (*)    Hemoglobin 8.6 (*)    HCT 26.0 (*)    Platelets 53 (*)    Lymphs Abs 0.6 (*)    All other components within normal limits  CK - Abnormal; Notable for the following components:   Total CK 28 (*)    All other components within normal limits  PROTIME-INR - Abnormal; Notable for the following components:   Prothrombin Time 20.4 (*)    All other components within normal limits  BETA-HYDROXYBUTYRIC ACID - Abnormal; Notable for the following components:   Beta-Hydroxybutyric Acid 0.66 (*)    All other components within normal limits  ETHANOL  TROPONIN I  AMMONIA  URINALYSIS, COMPLETE (UACMP) WITH MICROSCOPIC   ____________________________________________   EKG  Interpreted by me Sinus rhythm rate of 94, normal axis and intervals. Normal QRS ST segments and T waves.  ____________________________________________    RADIOLOGY  No results found.  ____________________________________________   PROCEDURES Procedures  ____________________________________________   DIFFERENTIAL DIAGNOSIS  Benzodiazepine delirium, hepatic encephalopathy, traumatic encephalopathy, subdural hematoma, epidural hematoma, C-spine fracture, facial  fracture,  small bowel obstruction versus volvulus versus ileus, ascites  CLINICAL IMPRESSION / ASSESSMENT AND PLAN / ED COURSE  Pertinent labs & imaging results that were available during my care of the patient were reviewed by me and considered in my medical decision making (see chart for details).   Patient presents with altered mental status with recent history of alcohol cessation. Plan to get CT head and neck face for trauma evaluation as well as CT abdomen for possible SBO. No current evidence of GI hemorrhage on exam. Altered mental status, patient will need to be hospitalized after further ED workup. No evidence of infection or sepsis at this time, urinalysis pending  Clinical Course as of Jan 20 1837  Sat Jan 20, 2017  1602 Ammonia normal, not c/w hepatic encephalopathy despite lab evidence of liver failure. ?SDH vs sbo vs TBI. Will f/u scans.   [PS]  1645 Chronic leukopenia. Acute anemia without evidence of active hemorrhage. Elevated INR and low serum protein c/w liver failure. Pt has ketosis c/w poor nutritional intake.   Still awaiting CT results.   [PS]  7628 CT scans negative for traumatic injury such as SDH or acute c spine fx.  No evidence of SBO or other acute abd. Pathology. F/u UA, plan to admit for encephalopathy/gen. weakness  [PS]    Clinical Course User Index [PS] Carrie Mew, MD     ----------------------------------------- 6:39 PM on 01/20/2017 -----------------------------------------  Workup so far unremarkable except for the hypokalemia. However mental status is not improving with hydration and dextrose containing fluid for the ketosis. Patient was given low-dose Haldol, without improvement. Most likely at this point this is due to alcohol withdrawal delirium, we'll give some Ativan. Case discussed with the hospitalist for further management.  ____________________________________________   FINAL CLINICAL IMPRESSION(S) / ED DIAGNOSES    Final  diagnoses:  Encephalopathy acute  Hypokalemia      This SmartLink is deprecated. Use AVSMEDLIST instead to display the medication list for a patient.   Portions of this note were generated with dragon dictation software. Dictation errors may occur despite best attempts at proofreading.    Carrie Mew, MD 01/20/17 [phone]

## 2017-01-20 NOTE — Progress Notes (Addendum)
MEDICATION RELATED CONSULT NOTE - INITIAL   Pharmacy Consult for electrolyte replacement Indication: hypokalemia, hypomagnesemia  No Known Allergies  Patient Measurements: Weight: 160 lb (72.6 kg) Adjusted Body Weight:   Vital Signs: Temp: 98.7 F (37.1 C) (12/01 1425) Temp Source: Oral (12/01 1425) BP: 138/93 (12/01 1900) Pulse Rate: 98 (12/01 2000) Intake/Output from previous day: No intake/output data recorded. Intake/Output from this shift: Total I/O In: 810 [Other:810] Out: -   Labs: Recent Labs    01/20/17 1423  WBC 2.4*  HGB 8.6*  HCT 26.0*  PLT 53*  CREATININE 0.64  MG 0.9*  ALBUMIN 2.3*  PROT 4.8*  AST 17  ALT 9*  ALKPHOS 49  BILITOT 1.2  BILIDIR 0.4  IBILI 0.8   Estimated Creatinine Clearance: 72.2 mL/min (by C-G formula based on SCr of 0.64 mg/dL).   Microbiology: No results found for this or any previous visit (from the past 720 hour(s)).  Medical History: Past Medical History:  Diagnosis Date  . Depression   . GERD (gastroesophageal reflux disease)   . History of CT scan 04/1998   small lacunar infarcts  . History of MRI of brain and brain stem 07/1998   white matter changes  . Hx of transient ischemic attack (TIA) 2000   Hosp ?  [facility] Hypertension   . Hypothyroidism   . Infertility, female   . Psoriasis    on humira  . Vasculitis ([place])    h/o Henoch-Scholein Purpura    Medications:  Infusions:  . calcium gluconate    . magnesium sulfate 1 - 4 g bolus IVPB    . potassium chloride 10 mEq (01/20/17 1750)    Assessment: 89 yof cc FTT, PMH depression, GERD, HTN, hypothyroidism, vasculitis, EtOH SUD. HPI generalized weakness and confusion per husband. Recently stopped drinking with naltrexone and Librium. Severe electrolyte abnormalities noted in ED (K 2.4, Mg 0.9, Ca 6.4, albumin 2.3, adjusted calcium 7.7). Pharmacy consulted to manage electrolyte replacement.   Goal of Therapy:  K 3.5 to 5 (goal > 4) Magnesium 1.7 to 2.4 (goal  >2) Calcium 8.9 to 10.3 Phosphorus 2.5 to 4.9   Plan:  1. Potassium chloride 10 mEq IV Q1H x 6 doses (per EDP); will give additonal KCl 10 mEq IV Q1H x 2 doses for a total of 80 mEq IV 2. Magnesium 4 gm IV x 1 (per hospitalist); will give additional magnesium 4 gm IV x 1 for a total of 8 gm IV 3. Calcium gluconate 2 gm IV x 1 (per hospitalist) 4. Recheck all electrolytes with AM labs  Laural Benes, Pharm.D., BCPS Clinical Pharmacist 01/20/2017,8:32 PM

## 2017-01-20 NOTE — ED Notes (Signed)
Pt back in room from CT. Pt is resting with family at bedside.

## 2017-01-20 NOTE — ED Triage Notes (Signed)
Pt brought by EMS from home.  Pt has a hx of alcoholism, but stopped around Thanksgiving.  Per EMS, they were called out due to failure to thrive, stating pt has not eaten or drank anything in 24 hours.  Per pt she ate an english muffin this AM, and per EMS, pt was drinking water when they arrived.  Pt has bruising that was from a fall several weeks ago.  Pt is non-compliant with medications per EMS.

## 2017-01-20 NOTE — ED Notes (Signed)
Husband at bedside, states pt has had issues walking for "a while now", but that it has gotten worse in the past 48 hours.  Per husband pt has eaten and drank very little in the past 48 hours as well.  Per husband pt was put on 2 new medications in the past week to help assist patient to stop drinking.  Husband is unclear on what symptoms are acute and what symptoms pt had prior to pt stopping drinking.  Pt is falling asleep off/on during assessment and is slow to answer questions.

## 2017-01-20 NOTE — ED Notes (Signed)
Date and time results received: 01/20/17 1930   Test: Mg Critical Value: 0.9  Name of Provider Notified: Joni Fears

## 2017-01-20 NOTE — ED Notes (Signed)
Date and time results received: 01/20/17 1508 Test: Calcium Critical Value: 6.4  Name of Provider Notified: Dr. Joni Fears  Orders Received? Or Actions Taken?: acknowledged

## 2017-01-20 NOTE — ED Notes (Signed)
Patient transported to CT 

## 2017-01-20 NOTE — ED Notes (Signed)
Patient placed on bedpan but unable to void at this time.  Patients brief changed.

## 2017-01-21 LAB — CBC
HEMATOCRIT: 36.9 % (ref 35.0–47.0)
Hemoglobin: 12.3 g/dL (ref 12.0–16.0)
MCH: 32.8 pg (ref 26.0–34.0)
MCHC: 33.4 g/dL (ref 32.0–36.0)
MCV: 98.3 fL (ref 80.0–100.0)
PLATELETS: 70 10*3/uL — AB (ref 150–440)
RBC: 3.75 MIL/uL — ABNORMAL LOW (ref 3.80–5.20)
RDW: 14.6 % — AB (ref 11.5–14.5)
WBC: 5.6 10*3/uL (ref 3.6–11.0)

## 2017-01-21 LAB — MAGNESIUM: Magnesium: 3 mg/dL — ABNORMAL HIGH (ref 1.7–2.4)

## 2017-01-21 LAB — COMPREHENSIVE METABOLIC PANEL
ALT: 10 U/L — ABNORMAL LOW (ref 14–54)
ANION GAP: 10 (ref 5–15)
AST: 27 U/L (ref 15–41)
Albumin: 3.3 g/dL — ABNORMAL LOW (ref 3.5–5.0)
Alkaline Phosphatase: 69 U/L (ref 38–126)
BILIRUBIN TOTAL: 1.8 mg/dL — AB (ref 0.3–1.2)
BUN: 8 mg/dL (ref 6–20)
CHLORIDE: 103 mmol/L (ref 101–111)
CO2: 21 mmol/L — ABNORMAL LOW (ref 22–32)
Calcium: 9.2 mg/dL (ref 8.9–10.3)
Creatinine, Ser: 0.88 mg/dL (ref 0.44–1.00)
Glucose, Bld: 98 mg/dL (ref 65–99)
POTASSIUM: 4.5 mmol/L (ref 3.5–5.1)
Sodium: 134 mmol/L — ABNORMAL LOW (ref 135–145)
TOTAL PROTEIN: 7.3 g/dL (ref 6.5–8.1)

## 2017-01-21 LAB — PHOSPHORUS: Phosphorus: 2 mg/dL — ABNORMAL LOW (ref 2.5–4.6)

## 2017-01-21 LAB — VITAMIN B12: Vitamin B-12: 252 pg/mL (ref 180–914)

## 2017-01-21 LAB — POTASSIUM: POTASSIUM: 4.7 mmol/L (ref 3.5–5.1)

## 2017-01-21 MED ORDER — THIAMINE HCL 100 MG/ML IJ SOLN
500.0000 mg | INTRAMUSCULAR | Status: AC
  Administered 2017-01-21 – 2017-01-23 (×3): 500 mg via INTRAVENOUS
  Filled 2017-01-21 (×3): qty 5

## 2017-01-21 MED ORDER — DEXTROSE-NACL 5-0.45 % IV SOLN
INTRAVENOUS | Status: DC
  Administered 2017-01-21 – 2017-01-23 (×4): via INTRAVENOUS

## 2017-01-21 MED ORDER — SODIUM GLYCEROPHOSPHATE 1 MMOLE/ML IV SOLN
10.0000 mmol | Freq: Once | INTRAVENOUS | Status: AC
  Administered 2017-01-21: 11:00:00 10 mmol via INTRAVENOUS
  Filled 2017-01-21: qty 10

## 2017-01-21 MED ORDER — K PHOS MONO-SOD PHOS DI & MONO 155-852-130 MG PO TABS
500.0000 mg | ORAL_TABLET | ORAL | Status: DC
  Filled 2017-01-21 (×2): qty 2

## 2017-01-21 NOTE — Progress Notes (Addendum)
MEDICATION RELATED CONSULT NOTE - Follow up  Pharmacy Consult for electrolyte replacement Indication: hypokalemia, hypomagnesemia  No Known Allergies  Patient Measurements: Height: 5\' 4"  (162.6 cm) Weight: 189 lb 4 oz (85.8 kg) IBW/kg (Calculated) : 54.7 Adjusted Body Weight:   Vital Signs: Temp: 98.5 F (36.9 C) (12/02 0447) Temp Source: Oral (12/02 0447) BP: 176/93 (12/02 0447) Pulse Rate: 111 (12/02 0447) Intake/Output from previous day: 12/01 0701 - 12/02 0700 In: 3161.7 [I.V.:251.7; IV Piggyback:2100] Out: 1250 [Urine:1250] Intake/Output from this shift: No intake/output data recorded.  Labs: Recent Labs    01/20/17 1423 01/21/17 0614  WBC 2.4* 5.6  HGB 8.6* 12.3  HCT 26.0* 36.9  PLT 53* 70*  CREATININE 0.64 0.88  MG 0.9* 3.0*  PHOS  --  2.0*  ALBUMIN 2.3* 3.3*  PROT 4.8* 7.3  AST 17 27  ALT 9* 10*  ALKPHOS 49 69  BILITOT 1.2 1.8*  BILIDIR 0.4  --   IBILI 0.8  --    Estimated Creatinine Clearance: 71.1 mL/min (by C-G formula based on SCr of 0.88 mg/dL).   Microbiology: No results found for this or any previous visit (from the past 720 hour(s)).  Medical History: Past Medical History:  Diagnosis Date  . COPD (chronic obstructive pulmonary disease) ([place])   . Depression   . GERD (gastroesophageal reflux disease)   . History of CT scan 04/1998   small lacunar infarcts  . History of MRI of brain and brain stem 07/1998   white matter changes  . Hx of transient ischemic attack (TIA) 2000   Hosp ?  [facility]land Kitchen Hypertension   . Hypothyroidism   . Infertility, female   . Psoriasis    on humira  . Vasculitis ([place])    h/o Henoch-Scholein Purpura    Medications:  Infusions:  . dextrose 5 % and 0.45 % NaCl with KCl 40 mEq/L 100 mL/hr at 01/21/17 0229    Assessment: 39 yof cc FTT, PMH depression, GERD, HTN, hypothyroidism, vasculitis, EtOH SUD. HPI generalized weakness and confusion per husband. Recently stopped drinking with naltrexone and Librium. Severe  electrolyte abnormalities noted in ED (K 2.4, Mg 0.9, Ca 6.4, albumin 2.3, adjusted calcium 7.7). Pharmacy consulted to manage electrolyte replacement.   Goal of Therapy:  K 3.5 to 5 (goal > 4) Magnesium 1.7 to 2.4 (goal >2) Calcium 8.9 to 10.3 Phosphorus 2.5 to 4.9   12/1: 1. Potassium chloride 10 mEq IV Q1H x 6 doses (per EDP); will give additonal KCl 10 mEq IV Q1H x 2 doses for a total of 80 mEq IV 2. Magnesium 4 gm IV x 1 (per hospitalist); will give additional magnesium 4 gm IV x 1 for a total of 8 gm IV 3. Calcium gluconate 2 gm IV x 1 (per hospitalist) 4. Recheck all electrolytes with AM labs  Plan:  K 4.5, Mag 3.0, Phos 2.0, Ca 9.2- low phos, high mag Will d/c mag oxide for now K phos neutral tab 500 mg PO q4h x3 doses  Recheck K at 1800 to check WNL and recheck all electrolytes in AM  Angelina Neece, Lavonda Jumbo, Pharm.D., BCPS Clinical Pharmacist 01/21/2017,7:57 AM   Addendum: Pt cannot take PO phos per RN will change to Glycophos 10 mmol IV x1  Rayna Sexton, PharmD, BCPS Clinical Pharmacist 01/21/2017 8:54 AM

## 2017-01-21 NOTE — Plan of Care (Signed)
Patient lethargic and not interactive this shift. Patient will say huh? Or yes to name, but does not open eyes or follow commands. Patient had cervical collar on from previous neck fractures, repositioned as tolerated. Patient did not eat anything this shift and not able to take any medications by mouth due to decrease in responsiveness. MD aware of patient status and medication routes changed as needed. Patient's husband in to visit this afternoon and and states that patient has been in this state since admission. Patient does not appear to be in any pain; patient has foley,checked for bowel incontinence. Checked with husband prior to administration of Flu vaccination - pt has not received yet this season. Vaccination administered, no signs or symptoms of adverse reaction noted at this time.

## 2017-01-21 NOTE — Progress Notes (Signed)
MEDICATION RELATED CONSULT NOTE - Follow up  Pharmacy Consult for electrolyte replacement Indication: hypokalemia, hypomagnesemia  No Known Allergies  Patient Measurements: Height: 5\' 4"  (162.6 cm) Weight: 189 lb 4 oz (85.8 kg) IBW/kg (Calculated) : 54.7 Adjusted Body Weight:   Vital Signs: Temp: 101.2 F (38.4 C) (12/02 1838) Temp Source: Axillary (12/02 1838) BP: 157/94 (12/02 1838) Pulse Rate: 105 (12/02 1838) Intake/Output from previous day: 12/01 0701 - 12/02 0700 In: 3161.7 [I.V.:251.7; IV Piggyback:2100] Out: 1250 [Urine:1250] Intake/Output from this shift: Total I/O In: 1710 [I.V.:900; Other:500; IV Piggyback:310] Out: [place]: Recent Labs    01/20/17 1423 01/21/17 0614  WBC 2.4* 5.6  HGB 8.6* 12.3  HCT 26.0* 36.9  PLT 53* 70*  CREATININE 0.64 0.88  MG 0.9* 3.0*  PHOS  --  2.0*  ALBUMIN 2.3* 3.3*  PROT 4.8* 7.3  AST 17 27  ALT 9* 10*  ALKPHOS 49 69  BILITOT 1.2 1.8*  BILIDIR 0.4  --   IBILI 0.8  --    Estimated Creatinine Clearance: 71.1 mL/min (by C-G formula based on SCr of 0.88 mg/dL).   Microbiology: No results found for this or any previous visit (from the past 720 hour(s)).  Medical History: Past Medical History:  Diagnosis Date  . COPD (chronic obstructive pulmonary disease) ([place])   . Depression   . GERD (gastroesophageal reflux disease)   . History of CT scan 04/1998   small lacunar infarcts  . History of MRI of brain and brain stem 07/1998   white matter changes  . Hx of transient ischemic attack (TIA) 2000   Hosp ?  Marland Kitchen Hypertension   . Hypothyroidism   . Infertility, female   . Psoriasis    on humira  . 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 dextrose 5 % and 0.45 % NaCl with KCl 40 mEq/L 100 mL/hr at 01/21/17 1213  . sodium glycerophosphate 0.9% NaCl IVPB 10 mmol (01/21/17 1115)  . thiamine injection Stopped (01/21/17 1250)    Assessment: 81 yof cc FTT, PMH  depression, GERD, HTN, hypothyroidism, vasculitis, EtOH SUD. HPI generalized weakness and confusion per husband. Recently stopped drinking with naltrexone and Librium. Severe electrolyte abnormalities noted in ED (K 2.4, Mg 0.9, Ca 6.4, albumin 2.3, adjusted calcium 7.7). Pharmacy consulted to manage electrolyte replacement.   Goal of Therapy:  K 3.5 to 5 (goal > 4) Magnesium 1.7 to 2.4 (goal >2) Calcium 8.9 to 10.3 Phosphorus 2.5 to 4.9   12/1: 1. Potassium chloride 10 mEq IV Q1H x 6 doses (per EDP); will give additonal KCl 10 mEq IV Q1H x 2 doses for a total of 80 mEq IV 2. Magnesium 4 gm IV x 1 (per hospitalist); will give additional magnesium 4 gm IV x 1 for a total of 8 gm IV 3. Calcium gluconate 2 gm IV x 1 (per hospitalist) 4. Recheck all electrolytes with AM labs  Plan:  K 4.5, Mag 3.0, Phos 2.0, Ca 9.2- low phos, high mag Will d/c mag oxide for now K phos neutral tab 500 mg PO q4h x3 doses  Recheck K at 1800 to check WNL and recheck all electrolytes in AM  01/21/17 1818 K 4.7. No further supplement indicated at this time. Will remove KCl from IVF per Dr. Posey Pronto.   Laural Benes, Pharm.D., BCPS Clinical Pharmacist 01/21/2017,6:56 PM

## 2017-01-21 NOTE — Progress Notes (Signed)
Patient still lethargic, change in resp effort and pattern noted. Patient appears to be slightly tugging when breathing and also puttering/blowing out when exhaling. Patient repositioned in bed; MD paged and Resp to bedside to assess. MD order received for STAT ABG.

## 2017-01-21 NOTE — Progress Notes (Signed)
[name]    MR#:  [id number]  DATE OF BIRTH:  1955-06-12  SUBJECTIVE:   Patient not awake this am  He is lethargic and not waking up  REVIEW OF SYSTEMS:    Unable to obtain as patient is lethargic   Tolerating Diet: Lethargic      DRUG ALLERGIES:  No Known Allergies  VITALS:  Blood pressure (!) 154/91, pulse (!) 106, temperature 98.9 F (37.2 C), temperature source Axillary, resp. rate (!) 24, height 5\' 4"  (1.626 m), weight 85.8 kg (189 lb 4 oz), SpO2 94 %.  PHYSICAL EXAMINATION:  Constitutional: Appears chronically ill appearing she is lethargic somnolent She has a cervical collar placed or just chronic HENT: Normocephalic. Marland Kitchen Oropharynx is clear and moist.  Eyes: Conjunctivae and EOM are normal. PERRLA, no scleral icterus.  Neck: Normal ROM. Neck supple. No JVD. No tracheal deviation. CVS: RRR, S1/S2 +, no murmurs, no gallops, no carotid bruit.  Pulmonary: Effort and breath sounds normal, no stridor, rhonchi, wheezes, rales.  Abdominal: Soft. BS +,  no distension, tenderness, rebound or guarding.  Musculoskeletal:  No edema and no tenderness.  Neuro: Somnolent Skin: Also bruises noted  Psychiatric: Somnolent    LABORATORY PANEL:   CBC Recent Labs  Lab 01/21/17 0614  WBC 5.6  HGB 12.3  HCT 36.9  PLT 70*   ------------------------------------------------------------------------------------------------------------------  Chemistries  Recent Labs  Lab 01/21/17 0614  NA 134*  K 4.5  CL 103  CO2 21*  GLUCOSE 98  BUN 8  CREATININE 0.88  CALCIUM 9.2  MG 3.0*  AST 27  ALT 10*  ALKPHOS 69  BILITOT 1.8*   ------------------------------------------------------------------------------------------------------------------  Cardiac Enzymes Recent Labs  Lab 01/20/17 1423  TROPONINI <0.03    ------------------------------------------------------------------------------------------------------------------  RADIOLOGY:  Ct Head Wo Contrast  Result Date: 01/20/2017 CLINICAL DATA:  Fall and bruising.  Initial encounter. EXAM: CT HEAD WITHOUT CONTRAST CT MAXILLOFACIAL WITHOUT CONTRAST CT CERVICAL SPINE WITHOUT CONTRAST TECHNIQUE: Multidetector CT imaging of the head, cervical spine, and maxillofacial structures were performed using the standard protocol without intravenous contrast. Multiplanar CT image reconstructions of the cervical spine and maxillofacial structures were also generated. COMPARISON:  01/03/2017 FINDINGS: CT HEAD FINDINGS Brain: No evidence of acute infarction, hemorrhage, hydrocephalus, extra-axial collection or mass lesion/mass effect. Advanced chronic small vessel ischemia with white matter gliosis that is confluent. There have been remote lacunar infarcts in the left pons, left thalamus, and bilateral basal ganglia. 5 mm colloid cyst, best seen on coronal reformats. Vascular: Atherosclerotic calcification.  No hyperdense vessel. Skull: No acute finding CT MAXILLOFACIAL FINDINGS Osseous: Negative for fracture or mandibular dislocation. Orbits: No evidence of injury. Sinuses: No evidence of injury. No hemosinus. Anterior nasal septal perforation. Soft tissues: Contusion and hematoma along the left cheek. CT CERVICAL SPINE FINDINGS Alignment: Chronic anterolisthesis at C3-4 and C4-5 measuring up to 5 mm. Milder anterolisthesis at C7-T1. Cervical kyphosis. No acute malalignment. Skull base and vertebrae: Remote C1 bilateral posterior arch fractures. Remote left anterior arch and lateral mass fracture of C1. Remote left C4 and C5 facet fractures without displacement. No acute fracture. Soft tissues and spinal canal: No prevertebral fluid or swelling. No visible canal hematoma. Disc levels: Diffuse degenerative disc narrowing. Cervical facet arthropathy from C2-3 to C5-6. Upper chest:  No acute finding IMPRESSION: Head CT: 1. No evidence of intracranial injury. 2. Extensive chronic small vessel ischemic injury. 3. 5 mm colloid cyst.  No hydrocephalus. Cervical spine  CT: 1. Known remote C1 ring and left lateral mass fractures. Known remote left C4 and C5 facet fractures. No acute fracture or acute malalignment. 2. Advanced disc and facet degeneration with chronic C3-4 and C4-5 anterolisthesis. Maxillofacial CT: Contusion over the left cheek. No fracture or mandibular dislocation. Electronically Signed   By: Monte Fantasia M.D.   On: 01/20/2017 17:10   Ct Cervical Spine Wo Contrast  Result Date: 01/20/2017 CLINICAL DATA:  Fall and bruising.  Initial encounter. EXAM: CT HEAD WITHOUT CONTRAST CT MAXILLOFACIAL WITHOUT CONTRAST CT CERVICAL SPINE WITHOUT CONTRAST TECHNIQUE: Multidetector CT imaging of the head, cervical spine, and maxillofacial structures were performed using the standard protocol without intravenous contrast. Multiplanar CT image reconstructions of the cervical spine and maxillofacial structures were also generated. COMPARISON:  01/03/2017 FINDINGS: CT HEAD FINDINGS Brain: No evidence of acute infarction, hemorrhage, hydrocephalus, extra-axial collection or mass lesion/mass effect. Advanced chronic small vessel ischemia with white matter gliosis that is confluent. There have been remote lacunar infarcts in the left pons, left thalamus, and bilateral basal ganglia. 5 mm colloid cyst, best seen on coronal reformats. Vascular: Atherosclerotic calcification.  No hyperdense vessel. Skull: No acute finding CT MAXILLOFACIAL FINDINGS Osseous: Negative for fracture or mandibular dislocation. Orbits: No evidence of injury. Sinuses: No evidence of injury. No hemosinus. Anterior nasal septal perforation. Soft tissues: Contusion and hematoma along the left cheek. CT CERVICAL SPINE FINDINGS Alignment: Chronic anterolisthesis at C3-4 and C4-5 measuring up to 5 mm. Milder anterolisthesis at C7-T1.  Cervical kyphosis. No acute malalignment. Skull base and vertebrae: Remote C1 bilateral posterior arch fractures. Remote left anterior arch and lateral mass fracture of C1. Remote left C4 and C5 facet fractures without displacement. No acute fracture. Soft tissues and spinal canal: No prevertebral fluid or swelling. No visible canal hematoma. Disc levels: Diffuse degenerative disc narrowing. Cervical facet arthropathy from C2-3 to C5-6. Upper chest: No acute finding IMPRESSION: Head CT: 1. No evidence of intracranial injury. 2. Extensive chronic small vessel ischemic injury. 3. 5 mm colloid cyst.  No hydrocephalus. Cervical spine CT: 1. Known remote C1 ring and left lateral mass fractures. Known remote left C4 and C5 facet fractures. No acute fracture or acute malalignment. 2. Advanced disc and facet degeneration with chronic C3-4 and C4-5 anterolisthesis. Maxillofacial CT: Contusion over the left cheek. No fracture or mandibular dislocation. Electronically Signed   By: Monte Fantasia M.D.   On: 01/20/2017 17:10   Ct Abdomen Pelvis W Contrast  Result Date: 01/20/2017 CLINICAL DATA:  61 year old female with acute abdominal and pelvic pain and distention. EXAM: CT ABDOMEN AND PELVIS WITH CONTRAST TECHNIQUE: Multidetector CT imaging of the abdomen and pelvis was performed using the standard protocol following bolus administration of intravenous contrast. CONTRAST:  135mL ISOVUE-300 IOPAMIDOL (ISOVUE-300) INJECTION 61% COMPARISON:  05/25/2011 CTA FINDINGS: Lower chest: No acute abnormality. Hepatobiliary: Cirrhosis identified. No hepatic lesions are present cholelithiasis identified without definite CT evidence of acute cholecystitis. No biliary dilatation. Pancreas: Unremarkable Spleen: Moderate splenomegaly has increased since 2013, now with splenic volume of 150 cc. Adrenals/Urinary Tract: The kidneys, adrenal glands and bladder are unremarkable. Stomach/Bowel: Stomach is within normal limits. Appendix appears  normal. No evidence of bowel wall thickening, distention, or inflammatory changes. Vascular/Lymphatic: Aortic atherosclerosis. No enlarged abdominal or pelvic lymph nodes. Reproductive: Status post hysterectomy. No adnexal masses. Other: No ascites, focal collection or pneumoperitoneum. Musculoskeletal: No acute bony abnormality or suspicious lesion. IMPRESSION: 1. No evidence of acute abnormality. 2. Cholelithiasis without CT evidence of acute cholecystitis. Consider ultrasound  or nuclear medicine study if there is strong clinical suspicion for acute cholecystitis. 3. Cirrhosis. 4. Moderate splenomegaly, increased since 2013. 5.  Aortic Atherosclerosis (ICD10-I70.0). Electronically Signed   By: Margarette Canada M.D.   On: 01/20/2017 17:06   Ct Maxillofacial Wo Contrast  Result Date: 01/20/2017 CLINICAL DATA:  Fall and bruising.  Initial encounter. EXAM: CT HEAD WITHOUT CONTRAST CT MAXILLOFACIAL WITHOUT CONTRAST CT CERVICAL SPINE WITHOUT CONTRAST TECHNIQUE: Multidetector CT imaging of the head, cervical spine, and maxillofacial structures were performed using the standard protocol without intravenous contrast. Multiplanar CT image reconstructions of the cervical spine and maxillofacial structures were also generated. COMPARISON:  01/03/2017 FINDINGS: CT HEAD FINDINGS Brain: No evidence of acute infarction, hemorrhage, hydrocephalus, extra-axial collection or mass lesion/mass effect. Advanced chronic small vessel ischemia with white matter gliosis that is confluent. There have been remote lacunar infarcts in the left pons, left thalamus, and bilateral basal ganglia. 5 mm colloid cyst, best seen on coronal reformats. Vascular: Atherosclerotic calcification.  No hyperdense vessel. Skull: No acute finding CT MAXILLOFACIAL FINDINGS Osseous: Negative for fracture or mandibular dislocation. Orbits: No evidence of injury. Sinuses: No evidence of injury. No hemosinus. Anterior nasal septal perforation. Soft tissues: Contusion  and hematoma along the left cheek. CT CERVICAL SPINE FINDINGS Alignment: Chronic anterolisthesis at C3-4 and C4-5 measuring up to 5 mm. Milder anterolisthesis at C7-T1. Cervical kyphosis. No acute malalignment. Skull base and vertebrae: Remote C1 bilateral posterior arch fractures. Remote left anterior arch and lateral mass fracture of C1. Remote left C4 and C5 facet fractures without displacement. No acute fracture. Soft tissues and spinal canal: No prevertebral fluid or swelling. No visible canal hematoma. Disc levels: Diffuse degenerative disc narrowing. Cervical facet arthropathy from C2-3 to C5-6. Upper chest: No acute finding IMPRESSION: Head CT: 1. No evidence of intracranial injury. 2. Extensive chronic small vessel ischemic injury. 3. 5 mm colloid cyst.  No hydrocephalus. Cervical spine CT: 1. Known remote C1 ring and left lateral mass fractures. Known remote left C4 and C5 facet fractures. No acute fracture or acute malalignment. 2. Advanced disc and facet degeneration with chronic C3-4 and C4-5 anterolisthesis. Maxillofacial CT: Contusion over the left cheek. No fracture or mandibular dislocation. Electronically Signed   By: Monte Fantasia M.D.   On: 01/20/2017 17:10     ASSESSMENT AND PLAN:   61 year old female with history of EtOH dependence, cervical fractures with cervical spine collar place and hypothyroidism who presents with acute encephalopathy.  1. Acute metabolic encephalopathy in the setting of electrolyte abnormalities, chronic alcoholism, cerebral atrophy and adult failure to thrive Check thiamine level Start IV thiamine for possible Wernickes for 3 days. Hold Wellbutrin for now  2. Electrolyte abnormalities: These have been repleted and will be checked on a daily basis.  3. Generalized weakness due to chronic alcoholism and adult failure to thrive with frequent falls Physical therapy consultation once patient is awake and alert  4. EtOH abuse: Patient stopped drinking EtOH  about a week ago and was prescribed naltrexone and Librium for withdrawal symptoms by her primary care physician.  5. 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  CODE STATUS: full  TOTAL TIME TAKING CARE OF THIS PATIENT: 30 minutes.     POSSIBLE D/C 2-4 days, DEPENDING ON CLINICAL CONDITION.   Krish Bailly M.D on 01/21/2017 at 9:46 AM  Between 7am to 6pm - Pager - [phone] After 6pm go to [email or web address] -  password [email or web address] [facility]  Office  [phone]  CC: Primary care physician; Tower, Wynelle Fanny, MD  Note: This dictation was prepared with Dragon dictation along with smaller phrase technology. Any transcriptional errors that result from this process are unintentional.

## 2017-01-21 NOTE — Progress Notes (Signed)
PT Cancellation Note  Patient Details Name: JAMISHA HOESCHEN MRN: [id number] DOB: 1955/08/11   Cancelled Treatment:    Reason Eval/Treat Not Completed: Fatigue/lethargy limiting ability to participate;Patient declined, no reason specified;Patient's level of consciousness   Alanson Puls, PT DPT 01/21/2017, 11:48 AM

## 2017-01-22 ENCOUNTER — Inpatient Hospital Stay (HOSPITAL_COMMUNITY): Payer: Medicare HMO

## 2017-01-22 ENCOUNTER — Inpatient Hospital Stay: Payer: Medicare HMO

## 2017-01-22 DIAGNOSIS — G9340 Encephalopathy, unspecified: Secondary | ICD-10-CM

## 2017-01-22 LAB — BASIC METABOLIC PANEL
Anion gap: 9 (ref 5–15)
BUN: 9 mg/dL (ref 6–20)
CALCIUM: 8.6 mg/dL — AB (ref 8.9–10.3)
CO2: 21 mmol/L — ABNORMAL LOW (ref 22–32)
CREATININE: 0.83 mg/dL (ref 0.44–1.00)
Chloride: 102 mmol/L (ref 101–111)
GFR calc non Af Amer: >60 mL/min (ref >60)
Glucose, Bld: 91 mg/dL (ref 65–99)
Potassium: 4.7 mmol/L (ref 3.5–5.1)
SODIUM: 132 mmol/L — AB (ref 135–145)

## 2017-01-22 LAB — CBC WITH DIFFERENTIAL/PLATELET
BASOS ABS: 0 10*3/uL (ref 0–0.1)
BASOS PCT: 1 %
EOS PCT: 1 %
Eosinophils Absolute: 0.1 10*3/uL (ref 0–0.7)
HCT: 35.6 % (ref 35.0–47.0)
Hemoglobin: 11.9 g/dL — ABNORMAL LOW (ref 12.0–16.0)
Lymphocytes Relative: 13 %
Lymphs Abs: 1.1 10*3/uL (ref 1.0–3.6)
MCH: 33.6 pg (ref 26.0–34.0)
MCHC: 33.6 g/dL (ref 32.0–36.0)
MCV: 100.1 fL — AB (ref 80.0–100.0)
MONO ABS: 1.2 10*3/uL — AB (ref 0.2–0.9)
MONOS PCT: 14 %
Neutro Abs: 6.2 10*3/uL (ref 1.4–6.5)
Neutrophils Relative %: 71 %
PLATELETS: 64 10*3/uL — AB (ref 150–440)
RBC: 3.55 MIL/uL — ABNORMAL LOW (ref 3.80–5.20)
RDW: 14.8 % — AB (ref 11.5–14.5)
WBC: 8.7 10*3/uL (ref 3.6–11.0)

## 2017-01-22 LAB — PHOSPHORUS: Phosphorus: 2.9 mg/dL (ref 2.5–4.6)

## 2017-01-22 LAB — MAGNESIUM: MAGNESIUM: 1.7 mg/dL (ref 1.7–2.4)

## 2017-01-22 MED ORDER — DEXTROSE 5 % IV SOLN
10.0000 mg/kg | Freq: Three times a day (TID) | INTRAVENOUS | Status: DC
  Administered 2017-01-22 – 2017-01-25 (×9): 670 mg via INTRAVENOUS
  Filled 2017-01-22 (×12): qty 13.4

## 2017-01-22 MED ORDER — VANCOMYCIN HCL 10 G IV SOLR
1250.0000 mg | Freq: Once | INTRAVENOUS | Status: AC
  Administered 2017-01-22: 1250 mg via INTRAVENOUS
  Filled 2017-01-22: qty 1250

## 2017-01-22 MED ORDER — CYANOCOBALAMIN 1000 MCG/ML IJ SOLN
1000.0000 ug | Freq: Every day | INTRAMUSCULAR | Status: DC
  Administered 2017-01-22 – 2017-01-24 (×3): 1000 ug via SUBCUTANEOUS
  Filled 2017-01-22 (×4): qty 1

## 2017-01-22 MED ORDER — VANCOMYCIN HCL 10 G IV SOLR
1250.0000 mg | INTRAVENOUS | Status: DC
  Administered 2017-01-23 – 2017-01-24 (×3): 1250 mg via INTRAVENOUS
  Filled 2017-01-22 (×4): qty 1250

## 2017-01-22 MED ORDER — MAGNESIUM SULFATE 2 GM/50ML IV SOLN
2.0000 g | Freq: Once | INTRAVENOUS | Status: AC
  Administered 2017-01-22: 11:00:00 2 g via INTRAVENOUS
  Filled 2017-01-22: qty 50

## 2017-01-22 MED ORDER — DEXTROSE 5 % IV SOLN
2.0000 g | Freq: Two times a day (BID) | INTRAVENOUS | Status: DC
Start: 2017-01-22 — End: 2017-01-25
  Administered 2017-01-22 – 2017-01-24 (×6): 2 g via INTRAVENOUS
  Filled 2017-01-22 (×8): qty 2

## 2017-01-22 NOTE — Progress Notes (Signed)
[facility] [place] NAME: Stephanie Raymond    MR#:  [id number]  DATE OF BIRTH:  06-Aug-1955  SUBJECTIVE:   She is lethargic.  Opens eyes and moans at times.  Husband at bedside.  Patient has been progressively declining over the past few months and worse over the last 3 weeks with gait abnormalities, confusion and has been wheelchair-bound. Has continued to drink through this.  REVIEW OF SYSTEMS:    Unable to obtain as patient is lethargic  DRUG ALLERGIES:  No Known Allergies  VITALS:  Blood pressure (!) 156/85, pulse (!) 113, temperature (!) 101.1 F (38.4 C), temperature source Oral, resp. rate 20, height 5\' 4"  (1.626 m), weight 85.8 kg (189 lb 4 oz), SpO2 95 %.  PHYSICAL EXAMINATION:  Constitutional: Appears chronically ill appearing she is lethargic somnolent She has a cervical collar placed HENT: Normocephalic. Marland Kitchen Oropharynx is clear and moist.  Eyes: Conjunctivae and EOM are normal. PERRLA, no scleral icterus.  Neck: Normal ROM. Neck supple. No JVD. No tracheal deviation. CVS: RRR, S1/S2 +, no murmurs, no gallops, no carotid bruit.  Pulmonary: Effort and breath sounds normal, no stridor, rhonchi, wheezes, rales.  Abdominal: Soft. BS +,  no distension, tenderness, rebound or guarding.  Musculoskeletal:  No edema and no tenderness.  Neuro: Somnolent Skin: Also bruises noted  Psychiatric: Somnolent  LABORATORY PANEL:   CBC Recent Labs  Lab 01/22/17 0447  WBC 8.7  HGB 11.9*  HCT 35.6  PLT 64*   ------------------------------------------------------------------------------------------------------------------  Chemistries  Recent Labs  Lab 01/21/17 0614  01/22/17 0435  NA 134*  --  132*  K 4.5   < > 4.7  CL 103  --  102  CO2 21*  --  21*  GLUCOSE 98  --  91  BUN 8  --  9  CREATININE 0.88  --  0.83  CALCIUM 9.2  --  8.6*  MG 3.0*  --  1.7  AST 27  --   --   ALT 10*  --   --   ALKPHOS 69  --   --   BILITOT  1.8*  --   --    < > = values in this interval not displayed.   ------------------------------------------------------------------------------------------------------------------  Cardiac Enzymes Recent Labs  Lab 01/20/17 1423  TROPONINI <0.03   ------------------------------------------------------------------------------------------------------------------  RADIOLOGY:  Ct Head Wo Contrast  Result Date: 01/20/2017 CLINICAL DATA:  Fall and bruising.  Initial encounter. EXAM: CT HEAD WITHOUT CONTRAST CT MAXILLOFACIAL WITHOUT CONTRAST CT CERVICAL SPINE WITHOUT CONTRAST TECHNIQUE: Multidetector CT imaging of the head, cervical spine, and maxillofacial structures were performed using the standard protocol without intravenous contrast. Multiplanar CT image reconstructions of the cervical spine and maxillofacial structures were also generated. COMPARISON:  01/03/2017 FINDINGS: CT HEAD FINDINGS Brain: No evidence of acute infarction, hemorrhage, hydrocephalus, extra-axial collection or mass lesion/mass effect. Advanced chronic small vessel ischemia with white matter gliosis that is confluent. There have been remote lacunar infarcts in the left pons, left thalamus, and bilateral basal ganglia. 5 mm colloid cyst, best seen on coronal reformats. Vascular: Atherosclerotic calcification.  No hyperdense vessel. Skull: No acute finding CT MAXILLOFACIAL FINDINGS Osseous: Negative for fracture or mandibular dislocation. Orbits: No evidence of injury. Sinuses: No evidence of injury. No hemosinus. Anterior nasal septal perforation. Soft tissues: Contusion and hematoma along the left cheek. CT CERVICAL SPINE FINDINGS Alignment: Chronic anterolisthesis at C3-4 and C4-5 measuring up to 5 mm. Milder anterolisthesis at C7-T1. Cervical kyphosis.  No acute malalignment. Skull base and vertebrae: Remote C1 bilateral posterior arch fractures. Remote left anterior arch and lateral mass fracture of C1. Remote left C4 and C5 facet  fractures without displacement. No acute fracture. Soft tissues and spinal canal: No prevertebral fluid or swelling. No visible canal hematoma. Disc levels: Diffuse degenerative disc narrowing. Cervical facet arthropathy from C2-3 to C5-6. Upper chest: No acute finding IMPRESSION: Head CT: 1. No evidence of intracranial injury. 2. Extensive chronic small vessel ischemic injury. 3. 5 mm colloid cyst.  No hydrocephalus. Cervical spine CT: 1. Known remote C1 ring and left lateral mass fractures. Known remote left C4 and C5 facet fractures. No acute fracture or acute malalignment. 2. Advanced disc and facet degeneration with chronic C3-4 and C4-5 anterolisthesis. Maxillofacial CT: Contusion over the left cheek. No fracture or mandibular dislocation. Electronically Signed   By: Monte Fantasia M.D.   On: 01/20/2017 17:10   Ct Cervical Spine Wo Contrast  Result Date: 01/20/2017 CLINICAL DATA:  Fall and bruising.  Initial encounter. EXAM: CT HEAD WITHOUT CONTRAST CT MAXILLOFACIAL WITHOUT CONTRAST CT CERVICAL SPINE WITHOUT CONTRAST TECHNIQUE: Multidetector CT imaging of the head, cervical spine, and maxillofacial structures were performed using the standard protocol without intravenous contrast. Multiplanar CT image reconstructions of the cervical spine and maxillofacial structures were also generated. COMPARISON:  01/03/2017 FINDINGS: CT HEAD FINDINGS Brain: No evidence of acute infarction, hemorrhage, hydrocephalus, extra-axial collection or mass lesion/mass effect. Advanced chronic small vessel ischemia with white matter gliosis that is confluent. There have been remote lacunar infarcts in the left pons, left thalamus, and bilateral basal ganglia. 5 mm colloid cyst, best seen on coronal reformats. Vascular: Atherosclerotic calcification.  No hyperdense vessel. Skull: No acute finding CT MAXILLOFACIAL FINDINGS Osseous: Negative for fracture or mandibular dislocation. Orbits: No evidence of injury. Sinuses: No evidence  of injury. No hemosinus. Anterior nasal septal perforation. Soft tissues: Contusion and hematoma along the left cheek. CT CERVICAL SPINE FINDINGS Alignment: Chronic anterolisthesis at C3-4 and C4-5 measuring up to 5 mm. Milder anterolisthesis at C7-T1. Cervical kyphosis. No acute malalignment. Skull base and vertebrae: Remote C1 bilateral posterior arch fractures. Remote left anterior arch and lateral mass fracture of C1. Remote left C4 and C5 facet fractures without displacement. No acute fracture. Soft tissues and spinal canal: No prevertebral fluid or swelling. No visible canal hematoma. Disc levels: Diffuse degenerative disc narrowing. Cervical facet arthropathy from C2-3 to C5-6. Upper chest: No acute finding IMPRESSION: Head CT: 1. No evidence of intracranial injury. 2. Extensive chronic small vessel ischemic injury. 3. 5 mm colloid cyst.  No hydrocephalus. Cervical spine CT: 1. Known remote C1 ring and left lateral mass fractures. Known remote left C4 and C5 facet fractures. No acute fracture or acute malalignment. 2. Advanced disc and facet degeneration with chronic C3-4 and C4-5 anterolisthesis. Maxillofacial CT: Contusion over the left cheek. No fracture or mandibular dislocation. Electronically Signed   By: Monte Fantasia M.D.   On: 01/20/2017 17:10   Ct Abdomen Pelvis W Contrast  Result Date: 01/20/2017 CLINICAL DATA:  61 year old female with acute abdominal and pelvic pain and distention. EXAM: CT ABDOMEN AND PELVIS WITH CONTRAST TECHNIQUE: Multidetector CT imaging of the abdomen and pelvis was performed using the standard protocol following bolus administration of intravenous contrast. CONTRAST:  116mL ISOVUE-300 IOPAMIDOL (ISOVUE-300) INJECTION 61% COMPARISON:  05/25/2011 CTA FINDINGS: Lower chest: No acute abnormality. Hepatobiliary: Cirrhosis identified. No hepatic lesions are present cholelithiasis identified without definite CT evidence of acute cholecystitis. No biliary dilatation. Pancreas:  Unremarkable Spleen: Moderate splenomegaly has increased since 2013, now with splenic volume of 150 cc. Adrenals/Urinary Tract: The kidneys, adrenal glands and bladder are unremarkable. Stomach/Bowel: Stomach is within normal limits. Appendix appears normal. No evidence of bowel wall thickening, distention, or inflammatory changes. Vascular/Lymphatic: Aortic atherosclerosis. No enlarged abdominal or pelvic lymph nodes. Reproductive: Status post hysterectomy. No adnexal masses. Other: No ascites, focal collection or pneumoperitoneum. Musculoskeletal: No acute bony abnormality or suspicious lesion. IMPRESSION: 1. No evidence of acute abnormality. 2. Cholelithiasis without CT evidence of acute cholecystitis. Consider ultrasound or nuclear medicine study if there is strong clinical suspicion for acute cholecystitis. 3. Cirrhosis. 4. Moderate splenomegaly, increased since 2013. 5.  Aortic Atherosclerosis (ICD10-I70.0). Electronically Signed   By: Margarette Canada M.D.   On: 01/20/2017 17:06   Dg Chest Port 1 View  Result Date: 01/22/2017 CLINICAL DATA:  Possible aspiration.  Hypoxia. EXAM: PORTABLE CHEST 1 VIEW COMPARISON:  Chest radiograph 12/24/2013. FINDINGS: Normal cardiomediastinal silhouette. Tortuous thoracic aorta. Chronic elevation RIGHT hemidiaphragm. Small RIGHT effusion. There may be mild basilar atelectasis on the RIGHT, but no definite consolidation. I see no pneumothorax. Bones are unremarkable. IMPRESSION: Chronic elevation RIGHT hemidiaphragm. There may be a small effusion and mild basilar atelectasis on the RIGHT. No definite consolidation. Electronically Signed   By: Staci Righter M.D.   On: 01/22/2017 09:47   Ct Maxillofacial Wo Contrast  Result Date: 01/20/2017 CLINICAL DATA:  Fall and bruising.  Initial encounter. EXAM: CT HEAD WITHOUT CONTRAST CT MAXILLOFACIAL WITHOUT CONTRAST CT CERVICAL SPINE WITHOUT CONTRAST TECHNIQUE: Multidetector CT imaging of the head, cervical spine, and maxillofacial  structures were performed using the standard protocol without intravenous contrast. Multiplanar CT image reconstructions of the cervical spine and maxillofacial structures were also generated. COMPARISON:  01/03/2017 FINDINGS: CT HEAD FINDINGS Brain: No evidence of acute infarction, hemorrhage, hydrocephalus, extra-axial collection or mass lesion/mass effect. Advanced chronic small vessel ischemia with white matter gliosis that is confluent. There have been remote lacunar infarcts in the left pons, left thalamus, and bilateral basal ganglia. 5 mm colloid cyst, best seen on coronal reformats. Vascular: Atherosclerotic calcification.  No hyperdense vessel. Skull: No acute finding CT MAXILLOFACIAL FINDINGS Osseous: Negative for fracture or mandibular dislocation. Orbits: No evidence of injury. Sinuses: No evidence of injury. No hemosinus. Anterior nasal septal perforation. Soft tissues: Contusion and hematoma along the left cheek. CT CERVICAL SPINE FINDINGS Alignment: Chronic anterolisthesis at C3-4 and C4-5 measuring up to 5 mm. Milder anterolisthesis at C7-T1. Cervical kyphosis. No acute malalignment. Skull base and vertebrae: Remote C1 bilateral posterior arch fractures. Remote left anterior arch and lateral mass fracture of C1. Remote left C4 and C5 facet fractures without displacement. No acute fracture. Soft tissues and spinal canal: No prevertebral fluid or swelling. No visible canal hematoma. Disc levels: Diffuse degenerative disc narrowing. Cervical facet arthropathy from C2-3 to C5-6. Upper chest: No acute finding IMPRESSION: Head CT: 1. No evidence of intracranial injury. 2. Extensive chronic small vessel ischemic injury. 3. 5 mm colloid cyst.  No hydrocephalus. Cervical spine CT: 1. Known remote C1 ring and left lateral mass fractures. Known remote left C4 and C5 facet fractures. No acute fracture or acute malalignment. 2. Advanced disc and facet degeneration with chronic C3-4 and C4-5 anterolisthesis.  Maxillofacial CT: Contusion over the left cheek. No fracture or mandibular dislocation. Electronically Signed   By: Monte Fantasia M.D.   On: 01/20/2017 17:10     ASSESSMENT AND PLAN:   61 year old female with history of EtOH dependence, cervical fractures  with cervical spine collar place and hypothyroidism who presents with acute encephalopathy.  * Sepsis Source is unknown.  Does have fever and tachycardia.  WBC normal. Discussed with neurology Dr. Doy Mince.  I have started vancomycin, ceftriaxone and acyclovir to cover for meningitis.  Thrombocytopenia does not allow for LP at this time.  *Acute toxic and metabolic encephalopathy seems multifactorial.  Due to infection, alcohol, cognitive decline due to alcohol. On thiamine and folic acid. EEG pending.  * Generalized weakness due to chronic alcoholism and adult failure to thrive with frequent falls Physical therapy consultation once patient is awake and alert  *Alcohol abuse.  Last drink was 10 days back according to husband.  No suspicion for withdrawals at this time.  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  CODE STATUS: full  TOTAL TIME TAKING CARE OF THIS PATIENT: 30 minutes.   POSSIBLE D/C 2-3 days, DEPENDING ON CLINICAL CONDITION.  Leia Alf Ipek Westra M.D on 01/22/2017 at 2:24 PM  Between 7am to 6pm - Pager - [phone]  After 6pm go to [email or web address] - password [email or web address] [facility]  Office  [phone]  CC: Primary care physician; Tower, Wynelle Fanny, MD  Note: This dictation was prepared with Dragon dictation along with smaller phrase technology. Any transcriptional errors that result from this process are unintentional.

## 2017-01-22 NOTE — Progress Notes (Signed)
PT Cancellation Note  Patient Details Name: TANISHA LUTES MRN: [id number] DOB: 06-28-1955   Cancelled Treatment:    Reason Eval/Treat Not Completed: Fatigue/lethargy limiting ability to participate; Spoke to pt's nurse prior to entering pt's room with nursing requesting no PT services this date secondary to pt's lethargy.  Will attempt to see pt at a future date as appropriate.    Linus Salmons PT, DPT 01/22/17, 11:22 AM

## 2017-01-22 NOTE — Care Management Important Message (Signed)
Important Message  Patient Details  Name: Stephanie Raymond MRN: [id number] Date of Birth: 08/22/1955   Medicare Important Message Given:  Yes    Shelbie Ammons, RN 01/22/2017, 7:22 AM

## 2017-01-22 NOTE — Progress Notes (Signed)
DiscussedAdvance care planning  Met with patient at bedside.  Discussed regarding patient's acute encephalopathy, chronic alcoholism, declining health status over the past few months.  He tells me patient has had some gait abnormalities for many years.  Continues to drink high amounts of tequila.  She had cervical spine fracture 9 weeks back and was transferred to [facility].  Slowly started using a Rollator and now pretty much bedbound over the past few days.  She has continued to drink until she was brought to the hospital.  Today she is extremely drowsy.  We discussed regarding mental status changes due to alcohol.  Treatment.  Advised that we would get neurology consult.  Discussed regarding patient's CODE STATUS and he tells me that she wanted to be DO NOT RESUSCITATE and DO NOT INTUBATE.  Is hopeful she will improved.  Orders entered.  Time spent 20 minutes.

## 2017-01-22 NOTE — Progress Notes (Signed)
MEDICATION RELATED CONSULT NOTE - Follow up  Pharmacy Consult for electrolyte replacement Indication: hypokalemia, hypomagnesemia  No Known Allergies  Patient Measurements: Height: 5\' 4"  (162.6 cm) Weight: 189 lb 4 oz (85.8 kg) IBW/kg (Calculated) : 54.7 Adjusted Body Weight:   Vital Signs: Temp: 100.7 F (38.2 C) (12/03 0517) Temp Source: Axillary (12/03 0517) BP: 169/94 (12/03 0517) Pulse Rate: 102 (12/03 0517) Intake/Output from previous day: 12/02 0701 - 12/03 0700 In: 2406.7 [I.V.:1596.7; IV Piggyback:310] Out: 690 [Urine:690] Intake/Output from this shift: No intake/output data recorded.  Labs: Recent Labs    01/20/17 1423 01/21/17 0614 01/22/17 0435 01/22/17 0447  WBC 2.4* 5.6  --  8.7  HGB 8.6* 12.3  --  11.9*  HCT 26.0* 36.9  --  35.6  PLT 53* 70*  --  64*  CREATININE 0.64 0.88 0.83  --   MG 0.9* 3.0* 1.7  --   PHOS  --  2.0* 2.9  --   ALBUMIN 2.3* 3.3*  --   --   PROT 4.8* 7.3  --   --   AST 17 27  --   --   ALT 9* 10*  --   --   ALKPHOS 49 69  --   --   BILITOT 1.2 1.8*  --   --   BILIDIR 0.4  --   --   --   IBILI 0.8  --   --   --     Lab Results  Component Value Date   K 4.7 01/22/2017   Estimated Creatinine Clearance: 75.4 mL/min (by C-G formula based on SCr of 0.83 mg/dL).   Microbiology: No results found for this or any previous visit (from the past 720 hour(s)).  Medical History: Past Medical History:  Diagnosis Date  . COPD (chronic obstructive pulmonary disease) ([place])   . Depression   . GERD (gastroesophageal reflux disease)   . History of CT scan 04/1998   small lacunar infarcts  . History of MRI of brain and brain stem 07/1998   white matter changes  . Hx of transient ischemic attack (TIA) 2000   Hosp ?  Marland Kitchen Hypertension   . Hypothyroidism   . Infertility, female   . Psoriasis    on humira  . 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 dextrose 5 % and 0.45% NaCl 100 mL/hr at  01/22/17 0449  . magnesium sulfate 1 - 4 g bolus IVPB    . thiamine injection Stopped (01/21/17 1250)    Assessment: 55 yof cc FTT, PMH depression, GERD, HTN, hypothyroidism, vasculitis, EtOH SUD. HPI generalized weakness and confusion per husband. Recently stopped drinking with naltrexone and Librium. Severe electrolyte abnormalities noted in ED (K 2.4, Mg 0.9, Ca 6.4, albumin 2.3, adjusted calcium 7.7). Pharmacy consulted to manage electrolyte replacement.   Goal of Therapy:  K 3.5 to 5 (goal > 4) Magnesium 1.7 to 2.4 (goal >2) Calcium 8.9 to 10.3 Phosphorus 2.5 to 4.9   12/1: 1. Potassium chloride 10 mEq IV Q1H x 6 doses (per EDP); will give additonal KCl 10 mEq IV Q1H x 2 doses for a total of 80 mEq IV 2. Magnesium 4 gm IV x 1 (per hospitalist); will give additional magnesium 4 gm IV x 1 for a total of 8 gm IV 3. Calcium gluconate 2 gm IV x 1 (per hospitalist) 4. Recheck all electrolytes with AM labs  Plan:  K 4.5, Mag 3.0, Phos 2.0,  Ca 9.2- low phos, high mag Will d/c mag oxide for now K phos neutral tab 500 mg PO q4h x3 doses  Recheck K at 1800 to check WNL and recheck all electrolytes in AM  01/21/17 1818 K 4.7. No further supplement indicated at this time. Will remove KCl from IVF per Dr. Posey Pronto.   12/3  K 4.7  Mag 1.7  Phos 2.9.  Will order Magnesium sulfate 2 gram IV x 1. Will f/u labs in am.  Ryeleigh Santore A, Pharm.D., BCPS Clinical Pharmacist 01/22/2017,10:06 AM

## 2017-01-22 NOTE — Consult Note (Addendum)
Reason for Consult:AMS Referring Physician: Sudini  CC: AMS  HPI: Stephanie Raymond is an 61 y.o. female who is unable to provide any history today due to mental status.  Family not available at this time therefore all history obtained from the chart. Patient with a history of essential hypertension, hypothyroidism, vasculitis and heavy alcohol abuse.  Was brought into the hospital with generalized weakness confusion by her husband.  According to him patient stopped drinking about a week ago.  She was prescribed naltrexone and Librium for withdrawal symptoms.  He states that patient did okay for the first 2 days after stopping drinking then she started having weakness.  It should be noted though that the patinent has been having difficulties for the few months with multiple falls incurring cervical fractures for which she was cared for at [facility].  Has had issues with balance, shuffling gait, slurred speech, decreased po intake.  Now she is not able to walk at all.  She is also been confused.     Past Medical History:  Diagnosis Date  . COPD (chronic obstructive pulmonary disease) ([place])   . Depression   . GERD (gastroesophageal reflux disease)   . History of CT scan 04/1998   small lacunar infarcts  . History of MRI of brain and brain stem 07/1998   white matter changes  . Hx of transient ischemic attack (TIA) 2000   Hosp ?  [facility] Hypertension   . Hypothyroidism   . Infertility, female   . Psoriasis    on humira  . 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 BREAST BIOPSY Left Oct 14 2012   Negative  . carotid ultrasound  04/1998   neg  . CERVICAL CONE BIOPSY  02/1999   microinvasive cervical carcinoma  . CERVICAL CONIZATION W/BX  1980's  . FOOT SURGERY     toe treated, as a child  . PARTIAL HYSTERECTOMY  02/1999    Family History  Problem Relation Age of Onset  . Breast cancer Other 72       Paternal  . Emphysema  Mother        smoker  . AAA (abdominal aortic aneurysm) Mother   . Heart disease Father 39       CABG  . Heart disease Other        MI in 57s  . Heart disease Other        CHF  . Hypertension Other   . AAA (abdominal aortic aneurysm) Maternal Grandmother   . Colon cancer Neg Hx   . Colon polyps Neg Hx   . Stomach cancer Neg Hx   . Rectal cancer Neg Hx     Social History:  reports that she has been smoking cigarettes.  She has been smoking about 0.10 packs per day. she has never used smokeless tobacco. She reports that she drinks alcohol. She reports that she does not use drugs.  No Known Allergies  Medications:  I have reviewed the patient's current medications. Prior to Admission:  Medications Prior to Admission  Medication Sig Dispense Refill Last Dose  . buPROPion (WELLBUTRIN XL) 300 MG 24 hr tablet Take 1 tablet (300 mg total) by mouth daily. 90 tablet 3 01/19/2017 at 1300  . chlordiazePOXIDE (LIBRIUM) 25 MG capsule Take 1 capsule (25 mg total) by mouth 3 (three) times daily as needed for withdrawal. Caution of sedation, do not drive with this 60 capsule  0 01/20/2017 at 0500  . Cholecalciferol (VITAMIN D-3 PO) Take 1 capsule by mouth daily.    01/19/2017 at 1300  . CYANOCOBALAMIN PO Take 1 tablet by mouth daily.   01/19/2017 at 1300  . furosemide (LASIX) 80 MG tablet Take 1 tablet (80 mg total) by mouth daily. 90 tablet 3 01/19/2017 at 1300  . levothyroxine (SYNTHROID, LEVOTHROID) 75 MCG tablet Take 1 tablet (75 mcg total) by mouth daily. 90 tablet 3 01/19/2017 at 1300  . magnesium oxide (MAG-OX) 400 MG tablet Take 400 mg by mouth daily.    01/19/2017 at 1300  . methotrexate (RHEUMATREX) 2.5 MG tablet Take 10 mg by mouth once a week.   Past Month at Unknown time  . naltrexone (DEPADE) 50 MG tablet Take 1 tablet (50 mg total) by mouth daily. 30 tablet 5 01/19/2017 at 1300  . omeprazole (PRILOSEC) 20 MG capsule Take 1 capsule by mouth  every day 90 capsule 3 01/19/2017 at 1300  .  Secukinumab 150 MG/ML SOAJ Inject 150 mg into the skin every 30 (thirty) days. 150 mg/ml   12/17/2016 at 0800  . Spacer/Aero Chamber Mouthpiece MISC To use as directed with albuterol inhaler 1 each 0 Taking  . albuterol (PROVENTIL HFA;VENTOLIN HFA) 108 (90 Base) MCG/ACT inhaler Inhale 2 puffs into the lungs every 4 (four) hours as needed for wheezing. (Patient not taking: Reported on 01/20/2017) 3 Inhaler 3 Not Taking at Unknown time  . folic acid (FOLVITE) 1 MG tablet Take 1 mg by mouth daily.   Not Taking at Unknown time  . potassium chloride SA (K-DUR,KLOR-CON) 20 MEQ tablet Take 0.5 tablets (10 mEq total) by mouth 2 (two) times daily. Take with food (Patient not taking: Reported on 01/20/2017) 180 tablet 3 Not Taking at Unknown time   Scheduled: . cyanocobalamin  1,000 mcg Subcutaneous Daily  . folic acid  1 mg Oral Daily  . levothyroxine  75 mcg Oral QAC breakfast  . multivitamin with minerals  1 tablet Oral Daily  . nicotine  21 mg Transdermal Daily  . pantoprazole  40 mg Oral QAC breakfast    ROS: Unable to obtain due to mental status  Physical Examination: Blood pressure (!) 169/94, pulse (!) 102, temperature (!) 100.7 F (38.2 C), temperature source Axillary, resp. rate (!) 23, height 5\' 4"  (1.626 m), weight 85.8 kg (189 lb 4 oz), SpO2 99 %.  HEENT-  Normocephalic, left facial hematoma.  Normal external eye and conjunctiva.  Normal TM's bilaterally.  Normal auditory canals and external ears. Normal external nose, mucus membranes and septum.  Normal pharynx. Cardiovascular- S1, S2 normal, pulses palpable throughout   Lungs- chest clear, no wheezing, rales, normal symmetric air entry Abdomen- soft, non-tender; bowel sounds normal; no masses,  no organomegaly Extremities- no edema Lymph-no adenopathy palpable Musculoskeletal-scarring on the left foot with bony deformations noted Skin-multiple areas of bruising  Neurological Examination   Mental Status: Eyes closed.  Does not  respond to verbal stimuli.  Opens eyes, groans and localizes to deep sternal rub.    Cranial Nerves: II: patient does not respond confrontation bilaterally, pupils right 4 mm, left 4 mm,and reactive bilaterally III,IV,VI: doll's response present bilaterally, mild right ptosis  V,VII: corneal reflex present bilaterally  VIII: patient does not respond to verbal stimuli IX,X: gag reflex reduced, XI: trapezius strength unable to test bilaterally XII: tongue strength unable to test Motor: Moves all extremities to painful stimuli but not spontaneously or to command Sensory: Does not respond to noxious  stimuli in the upper extremities but responds to noxious stimuli in the lower extremities. Deep Tendon Reflexes:  2+ in the upper extremities and absent in the lower extremities Plantars: mute bilaterally Cerebellar: Unable to perform  Laboratory Studies:   Basic Metabolic Panel: Recent Labs  Lab 01/20/17 1423 01/21/17 0614 01/21/17 1818 01/22/17 0435  NA 140 134*  --  132*  K 2.4* 4.5 4.7 4.7  CL 113* 103  --  102  CO2 20* 21*  --  21*  GLUCOSE 68 98  --  91  BUN 9 8  --  9  CREATININE 0.64 0.88  --  0.83  CALCIUM 6.4* 9.2  --  8.6*  MG 0.9* 3.0*  --  1.7  PHOS  --  2.0*  --  2.9    Liver Function Tests: Recent Labs  Lab 01/20/17 1423 01/21/17 0614  AST 17 27  ALT 9* 10*  ALKPHOS 49 69  BILITOT 1.2 1.8*  PROT 4.8* 7.3  ALBUMIN 2.3* 3.3*   No results for input(s): LIPASE, AMYLASE in the last 168 hours. Recent Labs  Lab 01/20/17 1449  AMMONIA 13    CBC: Recent Labs  Lab 01/20/17 1423 01/21/17 0614 01/22/17 0447  WBC 2.4* 5.6 8.7  NEUTROABS 1.5  --  6.2  HGB 8.6* 12.3 11.9*  HCT 26.0* 36.9 35.6  MCV 98.8 98.3 100.1*  PLT 53* 70* 64*    Cardiac Enzymes: Recent Labs  Lab 01/20/17 1423  CKTOTAL 28*  TROPONINI <0.03    BNP: Invalid input(s): POCBNP  CBG: No results for input(s): GLUCAP in the last 168 hours.  Microbiology: Results for orders  placed or performed in visit on 10/11/11  Fecal occult blood, imunochemical     Status: None   Collection Time: 10/11/11  2:22 PM  Result Value Ref Range Status   Fecal Occult Bld Positive Negative Final    Comment: called to terriResults faxed to site/floor on 10/11/2011 3:41 PM by Delorise Jackson.  Fecal occult blood, imunochemical     Status: None   Collection Time: 10/11/11  2:22 PM  Result Value Ref Range Status   Fecal Occult Bld Positive Negative Final    Comment: called to terri    Coagulation Studies: Recent Labs    01/20/17 1423  LABPROT 20.4*  INR 1.76    Urinalysis:  Recent Labs  Lab 01/20/17 1652  COLORURINE AMBER*  LABSPEC 1.017  PHURINE 6.0  GLUCOSEU NEGATIVE  HGBUR NEGATIVE  BILIRUBINUR NEGATIVE  KETONESUR NEGATIVE  PROTEINUR NEGATIVE  NITRITE NEGATIVE  LEUKOCYTESUR NEGATIVE    Lipid Panel:     Component Value Date/Time   CHOL 138 09/20/2016 1212   CHOL 80 12/25/2013 0408   TRIG 84.0 09/20/2016 1212   TRIG 55 12/25/2013 0408   HDL 62.70 09/20/2016 1212   HDL 38 (L) 12/25/2013 0408   CHOLHDL 2 09/20/2016 1212   VLDL 16.8 09/20/2016 1212   VLDL 11 12/25/2013 0408   LDLCALC 58 09/20/2016 1212   LDLCALC 31 12/25/2013 0408    HgbA1C:  Lab Results  Component Value Date   HGBA1C 4.7 05/26/2011    Urine Drug Screen:      Component Value Date/Time   LABOPIA NEGATIVE 01/16/2014 1613   COCAINSCRNUR NEGATIVE 01/16/2014 1613   LABBENZ POSITIVE 01/16/2014 1613   AMPHETMU NEGATIVE 01/16/2014 1613   THCU POSITIVE 01/16/2014 1613   LABBARB NEGATIVE 01/16/2014 1613    Alcohol Level:  Recent Labs  Lab 01/20/17 1423  ETH <10  Other results: EKG: sinus rhythm at 94 bpm.  Imaging: Ct Head Wo Contrast  Result Date: 01/20/2017 CLINICAL DATA:  Fall and bruising.  Initial encounter. EXAM: CT HEAD WITHOUT CONTRAST CT MAXILLOFACIAL WITHOUT CONTRAST CT CERVICAL SPINE WITHOUT CONTRAST TECHNIQUE: Multidetector CT imaging of the head, cervical spine,  and maxillofacial structures were performed using the standard protocol without intravenous contrast. Multiplanar CT image reconstructions of the cervical spine and maxillofacial structures were also generated. COMPARISON:  01/03/2017 FINDINGS: CT HEAD FINDINGS Brain: No evidence of acute infarction, hemorrhage, hydrocephalus, extra-axial collection or mass lesion/mass effect. Advanced chronic small vessel ischemia with white matter gliosis that is confluent. There have been remote lacunar infarcts in the left pons, left thalamus, and bilateral basal ganglia. 5 mm colloid cyst, best seen on coronal reformats. Vascular: Atherosclerotic calcification.  No hyperdense vessel. Skull: No acute finding CT MAXILLOFACIAL FINDINGS Osseous: Negative for fracture or mandibular dislocation. Orbits: No evidence of injury. Sinuses: No evidence of injury. No hemosinus. Anterior nasal septal perforation. Soft tissues: Contusion and hematoma along the left cheek. CT CERVICAL SPINE FINDINGS Alignment: Chronic anterolisthesis at C3-4 and C4-5 measuring up to 5 mm. Milder anterolisthesis at C7-T1. Cervical kyphosis. No acute malalignment. Skull base and vertebrae: Remote C1 bilateral posterior arch fractures. Remote left anterior arch and lateral mass fracture of C1. Remote left C4 and C5 facet fractures without displacement. No acute fracture. Soft tissues and spinal canal: No prevertebral fluid or swelling. No visible canal hematoma. Disc levels: Diffuse degenerative disc narrowing. Cervical facet arthropathy from C2-3 to C5-6. Upper chest: No acute finding IMPRESSION: Head CT: 1. No evidence of intracranial injury. 2. Extensive chronic small vessel ischemic injury. 3. 5 mm colloid cyst.  No hydrocephalus. Cervical spine CT: 1. Known remote C1 ring and left lateral mass fractures. Known remote left C4 and C5 facet fractures. No acute fracture or acute malalignment. 2. Advanced disc and facet degeneration with chronic C3-4 and C4-5  anterolisthesis. Maxillofacial CT: Contusion over the left cheek. No fracture or mandibular dislocation. Electronically Signed   By: Monte Fantasia M.D.   On: 01/20/2017 17:10   Ct Cervical Spine Wo Contrast  Result Date: 01/20/2017 CLINICAL DATA:  Fall and bruising.  Initial encounter. EXAM: CT HEAD WITHOUT CONTRAST CT MAXILLOFACIAL WITHOUT CONTRAST CT CERVICAL SPINE WITHOUT CONTRAST TECHNIQUE: Multidetector CT imaging of the head, cervical spine, and maxillofacial structures were performed using the standard protocol without intravenous contrast. Multiplanar CT image reconstructions of the cervical spine and maxillofacial structures were also generated. COMPARISON:  01/03/2017 FINDINGS: CT HEAD FINDINGS Brain: No evidence of acute infarction, hemorrhage, hydrocephalus, extra-axial collection or mass lesion/mass effect. Advanced chronic small vessel ischemia with white matter gliosis that is confluent. There have been remote lacunar infarcts in the left pons, left thalamus, and bilateral basal ganglia. 5 mm colloid cyst, best seen on coronal reformats. Vascular: Atherosclerotic calcification.  No hyperdense vessel. Skull: No acute finding CT MAXILLOFACIAL FINDINGS Osseous: Negative for fracture or mandibular dislocation. Orbits: No evidence of injury. Sinuses: No evidence of injury. No hemosinus. Anterior nasal septal perforation. Soft tissues: Contusion and hematoma along the left cheek. CT CERVICAL SPINE FINDINGS Alignment: Chronic anterolisthesis at C3-4 and C4-5 measuring up to 5 mm. Milder anterolisthesis at C7-T1. Cervical kyphosis. No acute malalignment. Skull base and vertebrae: Remote C1 bilateral posterior arch fractures. Remote left anterior arch and lateral mass fracture of C1. Remote left C4 and C5 facet fractures without displacement. No acute fracture. Soft tissues and spinal canal: No prevertebral fluid or swelling.  No visible canal hematoma. Disc levels: Diffuse degenerative disc narrowing.  Cervical facet arthropathy from C2-3 to C5-6. Upper chest: No acute finding IMPRESSION: Head CT: 1. No evidence of intracranial injury. 2. Extensive chronic small vessel ischemic injury. 3. 5 mm colloid cyst.  No hydrocephalus. Cervical spine CT: 1. Known remote C1 ring and left lateral mass fractures. Known remote left C4 and C5 facet fractures. No acute fracture or acute malalignment. 2. Advanced disc and facet degeneration with chronic C3-4 and C4-5 anterolisthesis. Maxillofacial CT: Contusion over the left cheek. No fracture or mandibular dislocation. Electronically Signed   By: Monte Fantasia M.D.   On: 01/20/2017 17:10   Ct Abdomen Pelvis W Contrast  Result Date: 01/20/2017 CLINICAL DATA:  61 year old female with acute abdominal and pelvic pain and distention. EXAM: CT ABDOMEN AND PELVIS WITH CONTRAST TECHNIQUE: Multidetector CT imaging of the abdomen and pelvis was performed using the standard protocol following bolus administration of intravenous contrast. CONTRAST:  179mL ISOVUE-300 IOPAMIDOL (ISOVUE-300) INJECTION 61% COMPARISON:  05/25/2011 CTA FINDINGS: Lower chest: No acute abnormality. Hepatobiliary: Cirrhosis identified. No hepatic lesions are present cholelithiasis identified without definite CT evidence of acute cholecystitis. No biliary dilatation. Pancreas: Unremarkable Spleen: Moderate splenomegaly has increased since 2013, now with splenic volume of 150 cc. Adrenals/Urinary Tract: The kidneys, adrenal glands and bladder are unremarkable. Stomach/Bowel: Stomach is within normal limits. Appendix appears normal. No evidence of bowel wall thickening, distention, or inflammatory changes. Vascular/Lymphatic: Aortic atherosclerosis. No enlarged abdominal or pelvic lymph nodes. Reproductive: Status post hysterectomy. No adnexal masses. Other: No ascites, focal collection or pneumoperitoneum. Musculoskeletal: No acute bony abnormality or suspicious lesion. IMPRESSION: 1. No evidence of acute  abnormality. 2. Cholelithiasis without CT evidence of acute cholecystitis. Consider ultrasound or nuclear medicine study if there is strong clinical suspicion for acute cholecystitis. 3. Cirrhosis. 4. Moderate splenomegaly, increased since 2013. 5.  Aortic Atherosclerosis (ICD10-I70.0). Electronically Signed   By: Margarette Canada M.D.   On: 01/20/2017 17:06   Dg Chest Port 1 View  Result Date: 01/22/2017 CLINICAL DATA:  Possible aspiration.  Hypoxia. EXAM: PORTABLE CHEST 1 VIEW COMPARISON:  Chest radiograph 12/24/2013. FINDINGS: Normal cardiomediastinal silhouette. Tortuous thoracic aorta. Chronic elevation RIGHT hemidiaphragm. Small RIGHT effusion. There may be mild basilar atelectasis on the RIGHT, but no definite consolidation. I see no pneumothorax. Bones are unremarkable. IMPRESSION: Chronic elevation RIGHT hemidiaphragm. There may be a small effusion and mild basilar atelectasis on the RIGHT. No definite consolidation. Electronically Signed   By: Staci Righter M.D.   On: 01/22/2017 09:47   Ct Maxillofacial Wo Contrast  Result Date: 01/20/2017 CLINICAL DATA:  Fall and bruising.  Initial encounter. EXAM: CT HEAD WITHOUT CONTRAST CT MAXILLOFACIAL WITHOUT CONTRAST CT CERVICAL SPINE WITHOUT CONTRAST TECHNIQUE: Multidetector CT imaging of the head, cervical spine, and maxillofacial structures were performed using the standard protocol without intravenous contrast. Multiplanar CT image reconstructions of the cervical spine and maxillofacial structures were also generated. COMPARISON:  01/03/2017 FINDINGS: CT HEAD FINDINGS Brain: No evidence of acute infarction, hemorrhage, hydrocephalus, extra-axial collection or mass lesion/mass effect. Advanced chronic small vessel ischemia with white matter gliosis that is confluent. There have been remote lacunar infarcts in the left pons, left thalamus, and bilateral basal ganglia. 5 mm colloid cyst, best seen on coronal reformats. Vascular: Atherosclerotic calcification.   No hyperdense vessel. Skull: No acute finding CT MAXILLOFACIAL FINDINGS Osseous: Negative for fracture or mandibular dislocation. Orbits: No evidence of injury. Sinuses: No evidence of injury. No hemosinus. Anterior nasal septal perforation. Soft tissues:  Contusion and hematoma along the left cheek. CT CERVICAL SPINE FINDINGS Alignment: Chronic anterolisthesis at C3-4 and C4-5 measuring up to 5 mm. Milder anterolisthesis at C7-T1. Cervical kyphosis. No acute malalignment. Skull base and vertebrae: Remote C1 bilateral posterior arch fractures. Remote left anterior arch and lateral mass fracture of C1. Remote left C4 and C5 facet fractures without displacement. No acute fracture. Soft tissues and spinal canal: No prevertebral fluid or swelling. No visible canal hematoma. Disc levels: Diffuse degenerative disc narrowing. Cervical facet arthropathy from C2-3 to C5-6. Upper chest: No acute finding IMPRESSION: Head CT: 1. No evidence of intracranial injury. 2. Extensive chronic small vessel ischemic injury. 3. 5 mm colloid cyst.  No hydrocephalus. Cervical spine CT: 1. Known remote C1 ring and left lateral mass fractures. Known remote left C4 and C5 facet fractures. No acute fracture or acute malalignment. 2. Advanced disc and facet degeneration with chronic C3-4 and C4-5 anterolisthesis. Maxillofacial CT: Contusion over the left cheek. No fracture or mandibular dislocation. Electronically Signed   By: Monte Fantasia M.D.   On: 01/20/2017 17:10     Assessment/Plan: 61 year old female with a history of ETOH abuse now presenting with altered mental status.  Per history it appears that the patient has been slowly declining over the past 3-4 months.  Now poorly responsive and not ambulating prior to admission.  Patient was in the process of detoxing prior to admission.  No focal findings on limited neurological examination.  Head CT reviewed and shows extensive small vessel disease but no acute changes.  Suspect mental  status is multifactorial.  Patient with multiple metabolic abnormalities on admission that superimposed on extensive small vessel disease could very well be causing mental status changes that would lag behind the correction of lab work.  Patient also likely with some component of ETOH withdrawal as well.  What is of additional concern is the elevated wbc count and fever.  No source identified.  CNS infection in the differential but patient with low plt count and elevated coags therefore LP at increased risk at this time.    Recommendations: 1.  Broad spectrum antibiotic coverage for CNS including Acyclovir 2.  EEG 3.  Patient may benefit from MRI but patient not cooperative enough to complete at this time.  Once more cooperative if has not returned to baseline would consider.   4. B1 pending.  Agree with thiamine and folate supplementation  5.  Agree with addressing metabolic abnormalities  Case discussed with Dr. Sharia Reeve, MD Neurology [phone] 01/22/2017, 11:47 AM

## 2017-01-22 NOTE — Progress Notes (Signed)
Pharmacy Antibiotic Note  Stephanie Raymond is a 61 y.o. female admitted on 01/20/2017 with empriric antibiotic coverage for meningitis.  Pharmacy has been consulted for Vancomycin and Acyclovir dosing.  Patient also on Ceftriaxone 2 gram IV q12h.  Plan: -Will order Acyclovir 670 mg IV q8h (dose of 10 mg/kg/dose based on Adjusted body weight of 67 kg). -Will order Vancomycin 1250 mg IV x 1 and continue with stacked dosing of: Vancomycin 1250 IV every 18 hours.  Goal trough 15-20 mcg/mL.  Ke 0.055  T1/2 12.6  Vd 47    Adj BW 67 kg Will order trough prior to 5th dose on 12/6 at 1430.     Height: 5\' 4"  (162.6 cm) Weight: 189 lb 4 oz (85.8 kg) IBW/kg (Calculated) : 54.7  Temp (24hrs), Avg:100.6 F (38.1 C), Min:99.2 F (37.3 C), Max:101.2 F (38.4 C)  Recent Labs  Lab 01/20/17 1423 01/21/17 0614 01/22/17 0435 01/22/17 0447  WBC 2.4* 5.6  --  8.7  CREATININE 0.64 0.88 0.83  --     Estimated Creatinine Clearance: 75.4 mL/min (by C-G formula based on SCr of 0.83 mg/dL).    No Known Allergies  Antimicrobials this admission: acyclovir 12/3 >>   Vanc 12/3 >>   CTX 12/3 >>  Dose adjustments this admission:    Microbiology results: 12/3 BCx: pend   UCx:      Sputum:      MRSA PCR:    Thank you for allowing pharmacy to be a part of this patient's care.  Kymber Kosar A 01/22/2017 1:57 PM

## 2017-01-22 NOTE — Progress Notes (Signed)
Pt continues to be lethargic and not interactive.  She will raise her eyebrows occasionally when you call her name, but she will not open her eyes.  She remains tachycardic.

## 2017-01-22 NOTE — Care Management Note (Signed)
Case Management Note  Patient Details  Name: Stephanie Raymond MRN: [id number] Date of Birth: [birth year]  Subjective/Objective:     Admitted to [facility] with the diagnosis of confusion. Lives with husband, Gene [phone]). Seen Dr. Glori Bickers about 4 months ago. Sees no psychiatrist. Prescriptions are filled at [facility] in [place]. No home health. No skilled facility.  No home oxygen. Uses a rolling walker to aide in ambulation. Self feed. Husband has been helping with dressing and baths for the last week.   Last fall was prior to this admission. Decreased appetite, but has gained weight.           Husband states that "she has broken her neck x 5." Last break was 9 weeks ago and was transferred to [facility].  Husband states that she has been drinking all her life, but really bad for a year. "I quit, but she continues to drink." Sees no outside resources for drinking problems.   Action/Plan: Will continue to follow for discharge needs. Will update Clinical Social Worker   Expected Discharge Date:                  Expected Discharge Plan:     In-House Referral:     Discharge planning Services     Post Acute Care Choice:    Choice offered to:     DME Arranged:    DME Agency:     HH Arranged:    HH Agency:     Status of Service:     If discussed at [employer], dates discussed:    Additional Comments:  Shelbie Ammons, RN MSN CCM Care Management [phone] 01/22/2017, 8:27 AM

## 2017-01-23 ENCOUNTER — Ambulatory Visit: Payer: Medicare HMO | Admitting: Family Medicine

## 2017-01-23 ENCOUNTER — Inpatient Hospital Stay: Payer: Medicare HMO

## 2017-01-23 DIAGNOSIS — F1011 Alcohol abuse, in remission: Secondary | ICD-10-CM

## 2017-01-23 DIAGNOSIS — Z66 Do not resuscitate: Secondary | ICD-10-CM

## 2017-01-23 DIAGNOSIS — Z87898 Personal history of other specified conditions: Secondary | ICD-10-CM

## 2017-01-23 DIAGNOSIS — Z515 Encounter for palliative care: Secondary | ICD-10-CM

## 2017-01-23 LAB — CBC WITH DIFFERENTIAL/PLATELET
BASOS ABS: 0 10*3/uL (ref 0–0.1)
BASOS PCT: 0 %
EOS ABS: 0.1 10*3/uL (ref 0–0.7)
EOS PCT: 1 %
HCT: 34.4 % — ABNORMAL LOW (ref 35.0–47.0)
Hemoglobin: 11.5 g/dL — ABNORMAL LOW (ref 12.0–16.0)
Lymphocytes Relative: 8 %
Lymphs Abs: 0.5 10*3/uL — ABNORMAL LOW (ref 1.0–3.6)
MCH: 32.3 pg (ref 26.0–34.0)
MCHC: 33.4 g/dL (ref 32.0–36.0)
MCV: 96.7 fL (ref 80.0–100.0)
MONO ABS: 0.4 10*3/uL (ref 0.2–0.9)
Monocytes Relative: 7 %
Neutro Abs: 5.3 10*3/uL (ref 1.4–6.5)
Neutrophils Relative %: 84 %
PLATELETS: 69 10*3/uL — AB (ref 150–440)
RBC: 3.56 MIL/uL — AB (ref 3.80–5.20)
RDW: 14.5 % (ref 11.5–14.5)
WBC: 6.4 10*3/uL (ref 3.6–11.0)

## 2017-01-23 LAB — BASIC METABOLIC PANEL
ANION GAP: 10 (ref 5–15)
BUN: 10 mg/dL (ref 6–20)
CALCIUM: 8.6 mg/dL — AB (ref 8.9–10.3)
CO2: 19 mmol/L — ABNORMAL LOW (ref 22–32)
CREATININE: 0.88 mg/dL (ref 0.44–1.00)
Chloride: 100 mmol/L — ABNORMAL LOW (ref 101–111)
GFR calc non Af Amer: >60 mL/min (ref >60)
Glucose, Bld: 104 mg/dL — ABNORMAL HIGH (ref 65–99)
Potassium: 4.4 mmol/L (ref 3.5–5.1)
SODIUM: 129 mmol/L — AB (ref 135–145)

## 2017-01-23 LAB — PHOSPHORUS: PHOSPHORUS: 2.8 mg/dL (ref 2.5–4.6)

## 2017-01-23 LAB — MAGNESIUM: MAGNESIUM: 1.5 mg/dL — AB (ref 1.7–2.4)

## 2017-01-23 MED ORDER — FOLIC ACID 5 MG/ML IJ SOLN
1.0000 mg | Freq: Every day | INTRAMUSCULAR | Status: DC
  Administered 2017-01-23 – 2017-01-24 (×2): 1 mg via INTRAVENOUS
  Filled 2017-01-23 (×6): qty 0.2

## 2017-01-23 MED ORDER — SODIUM CHLORIDE 0.9 % IV SOLN
INTRAVENOUS | Status: DC
  Administered 2017-01-23 – 2017-01-24 (×2): via INTRAVENOUS

## 2017-01-23 MED ORDER — THIAMINE HCL 100 MG/ML IJ SOLN
500.0000 mg | INTRAVENOUS | Status: DC
  Administered 2017-01-24: 11:00:00 500 mg via INTRAVENOUS
  Filled 2017-01-23 (×2): qty 5

## 2017-01-23 MED ORDER — MAGNESIUM SULFATE 4 GM/100ML IV SOLN
4.0000 g | Freq: Once | INTRAVENOUS | Status: AC
  Administered 2017-01-23: 10:00:00 4 g via INTRAVENOUS
  Filled 2017-01-23: qty 100

## 2017-01-23 MED ORDER — ACETAMINOPHEN 325 MG PO TABS: 650.0000 mg | ORAL_TABLET | Freq: Four times a day (QID) | ORAL | Status: DC | PRN

## 2017-01-23 MED ORDER — ACETAMINOPHEN 650 MG RE SUPP
650.0000 mg | RECTAL | Status: DC | PRN
  Administered 2017-01-23: 10:00:00 650 mg via RECTAL
  Filled 2017-01-23: qty 1

## 2017-01-23 NOTE — Progress Notes (Signed)
Pt bathed.  Placed pink foam to sacrum.  Pt has blanchable redness to buttocks and to right heel.  Placed Pravalon boot on right foot and elevated both feet with a pillow.  Pt is being turned every 2 hours.  Pt has +1 pitting generalized edema. Dorna Bloom RN

## 2017-01-23 NOTE — Progress Notes (Signed)
MEDICATION RELATED CONSULT NOTE - Follow up  Pharmacy Consult for electrolyte replacement Indication: hypokalemia, hypomagnesemia  No Known Allergies  Patient Measurements: Height: 5\' 4"  (162.6 cm) Weight: 189 lb 4 oz (85.8 kg) IBW/kg (Calculated) : 54.7 Adjusted Body Weight:   Vital Signs: Temp: 100.7 F (38.2 C) (12/04 0658) Temp Source: Axillary (12/04 0658) BP: 161/92 (12/04 0404) Pulse Rate: 117 (12/04 0404) Intake/Output from previous day: 12/03 0701 - 12/04 0700 In: 2971.5 [I.V.:2231.3; IV Piggyback:740.2] Out: 0630 [Urine:1675] Intake/Output from this shift: No intake/output data recorded.  Labs: Recent Labs    01/20/17 1423 01/21/17 0614 01/22/17 0435 01/22/17 0447 01/23/17 0422  WBC 2.4* 5.6  --  8.7 6.4  HGB 8.6* 12.3  --  11.9* 11.5*  HCT 26.0* 36.9  --  35.6 34.4*  PLT 53* 70*  --  64* 69*  CREATININE 0.64 0.88 0.83  --  0.88  MG 0.9* 3.0* 1.7  --  1.5*  PHOS  --  2.0* 2.9  --  2.8  ALBUMIN 2.3* 3.3*  --   --   --   PROT 4.8* 7.3  --   --   --   AST 17 27  --   --   --   ALT 9* 10*  --   --   --   ALKPHOS 49 69  --   --   --   BILITOT 1.2 1.8*  --   --   --   BILIDIR 0.4  --   --   --   --   IBILI 0.8  --   --   --   --     Lab Results  Component Value Date   K 4.4 01/23/2017   Estimated Creatinine Clearance: 71.1 mL/min (by C-G formula based on SCr of 0.88 mg/dL).   Microbiology: No results found for this or any previous visit (from the past 720 hour(s)).  Medical History: Past Medical History:  Diagnosis Date  . COPD (chronic obstructive pulmonary disease) ([place])   . Depression   . GERD (gastroesophageal reflux disease)   . History of CT scan 04/1998   small lacunar infarcts  . History of MRI of brain and brain stem 07/1998   white matter changes  . Hx of transient ischemic attack (TIA) 2000   Hosp ?  Marland Kitchen Hypertension   . Hypothyroidism   . Infertility, female   . Psoriasis    on humira  . 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 sodium chloride    . acyclovir Stopped (01/23/17 1601)  . cefTRIAXone (ROCEPHIN)  IV Stopped (01/22/17 2213)  . thiamine injection Stopped (01/22/17 1126)  . vancomycin      Assessment: 1 yof cc FTT, PMH depression, GERD, HTN, hypothyroidism, vasculitis, EtOH SUD. HPI generalized weakness and confusion per husband. Recently stopped drinking with naltrexone and Librium. Severe electrolyte abnormalities noted in ED (K 2.4, Mg 0.9, Ca 6.4, albumin 2.3, adjusted calcium 7.7). Pharmacy consulted to manage electrolyte replacement.   Goal of Therapy:  K 3.5 to 5 (goal > 4) Magnesium 1.7 to 2.4 (goal >2) Calcium 8.9 to 10.3 Phosphorus 2.5 to 4.9   12/1: 1. Potassium chloride 10 mEq IV Q1H x 6 doses (per EDP); will give additonal KCl 10 mEq IV Q1H x 2 doses for a total of 80 mEq IV 2. Magnesium 4 gm IV x 1 (per hospitalist); will give additional magnesium 4 gm IV x  1 for a total of 8 gm IV 3. Calcium gluconate 2 gm IV x 1 (per hospitalist) 4. Recheck all electrolytes with AM labs  Plan:  K 4.5, Mag 3.0, Phos 2.0, Ca 9.2- low phos, high mag Will d/c mag oxide for now K phos neutral tab 500 mg PO q4h x3 doses  Recheck K at 1800 to check WNL and recheck all electrolytes in AM  01/21/17 1818 K 4.7. No further supplement indicated at this time. Will remove KCl from IVF per Dr. Posey Pronto.   12/3  K 4.7  Mag 1.7  Phos 2.9.  Will order Magnesium sulfate 2 gram IV x 1. Will f/u labs in am.  12/4  K 4.4  Mag 1.5  Phos 2.8  Will order Magnesium 4 gram IV x 1. Will f/u labs in am  Marcquis Ridlon A, Pharm.D., BCPS Clinical Pharmacist 01/23/2017,7:53 AM

## 2017-01-23 NOTE — Progress Notes (Signed)
Initial Nutrition Assessment  DOCUMENTATION CODES:   Obesity unspecified  INTERVENTION:   RD will monitor for diet advancement vs the need for nutrition support  Recommend obtain new weight  Recommend folic acid 1mg  daily IV until pt able to take po meds  Recommend MVI daily IV until pt able to take po meds  Vitamin B12- 1043mcg IV daily per MD  Thiamine 500mg  IV daily per MD  NUTRITION DIAGNOSIS:   Increased nutrient needs related to (etoh abuse and cirrhosis) as evidenced by increased estimated needs from protein.  GOAL:   Patient will meet greater than or equal to 90% of their needs  MONITOR:   Diet advancement, Labs, Weight trends, I & O's, Skin  REASON FOR ASSESSMENT:   Low Braden    ASSESSMENT:   61 y.o. female with a known history of depression, GERD, essential hypertension, hypothyroidism and history of vasculitis as well as heavy alcohol abuse.  Who is brought into the hospital with generalized weakness confusion by her husband.    Visited pt's room today. Unable to obtain nutrition related history r/t pt with AMS. No family present at bedside. Pt with h/o etoh abuse. Per chart, pt appears to have gained 17lbs since August; unsure if admit weight is correct. No new weight since 12/1; recommend obtain new weight. Pt at high risk for Freddy Jaksch disease given her h/o etoh abuse. Dry Beri Beri symptoms can include muscle weakness and mental confusion. Pt has thiamine labs pending and is already being supplemented. Pt noted to have brittle, thinning hair on exam today. RD suspects this patient is likely malnourished secondary to etoh abuse but pt does not meet criteria at this time. Recommend IV administration of vitamins until pt able to take oral meds. Pt is likely at high refeeding risk; monitor electrolytes closely once nutrition is initiated. RD will monitor for the need for nutrition support.    Medications reviewed and include: H70, folic acid, synthroid, MVI,  nicotine, protonix, NaCl @50ml /hr, ceftriaxone, thiamine, vancomycin  Labs reviewed: Na 129(L), Cl 100(L), Ca 8.6(L), P 2.8 wnl, Mg 1.5(L) B-12- 252- 12/2  Nutrition-Focused physical exam completed. Findings are no fat depletion, no muscle depletion, and mild generalized edema. Pt noted to have brittle, thinning hair.    Diet Order:  Diet NPO time specified  EDUCATION NEEDS:   Not appropriate for education at this time  Skin:  Reviewed RN Assessment  Last BM:  12/4- TYPE 4  Height:   Ht Readings from Last 1 Encounters:  01/20/17 5\' 4"  (1.626 m)    Weight:   Wt Readings from Last 1 Encounters:  01/20/17 189 lb 4 oz (85.8 kg)    Ideal Body Weight:  54.5 kg  BMI:  Body mass index is 32.48 kg/m.  Estimated Nutritional Needs:   Kcal:  1500-1800kcal/day   Protein:  86-103g/day   Fluid:  >1.5L/day   Koleen Distance MS, RD, LDN Pager #[phone] After Hours Pager: [phone]

## 2017-01-23 NOTE — Consult Note (Signed)
Consultation Note Date: 01/23/2017   Patient Name: Stephanie Raymond  DOB: May 26, 1955  MRN: [id number]  Age / Sex: 61 y.o., female  PCP: Tower, Wynelle Fanny, MD Referring Physician: Hillary Bow, MD  Reason for Consultation: Establishing goals of care and Psychosocial/spiritual support  HPI/Patient Profile: 61 y.o. female  admitted on 01/20/2017 with a past medical history of depression, GERD, essential hypertension, hypothyroidism and history of vasculitis as well as heavy alcohol abuse.   Brought into the hospital with generalized weakness confusion by her husband.  According to him patient stopped drinking about a week ago.  Which she was prescribed naltrexone and Librium for withdrawal symptoms.  He states that patient did okay for the first 2 days after stopped drinking then she started having weakness.  She started having difficulty with walking.   Admitted for stabilization and treatment.  Patient remains obtunded, febrile/on IV antibiotics, neurology on board/EEG only significant for slowing.  Family face treatment option decisions, advanced directive decisions, and anticipatory care needs.   Clinical Assessment and Goals of Care:  This NP Wadie Lessen reviewed medical records, received report from team, assessed the patient and then meet at the patient's bedside along with her husband and sister in law   to discuss diagnosis, prognosis, GOC, EOL wishes disposition and options.  A detailed discussion was had today regarding advanced directives.  Concepts specific to code status, artifical feeding and hydration, continued IV antibiotics and rehospitalization was had.  The difference between a aggressive medical intervention path  and a palliative comfort care path for this patient at this time was had.  Values and goals of care important to patient and family were attempted to be elicited.  MOST form  introduced.  Concept of Hospice and Palliative Care were discussed  Natural trajectory and expectations at EOL were discussed.  Questions and concerns addressed.   Family encouraged to call with questions or concerns.  PMT will continue to support holistically.     SUMMARY OF RECOMMENDATIONS    Code Status/Advance Care Planning:  DNR   Palliative Prophylaxis:   Aspiration, Bowel Regimen, Delirium Protocol, Frequent Pain Assessment and Oral Care  Additional Recommendations (Limitations, Scope, Preferences):  Full Scope Treatment     Husband is hopeful for improvement.  He wishes to continue with current medical interventions over the next 24-48 hours to see if there is any improvement.  However comfort, quality, and dignity are the priority of care.  Husband verbalizes that if the patient does not rebound and appears to have little chance of recovery is focus will be comfort at that time.  Palliative medicine will to continue to support holistically      Psycho-social/Spiritual:   Desire for further Chaplaincy support:yes  Additional Recommendations: Education on Hospice  Prognosis:   Unable to determine  Discharge Planning: To Be Determined      Primary Diagnoses: Present on Admission: . Confusion   I have reviewed the medical record, interviewed the patient and family, and examined the patient. The following aspects are  pertinent.  Past Medical History:  Diagnosis Date  . COPD (chronic obstructive pulmonary disease) ([place])   . Depression   . GERD (gastroesophageal reflux disease)   . History of CT scan 04/1998   small lacunar infarcts  . History of MRI of brain and brain stem 07/1998   white matter changes  . Hx of transient ischemic attack (TIA) 2000   Hosp ?  Marland Kitchen Hypertension   . Hypothyroidism   . Infertility, female   . Psoriasis    on humira  . 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  Marital status: Married    Spouse name: None  . Number of children: 0  . Years of education: None  . Highest education level: None  Social Needs  . Financial resource strain: None  . Food insecurity - worry: None  . Food insecurity - inability: None  . Transportation needs - medical: None  . Transportation needs - non-medical: None  Occupational History  . 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 Smoking status: Current Every Day Smoker    Packs/day: 0.10    Types: Cigarettes  . Smokeless tobacco: Never Used  . Tobacco comment: 1-2 cigaretts a day  Substance and Sexual Activity  . Alcohol use: Yes    Comment: Per husband, pt stopped drinking 1 week ago  . Drug use: No  . Sexual activity: None  Other Topics Concern  . None  Social History Narrative  . None   Family History  Problem Relation Age of Onset  . Breast cancer Other 34       Paternal  . Emphysema Mother        smoker  . AAA (abdominal aortic aneurysm) Mother   . Heart disease Father 9       CABG  . Heart disease Other        MI in 80s  . Heart disease Other        CHF  . Hypertension Other   . AAA (abdominal aortic aneurysm) Maternal Grandmother   . Colon cancer Neg Hx   . Colon polyps Neg Hx   . Stomach cancer Neg Hx   . Rectal cancer Neg Hx    Scheduled Meds: . cyanocobalamin  1,000 mcg Subcutaneous Daily  . folic acid  1 mg Oral Daily  . levothyroxine  75 mcg Oral QAC breakfast  . multivitamin with minerals  1 tablet Oral Daily  . nicotine  21 mg Transdermal Daily  . pantoprazole  40 mg Oral QAC breakfast   Continuous Infusions: . sodium chloride 50 mL/hr at 01/23/17 0823  . acyclovir Stopped (01/23/17 9563)  . cefTRIAXone (ROCEPHIN)  IV Stopped (01/22/17 2213)  . magnesium sulfate 1 - 4 g bolus IVPB    . thiamine injection 0 mg (01/22/17 1126)  . vancomycin 1,250 mg (01/23/17 0813)   PRN Meds:.acetaminophen **OR** acetaminophen, albuterol, LORazepam **OR**  LORazepam, ondansetron **OR** ondansetron (ZOFRAN) IV Medications Prior to Admission:  Prior to Admission medications   Medication Sig Start Date End Date Taking? Authorizing Provider  buPROPion (WELLBUTRIN XL) 300 MG 24 hr tablet Take 1 tablet (300 mg total) by mouth daily. 09/22/16  Yes Tower, Wynelle Fanny, MD  chlordiazePOXIDE (LIBRIUM) 25 MG capsule Take 1 capsule (25 mg total) by mouth 3 (three) times daily as needed for withdrawal. Caution of sedation, do not drive with this 09/27/54  Yes Tower, Wynelle Fanny, MD  Cholecalciferol (VITAMIN D-3 PO) Take 1 capsule by mouth daily.    Yes [provider]  CYANOCOBALAMIN PO Take 1 tablet by mouth daily.   Yes [provider]  furosemide (LASIX) 80 MG tablet Take 1 tablet (80 mg total) by mouth daily. 09/22/16  Yes Tower, Wynelle Fanny, MD  levothyroxine (SYNTHROID, LEVOTHROID) 75 MCG tablet Take 1 tablet (75 mcg total) by mouth daily. 09/22/16  Yes Tower, Wynelle Fanny, MD  magnesium oxide (MAG-OX) 400 MG tablet Take 400 mg by mouth daily.    Yes [provider]  methotrexate (RHEUMATREX) 2.5 MG tablet Take 10 mg by mouth once a week.   Yes [provider]  naltrexone (DEPADE) 50 MG tablet Take 1 tablet (50 mg total) by mouth daily. 09/22/16  Yes Tower, Wynelle Fanny, MD  omeprazole (PRILOSEC) 20 MG capsule Take 1 capsule by mouth  every day 09/22/16  Yes Tower, Wynelle Fanny, MD  Secukinumab 150 MG/ML SOAJ Inject 150 mg into the skin every 30 (thirty) days. 150 mg/ml   Yes [provider]  Spacer/Aero Chamber Mouthpiece MISC To use as directed with albuterol inhaler 10/12/15  Yes Tower, Wynelle Fanny, MD  albuterol (PROVENTIL HFA;VENTOLIN HFA) 108 (90 Base) MCG/ACT inhaler Inhale 2 puffs into the lungs every 4 (four) hours as needed for wheezing. Patient not taking: Reported on 01/20/2017 09/22/16   Tower, Wynelle Fanny, MD  folic acid (FOLVITE) 1 MG tablet Take 1 mg by mouth daily. 09/05/16   [provider]  potassium chloride SA (K-DUR,KLOR-CON) 20 MEQ  tablet Take 0.5 tablets (10 mEq total) by mouth 2 (two) times daily. Take with food Patient not taking: Reported on 01/20/2017 09/22/16   Abner Greenspan, MD   No Known Allergies Review of Systems  Unable to perform ROS: Acuity of condition    Physical Exam  Constitutional: She appears well-developed. She appears lethargic. She appears ill. Cervical collar in place.  Cardiovascular: Tachycardia present.  Pulmonary/Chest: She has decreased breath sounds.  Neurological: She appears lethargic.  Skin: Skin is warm and dry.  -noted facial bruising     Vital Signs: BP (!) 161/92 (BP Location: Right Arm)   Pulse (!) 117   Temp (!) 100.7 F (38.2 C) (Axillary)   Resp 18   Ht 5\' 4"  (1.626 m)   Wt 85.8 kg (189 lb 4 oz)   SpO2 99%   BMI 32.48 kg/m  Pain Assessment: PAINAD   Pain Score: 0-No pain   SpO2: SpO2: 99 % O2 Device:SpO2: 99 % O2 Flow Rate: .O2 Flow Rate (L/min): 1 L/min  IO: Intake/output summary:   Intake/Output Summary (Last 24 hours) at 01/23/2017 0834 Last data filed at 01/23/2017 8250 Gross per 24 hour  Intake 2971.45 ml  Output 1675 ml  Net 1296.45 ml    LBM: Last BM Date: 01/13/17 Baseline Weight: Weight: 72.6 kg (160 lb) Most recent weight: Weight: 85.8 kg (189 lb 4 oz)     Palliative Assessment/Data: 20 %    Discussed with Dr Darvin Neighbours and Dr Doy Mince  Time In: 0930 Time Out: 5397 Time Total: 75 min Greater than 50%  of this time was spent counseling and coordinating care related to the above assessment and plan.  Signed by: Wadie Lessen, NP   Please contact Palliative Medicine Team phone at [phone] for questions and concerns.  For individual provider: See Shea Evans

## 2017-01-23 NOTE — Progress Notes (Signed)
[facility] [place] [facility] NAME: Stephanie Raymond    MR#:  [id number]  DATE OF BIRTH:  11-03-1955  SUBJECTIVE:   She is lethargic. Moans at times  REVIEW OF SYSTEMS:    Unable to obtain as patient is lethargic  DRUG ALLERGIES:  No Known Allergies  VITALS:  Blood pressure (!) 161/92, pulse (!) 117, temperature (!) 101 F (38.3 C), temperature source Axillary, resp. rate 18, height 5\' 4"  (1.626 m), weight 85.8 kg (189 lb 4 oz), SpO2 99 %.  PHYSICAL EXAMINATION:  Constitutional: Appears chronically ill appearing she is lethargic somnolent She has a cervical collar placed HENT: Normocephalic. Marland Kitchen Oropharynx is clear and moist.  Eyes: Conjunctivae and EOM are normal. PERRLA, no scleral icterus.  Neck: Normal ROM. Neck supple. No JVD. No tracheal deviation. CVS: RRR, S1/S2 +, no murmurs, no gallops, no carotid bruit.  Pulmonary: Effort and breath sounds normal, no stridor, rhonchi, wheezes, rales.  Abdominal: Soft. BS +,  no distension, tenderness, rebound or guarding.  Musculoskeletal:  No edema and no tenderness.  Neuro: Somnolent Skin: Also bruises noted  Psychiatric: Somnolent  LABORATORY PANEL:   CBC Recent Labs  Lab 01/23/17 0422  WBC 6.4  HGB 11.5*  HCT 34.4*  PLT 69*   ------------------------------------------------------------------------------------------------------------------  Chemistries  Recent Labs  Lab 01/21/17 0614  01/23/17 0422  NA 134*   < > 129*  K 4.5   < > 4.4  CL 103   < > 100*  CO2 21*   < > 19*  GLUCOSE 98   < > 104*  BUN 8   < > 10  CREATININE 0.88   < > 0.88  CALCIUM 9.2   < > 8.6*  MG 3.0*   < > 1.5*  AST 27  --   --   ALT 10*  --   --   ALKPHOS 69  --   --   BILITOT 1.8*  --   --    < > = values in this interval not displayed.   ------------------------------------------------------------------------------------------------------------------  Cardiac Enzymes Recent Labs  Lab  01/20/17 1423  TROPONINI <0.03   ------------------------------------------------------------------------------------------------------------------  RADIOLOGY:  Dg Chest Port 1 View  Result Date: 01/22/2017 CLINICAL DATA:  Possible aspiration.  Hypoxia. EXAM: PORTABLE CHEST 1 VIEW COMPARISON:  Chest radiograph 12/24/2013. FINDINGS: Normal cardiomediastinal silhouette. Tortuous thoracic aorta. Chronic elevation RIGHT hemidiaphragm. Small RIGHT effusion. There may be mild basilar atelectasis on the RIGHT, but no definite consolidation. I see no pneumothorax. Bones are unremarkable. IMPRESSION: Chronic elevation RIGHT hemidiaphragm. There may be a small effusion and mild basilar atelectasis on the RIGHT. No definite consolidation. Electronically Signed   By: Staci Righter M.D.   On: 01/22/2017 09:47     ASSESSMENT AND PLAN:   61 year old female with history of EtOH dependence, cervical fractures with cervical spine collar place and hypothyroidism who presents with acute encephalopathy.  * Sepsis Source is unknown.  Does have fever and tachycardia.  WBC normal.  started vancomycin, ceftriaxone and acyclovir to cover for meningitis.  Thrombocytopenia does not allow for LP at this time. B cx pending  *Acute toxic and metabolic encephalopathy seems multifactorial.  Due to infection, alcohol, cognitive decline due to alcohol. On thiamine and folic acid. EEG - generalized slowing.  * Generalized weakness due to chronic alcoholism and adult failure to thrive with frequent falls Physical therapy consultation once patient is awake and alert  * Alcohol abuse.  Last drink was 10  days back according to husband.  No suspicion for withdrawals at this time.  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  We will continue aggressive care for now as discussed with family.  If no improvement patient will be transitioned to comfort  care.  CODE STATUS: DNR  TOTAL TIME TAKING CARE OF THIS PATIENT: 30 minutes.   Leia Alf Agustine Rossitto M.D on 01/23/2017 at 10:57 AM  Between 7am to 6pm - Pager - [phone]  After 6pm go to [email or web address] - password [email or web address] [facility]  Office  [phone]  CC: Primary care physician; Tower, Wynelle Fanny, MD  Note: This dictation was prepared with Dragon dictation along with smaller phrase technology. Any transcriptional errors that result from this process are unintentional.

## 2017-01-23 NOTE — Progress Notes (Signed)
Subjective: Patient not significantly changed from yesterday.  Has been started on antibiotics.    Objective: Current vital signs: BP (!) 161/92 (BP Location: Right Arm)   Pulse (!) 117   Temp (!) 101 F (38.3 C) (Axillary)   Resp 18   Ht 5\' 4"  (1.626 m)   Wt 85.8 kg (189 lb 4 oz)   SpO2 99%   BMI 32.48 kg/m  Vital signs in last 24 hours: Temp:  [98.9 F (37.2 C)-102.9 F (39.4 C)] 101 F (38.3 C) (12/04 0836) Pulse Rate:  [102-117] 117 (12/04 0404) Resp:  [17-20] 18 (12/04 0404) BP: (115-161)/(69-92) 161/92 (12/04 0404) SpO2:  [95 %-100 %] 99 % (12/04 0404)  Intake/Output from previous day: 12/03 0701 - 12/04 0700 In: 2971.5 [I.V.:2231.3; IV Piggyback:740.2] Out: 2542 [Urine:1675] Intake/Output this shift: Total I/O In: -  Out: 350 [Urine:350] Nutritional status: Diet NPO time specified  Neurologic Exam: Mental Status: Eyes closed.  Does not respond to verbal stimuli.  Opens eyes, groans and localizes to deep sternal rub.    Cranial Nerves: II: patient does not respond confrontation bilaterally, pupils right 4 mm, left 4 mm,and reactive bilaterally III,IV,VI: doll's response present bilaterally, mild right ptosis  V,VII: corneal reflex present bilaterally  VIII: patient does not respond to verbal stimuli IX,X: gag reflex reduced, XI: trapezius strength unable to test bilaterally XII: tongue strength unable to test Motor: Moves all extremities to painful stimuli but not spontaneously or to command Sensory: Does not respond to noxious stimuli in the upper extremities but responds to noxious stimuli in the lower extremities.  Lab Results: Basic Metabolic Panel: Recent Labs  Lab 01/20/17 1423 01/21/17 0614 01/21/17 1818 01/22/17 0435 01/23/17 0422  NA 140 134*  --  132* 129*  K 2.4* 4.5 4.7 4.7 4.4  CL 113* 103  --  102 100*  CO2 20* 21*  --  21* 19*  GLUCOSE 68 98  --  91 104*  BUN 9 8  --  9 10  CREATININE 0.64 0.88  --  0.83 0.88  CALCIUM 6.4* 9.2  --   8.6* 8.6*  MG 0.9* 3.0*  --  1.7 1.5*  PHOS  --  2.0*  --  2.9 2.8    Liver Function Tests: Recent Labs  Lab 01/20/17 1423 01/21/17 0614  AST 17 27  ALT 9* 10*  ALKPHOS 49 69  BILITOT 1.2 1.8*  PROT 4.8* 7.3  ALBUMIN 2.3* 3.3*   No results for input(s): LIPASE, AMYLASE in the last 168 hours. Recent Labs  Lab 01/20/17 1449  AMMONIA 13    CBC: Recent Labs  Lab 01/20/17 1423 01/21/17 0614 01/22/17 0447 01/23/17 0422  WBC 2.4* 5.6 8.7 6.4  NEUTROABS 1.5  --  6.2 5.3  HGB 8.6* 12.3 11.9* 11.5*  HCT 26.0* 36.9 35.6 34.4*  MCV 98.8 98.3 100.1* 96.7  PLT 53* 70* 64* 69*    Cardiac Enzymes: Recent Labs  Lab 01/20/17 1423  CKTOTAL 28*  TROPONINI <0.03    Lipid Panel: No results for input(s): CHOL, TRIG, HDL, CHOLHDL, VLDL, LDLCALC in the last 168 hours.  CBG: No results for input(s): GLUCAP in the last 168 hours.  Microbiology: Results for orders placed or performed during the hospital encounter of 01/20/17  CULTURE, BLOOD (ROUTINE X 2) w Reflex to ID Panel     Status: None (Preliminary result)   Collection Time: 01/22/17  8:07 AM  Result Value Ref Range Status   Specimen Description BLOOD BLOOD RIGHT  WRIST  Final   Special Requests   Final    BOTTLES DRAWN AEROBIC AND ANAEROBIC Blood Culture adequate volume   Culture NO GROWTH 1 DAY  Final   Report Status PENDING  Incomplete  CULTURE, BLOOD (ROUTINE X 2) w Reflex to ID Panel     Status: None (Preliminary result)   Collection Time: 01/22/17  8:24 AM  Result Value Ref Range Status   Specimen Description BLOOD BLOOD RIGHT HAND  Final   Special Requests   Final    BOTTLES DRAWN AEROBIC AND ANAEROBIC Blood Culture adequate volume   Culture NO GROWTH < 24 HOURS  Final   Report Status PENDING  Incomplete    Coagulation Studies: Recent Labs    01/20/17 1423  LABPROT 20.4*  INR 1.76    Imaging: Dg Chest Port 1 View  Result Date: 01/22/2017 CLINICAL DATA:  Possible aspiration.  Hypoxia. EXAM: PORTABLE  CHEST 1 VIEW COMPARISON:  Chest radiograph 12/24/2013. FINDINGS: Normal cardiomediastinal silhouette. Tortuous thoracic aorta. Chronic elevation RIGHT hemidiaphragm. Small RIGHT effusion. There may be mild basilar atelectasis on the RIGHT, but no definite consolidation. I see no pneumothorax. Bones are unremarkable. IMPRESSION: Chronic elevation RIGHT hemidiaphragm. There may be a small effusion and mild basilar atelectasis on the RIGHT. No definite consolidation. Electronically Signed   By: Staci Righter M.D.   On: 01/22/2017 09:47    Medications:  I have reviewed the patient's current medications. Scheduled: . cyanocobalamin  1,000 mcg Subcutaneous Daily  . folic acid  1 mg Oral Daily  . levothyroxine  75 mcg Oral QAC breakfast  . multivitamin with minerals  1 tablet Oral Daily  . nicotine  21 mg Transdermal Daily  . pantoprazole  40 mg Oral QAC breakfast    Assessment/Plan: Patient unchanged.  Continued metabolic issues.  Febrile.  Currently on Acyclovir, Rocephin and Vancomycin.  EEG only significant for slowing.  B1 remains pending.    Recommendations: 1.  MRI of the brain    LOS: 3 days   Alexis Goodell, MD Neurology [id number] 01/23/2017  11:02 AM

## 2017-01-23 NOTE — Progress Notes (Signed)
PT Cancellation Note  Patient Details Name: Stephanie Raymond MRN: [id number] DOB: Mar 27, 1955   Cancelled Treatment:    Reason Eval/Treat Not Completed: Fatigue/lethargy limiting ability to participate; Spoke to pt's nurse prior to entering pt's room with nursing requesting no PT services this date secondary to pt lethargic and unable to follow commands at this time.  Will attempt to see pt at a future date as appropriate.      DRoyetta Asal PT, DPT 01/23/17, 1:16 PM

## 2017-01-24 ENCOUNTER — Inpatient Hospital Stay (HOSPITAL_COMMUNITY)
Admit: 2017-01-24 | Discharge: 2017-01-24 | Disposition: A | Discharge status: Home / Self Care | Payer: Medicare HMO | Attending: Internal Medicine | Admitting: Internal Medicine

## 2017-01-24 ENCOUNTER — Inpatient Hospital Stay: Payer: Medicare HMO

## 2017-01-24 DIAGNOSIS — G459 Transient cerebral ischemic attack, unspecified: Secondary | ICD-10-CM

## 2017-01-24 DIAGNOSIS — I639 Cerebral infarction, unspecified: Secondary | ICD-10-CM

## 2017-01-24 LAB — CBC WITH DIFFERENTIAL/PLATELET
BASOS ABS: 0 10*3/uL (ref 0–0.1)
BASOS PCT: 1 %
EOS ABS: 0.1 10*3/uL (ref 0–0.7)
Eosinophils Relative: 3 %
HEMATOCRIT: 32.2 % — AB (ref 35.0–47.0)
HEMOGLOBIN: 11 g/dL — AB (ref 12.0–16.0)
Lymphocytes Relative: 10 %
Lymphs Abs: 0.4 10*3/uL — ABNORMAL LOW (ref 1.0–3.6)
MCH: 32.7 pg (ref 26.0–34.0)
MCHC: 34 g/dL (ref 32.0–36.0)
MCV: 96.2 fL (ref 80.0–100.0)
Monocytes Absolute: 0.4 10*3/uL (ref 0.2–0.9)
Monocytes Relative: 8 %
NEUTROS ABS: 3.5 10*3/uL (ref 1.4–6.5)
NEUTROS PCT: 78 %
Platelets: 66 10*3/uL — ABNORMAL LOW (ref 150–440)
RBC: 3.35 MIL/uL — AB (ref 3.80–5.20)
RDW: 14.5 % (ref 11.5–14.5)
WBC: 4.5 10*3/uL (ref 3.6–11.0)

## 2017-01-24 LAB — COMPREHENSIVE METABOLIC PANEL
ALBUMIN: 2.7 g/dL — AB (ref 3.5–5.0)
ALK PHOS: 57 U/L (ref 38–126)
ALT: 12 U/L — AB (ref 14–54)
ANION GAP: 8 (ref 5–15)
AST: 30 U/L (ref 15–41)
BUN: 9 mg/dL (ref 6–20)
CALCIUM: 8.4 mg/dL — AB (ref 8.9–10.3)
CO2: 21 mmol/L — AB (ref 22–32)
Chloride: 103 mmol/L (ref 101–111)
Creatinine, Ser: 0.77 mg/dL (ref 0.44–1.00)
GFR calc Af Amer: >60 mL/min (ref >60)
GFR calc non Af Amer: >60 mL/min (ref >60)
GLUCOSE: 78 mg/dL (ref 65–99)
Potassium: 4.3 mmol/L (ref 3.5–5.1)
SODIUM: 132 mmol/L — AB (ref 135–145)
Total Bilirubin: 0.9 mg/dL (ref 0.3–1.2)
Total Protein: 6.2 g/dL — ABNORMAL LOW (ref 6.5–8.1)

## 2017-01-24 LAB — MAGNESIUM: MAGNESIUM: 1.8 mg/dL (ref 1.7–2.4)

## 2017-01-24 LAB — PHOSPHORUS: Phosphorus: 3.8 mg/dL (ref 2.5–4.6)

## 2017-01-24 MED ORDER — ASPIRIN 300 MG RE SUPP
300.0000 mg | Freq: Every day | RECTAL | Status: DC
  Administered 2017-01-24: 13:00:00 300 mg via RECTAL
  Filled 2017-01-24 (×2): qty 1

## 2017-01-24 NOTE — Progress Notes (Signed)
Subjective: Patient opening eyes at times but otherwise unchanged.    Objective: Current vital signs: BP (!) 110/48 (BP Location: Right Arm)   Pulse 84   Temp 99.1 F (37.3 C) (Oral)   Resp 16   Ht 5\' 4"  (1.626 m)   Wt 85.8 kg (189 lb 4 oz)   SpO2 98%   BMI 32.48 kg/m  Vital signs in last 24 hours: Temp:  [98.4 F (36.9 C)-99.1 F (37.3 C)] 99.1 F (37.3 C) (12/04 1956) Pulse Rate:  [84-87] 84 (12/04 1956) Resp:  [16] 16 (12/04 1956) BP: (105-110)/(48-59) 110/48 (12/04 1956) SpO2:  [98 %] 98 % (12/04 1956)  Intake/Output from previous day: 12/04 0701 - 12/05 0700 In: 1394.2 [I.V.:830.8; IV Piggyback:563.4] Out: 2425 [Urine:2425] Intake/Output this shift: No intake/output data recorded. Nutritional status: Diet NPO time specified  Neurologic Exam: Mental Status: Eyes closed but does open with verbal stimuli. Opens eyes, groans and localizes to deep sternal rub.  Cranial Nerves: II: patient does not respond confrontation bilaterally, pupils right103mm, left 49mm,and reactivebilaterally III,IV,VI: doll's responsepresent bilaterally, mild right ptosis V,VII: corneal reflexpresentbilaterally VIII: patient does not respond to verbal stimuli IX,X: gag reflexreduced, XI: trapezius strength unable to test bilaterally XII: tongue strength unable to test Motor: Moves all extremities to painful stimuli but not spontaneously or to command Sensory: Does not respond to noxious stimuli inthe upper extremities but responds to noxious stimuli in the lower extremities  Lab Results: Basic Metabolic Panel: Recent Labs  Lab 01/20/17 1423 01/21/17 0614 01/21/17 1818 01/22/17 0435 01/23/17 0422 01/24/17 0424  NA 140 134*  --  132* 129* 132*  K 2.4* 4.5 4.7 4.7 4.4 4.3  CL 113* 103  --  102 100* 103  CO2 20* 21*  --  21* 19* 21*  GLUCOSE 68 98  --  91 104* 78  BUN 9 8  --  9 10 9   CREATININE 0.64 0.88  --  0.83 0.88 0.77  CALCIUM 6.4* 9.2  --  8.6* 8.6* 8.4*  MG  0.9* 3.0*  --  1.7 1.5* 1.8  PHOS  --  2.0*  --  2.9 2.8 3.8    Liver Function Tests: Recent Labs  Lab 01/20/17 1423 01/21/17 0614 01/24/17 0424  AST 17 27 30   ALT 9* 10* 12*  ALKPHOS 49 69 57  BILITOT 1.2 1.8* 0.9  PROT 4.8* 7.3 6.2*  ALBUMIN 2.3* 3.3* 2.7*   No results for input(s): LIPASE, AMYLASE in the last 168 hours. Recent Labs  Lab 01/20/17 1449  AMMONIA 13    CBC: Recent Labs  Lab 01/20/17 1423 01/21/17 0614 01/22/17 0447 01/23/17 0422 01/24/17 0424  WBC 2.4* 5.6 8.7 6.4 4.5  NEUTROABS 1.5  --  6.2 5.3 3.5  HGB 8.6* 12.3 11.9* 11.5* 11.0*  HCT 26.0* 36.9 35.6 34.4* 32.2*  MCV 98.8 98.3 100.1* 96.7 96.2  PLT 53* 70* 64* 69* 66*    Cardiac Enzymes: Recent Labs  Lab 01/20/17 1423  CKTOTAL 28*  TROPONINI <0.03    Lipid Panel: No results for input(s): CHOL, TRIG, HDL, CHOLHDL, VLDL, LDLCALC in the last 168 hours.  CBG: No results for input(s): GLUCAP in the last 168 hours.  Microbiology: Results for orders placed or performed during the hospital encounter of 01/20/17  CULTURE, BLOOD (ROUTINE X 2) w Reflex to ID Panel     Status: None (Preliminary result)   Collection Time: 01/22/17  8:07 AM  Result Value Ref Range Status   Specimen Description BLOOD  BLOOD RIGHT WRIST  Final   Special Requests   Final    BOTTLES DRAWN AEROBIC AND ANAEROBIC Blood Culture adequate volume   Culture NO GROWTH 2 DAYS  Final   Report Status PENDING  Incomplete  CULTURE, BLOOD (ROUTINE X 2) w Reflex to ID Panel     Status: None (Preliminary result)   Collection Time: 01/22/17  8:24 AM  Result Value Ref Range Status   Specimen Description BLOOD BLOOD RIGHT HAND  Final   Special Requests   Final    BOTTLES DRAWN AEROBIC AND ANAEROBIC Blood Culture adequate volume   Culture NO GROWTH 2 DAYS  Final   Report Status PENDING  Incomplete    Coagulation Studies: No results for input(s): LABPROT, INR in the last 72 hours.  Imaging: Mr Brain Wo Contrast  Result Date:  01/23/2017 CLINICAL DATA:  Altered mental status. Ceased alcohol consumption 1 week ago. History of hypertension, vasculitis and alcohol abuse. EXAM: MRI HEAD WITHOUT CONTRAST TECHNIQUE: Multiplanar, multiecho pulse sequences of the brain and surrounding structures were obtained without intravenous contrast. COMPARISON:  CT HEAD January 20, 2017. MRI of the head September 29, 2013. FINDINGS: BRAIN: 11 mm linear reduced diffusion RIGHT periventricular frontal white matter with normalized ADC values and FLAIR T2 hyperintense signal. No susceptibility artifact to suggest hemorrhage. Moderate ventriculomegaly on the basis of global parenchymal brain volume loss, mild ex vacuo dilatation RIGHT lateral ventricle. No hydrocephalus. 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 Old bilateral basal ganglia and LEFT thalamus lacunar infarcts. Confluent supratentorial white matter FLAIR T2 hyperintensities. Old pontine lacunar infarcts. No midline shift, mass effect or masses. No abnormal extra-axial fluid collections. VASCULAR: Normal major intracranial vascular flow voids present at skull base. SKULL AND UPPER CERVICAL SPINE: No abnormal sellar expansion. No suspicious calvarial bone marrow signal. Craniocervical junction maintained. SINUSES/ORBITS: Mild paranasal sinus mucosal thickening. Trace mastoid effusions. The included ocular globes and orbital contents are non-suspicious. OTHER: None. IMPRESSION: 1. 11 mm subacute RIGHT Corona radiata nonhemorrhagic infarct. 2. Moderate parenchymal brain volume loss, advanced for age. 3. Moderate to severe chronic small vessel ischemic disease. Old basal ganglia, LEFT thalamus and pontine lacunar infarcts. Electronically Signed   By: Elon Alas M.D.   On: 01/23/2017 14:57    Medications:  I have reviewed the patient's current medications. Scheduled: . cyanocobalamin  1,000 mcg Subcutaneous Daily  . folic acid  1 mg Intravenous Daily  . levothyroxine  75 mcg Oral QAC  breakfast  . nicotine  21 mg Transdermal Daily  . pantoprazole  40 mg Oral QAC breakfast    Assessment/Plan: Patient not significantly changed.  MRI of the brain reviewed and shows a small subacute right corona radiata infarct.  Continued antibiotic coverage.    Recommendations: 1. Echocardiogram pending 2. Carotid dopplers 3. ASA 300mg  rectally, daily 4. PT/OT, speech therapy as tolerated 5. Telemetry 6. HgbA1c, fasting lipid panel 7. Frequent neuro checks    LOS: 4 days   Alexis Goodell, MD Neurology [phone] 01/24/2017  11:30 AM

## 2017-01-24 NOTE — Progress Notes (Signed)
Pharmacy Antibiotic Note  Stephanie Raymond is a 61 y.o. female admitted on 01/20/2017 with empriric antibiotic coverage for meningitis.  Pharmacy has been consulted for Vancomycin and Acyclovir dosing. Thrombocytopenia does not allow for LP at this time.  Patient also on Ceftriaxone 2 gram IV q12h.  Plan: -Will order Acyclovir 670 mg IV q8h (dose of 10 mg/kg/dose based on Adjusted body weight of 67 kg). -Will order Vancomycin 1250 mg IV x 1 and continue with stacked dosing of: Vancomycin 1250 IV every 18 hours.  Goal trough 15-20 mcg/mL.  Ke 0.055  T1/2 12.6  Vd 47    Adj BW 67 kg Will order trough prior to 5th dose on 12/6 at 1430.  12/5: patient continues to be lethargic, MRI=Acute right corona radiata CVA. Source of sepsis still unknown. Tmax last 24 hrs improved = 99.5 F.     Height: 5\' 4"  (162.6 cm) Weight: 189 lb 4 oz (85.8 kg) IBW/kg (Calculated) : 54.7  Temp (24hrs), Avg:99.3 F (37.4 C), Min:99.1 F (37.3 C), Max:99.5 F (37.5 C)  Recent Labs  Lab 01/20/17 1423 01/21/17 0614 01/22/17 0435 01/22/17 0447 01/23/17 0422 01/24/17 0424  WBC 2.4* 5.6  --  8.7 6.4 4.5  CREATININE 0.64 0.88 0.83  --  0.88 0.77    Estimated Creatinine Clearance: 78.2 mL/min (by C-G formula based on SCr of 0.77 mg/dL).    No Known Allergies  Antimicrobials this admission: acyclovir 12/3 >>   Vanc 12/3 >>   CTX 12/3 >>  Dose adjustments this admission:    Microbiology results: 12/3 BCx: NG x2d   UCx:      Sputum:      MRSA PCR:   CXR: no consolidation  Thank you for allowing pharmacy to be a part of this patient's care.  Allycia Pitz A 01/24/2017 1:57 PM

## 2017-01-24 NOTE — Plan of Care (Signed)
  Progressing Education: Knowledge of General Education information will improve 01/24/2017 0255 - Progressing by Denice Bors, RN Pain Managment: General experience of comfort will improve 01/24/2017 0255 - Progressing by Denice Bors, RN Safety: Ability to remain free from injury will improve 01/24/2017 0255 - Progressing by Denice Bors, RN Skin Integrity: Risk for impaired skin integrity will decrease 01/24/2017 0255 - Progressing by Denice Bors, RN

## 2017-01-24 NOTE — Progress Notes (Signed)
SLP Cancellation Note  Patient Details Name: Stephanie Raymond MRN: [id number] DOB: [birth year]   Cancelled treatment:       Reason Eval/Treat Not Completed: Fatigue/lethargy limiting ability to participate;Medical issues which prohibited therapy;Patient at procedure or test/unavailable(chart reviewed; consulted NSG re: pt's status). Pt initially was lethargic and only responded w/ a moan to moderate verbal/tactile stim; eyes opened briefly. Upon checking back, pt is now out of the room for a test.  Pt appears to be at a declined Cognitive/Mental Status that would increase her risk for dysphagia and aspiration. Recommend pt remain NPO at this time; frequent oral care for hygiene and oral stimulation of swallowing. ST services will f/u w/ BSE tomorrow in hopes to establish least restrictive diet consistency. NSG updated.    Orinda Kenner, MS, CCC-SLP Stephanie Raymond 01/24/2017, 1:40 PM

## 2017-01-24 NOTE — Progress Notes (Signed)
Patient ID: Stephanie Raymond, female   DOB: 1955-10-07, 61 y.o.   MRN: [id number]  This NP visited patient at the bedside as a follow up to  yesterday's [place] for emotional support and palliative medicine needs.  Patient remains obtunded, unable to follow commands and speech recommends continued n.p.o. status secondary to her cognitive state.  I placed a call to her husband for continued conversation regarding the current medical situation, advanced directive decisions and treatment option decisions.  Has been verbalizes his concerns and belief that she is not going to "get better from this".  He states "she already knew she was sick" and expressed a  desire for no life prolonging interventions in the event of a long-term poor prognosis.  Plan is to meet tomorrow at 10:00 for further conversation regarding clarification of goals of care and long-term care planning.  Discussed with husband  the importance of continued conversation with their  medical providers regarding overall plan of care and treatment options,  ensuring decisions are within the context of the patients values and GOCs.  Questions and concerns addressed  Time in  1030        Time out  1105    Total time spent on the  Unit was 35 minutes Greater than 50% of the time was spent in counseling and coordination of care  Wadie Lessen NP  Palliative Medicine Team Team Phone # [phone] Pager [phone]

## 2017-01-24 NOTE — Progress Notes (Signed)
MEDICATION RELATED CONSULT NOTE - Follow up  Pharmacy Consult for electrolyte replacement Indication: hypokalemia, hypomagnesemia  No Known Allergies  Patient Measurements: Height: 5\' 4"  (162.6 cm) Weight: 189 lb 4 oz (85.8 kg) IBW/kg (Calculated) : 54.7 Adjusted Body Weight:   Vital Signs:   Intake/Output from previous day: 12/04 0701 - 12/05 0700 In: 1394.2 [I.V.:830.8; IV Piggyback:563.4] Out: 2425 [Urine:2425] Intake/Output from this shift: No intake/output data recorded.  Labs: Recent Labs    01/22/17 0435 01/22/17 0447 01/23/17 0422 01/24/17 0424  WBC  --  8.7 6.4 4.5  HGB  --  11.9* 11.5* 11.0*  HCT  --  35.6 34.4* 32.2*  PLT  --  64* 69* 66*  CREATININE 0.83  --  0.88 0.77  MG 1.7  --  1.5* 1.8  PHOS 2.9  --  2.8 3.8  ALBUMIN  --   --   --  2.7*  PROT  --   --   --  6.2*  AST  --   --   --  30  ALT  --   --   --  12*  ALKPHOS  --   --   --  57  BILITOT  --   --   --  0.9    Lab Results  Component Value Date   K 4.3 01/24/2017   Estimated Creatinine Clearance: 78.2 mL/min (by C-G formula based on SCr of 0.77 mg/dL).  Medical History: Past Medical History:  Diagnosis Date  . COPD (chronic obstructive pulmonary disease) ([place])   . Depression   . GERD (gastroesophageal reflux disease)   . History of CT scan 04/1998   small lacunar infarcts  . History of MRI of brain and brain stem 07/1998   white matter changes  . Hx of transient ischemic attack (TIA) 2000   Hosp ?  [facility] Hypertension   . Hypothyroidism   . Infertility, female   . Psoriasis    on humira  . 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 sodium chloride 50 mL/hr at 01/24/17 0100  . acyclovir Stopped (01/24/17 4709)  . cefTRIAXone (ROCEPHIN)  IV Stopped (01/23/17 2221)  . thiamine injection    . vancomycin Stopped (01/24/17 0421)    Assessment: 26 yof cc FTT, PMH depression, GERD, HTN, hypothyroidism, vasculitis, EtOH SUD. HPI generalized  weakness and confusion per husband. Recently stopped drinking with naltrexone and Librium. Severe electrolyte abnormalities noted in ED (K 2.4, Mg 0.9, Ca 6.4, albumin 2.3, adjusted calcium 7.7). Pharmacy consulted to manage electrolyte replacement.   Goal of Therapy:  K 3.5 to 5 (goal > 4) Magnesium 1.7 to 2.4 (goal >2) Calcium 8.9 to 10.3 Phosphorus 2.5 to 4.9   12/1: 1. Potassium chloride 10 mEq IV Q1H x 6 doses (per EDP); will give additonal KCl 10 mEq IV Q1H x 2 doses for a total of 80 mEq IV 2. Magnesium 4 gm IV x 1 (per hospitalist); will give additional magnesium 4 gm IV x 1 for a total of 8 gm IV 3. Calcium gluconate 2 gm IV x 1 (per hospitalist) 4. Recheck all electrolytes with AM labs  Plan:  K 4.5, Mag 3.0, Phos 2.0, Ca 9.2- low phos, high mag Will d/c mag oxide for now K phos neutral tab 500 mg PO q4h x3 doses  Recheck K at 1800 to check WNL and recheck all electrolytes in AM  01/21/17 1818 K 4.7. No further supplement indicated at  this time. Will remove KCl from IVF per Dr. Posey Pronto.   12/3  K 4.7  Mag 1.7  Phos 2.9.  Will order Magnesium sulfate 2 gram IV x 1. Will f/u labs in am.  12/4  K 4.4  Mag 1.5  Phos 2.8  Will order Magnesium 4 gram IV x 1. Will f/u labs in am  12/5 K 4.3  Mag 1.8 Phos 3.8 Ca 8.4 albumin 2.7. Electrolytes WNL. Will f/u labs in am.  Labrisha Wuellner A, Pharm.D., BCPS Clinical Pharmacist 01/24/2017,8:46 AM

## 2017-01-24 NOTE — Progress Notes (Signed)
PT Cancellation Note  Patient Details Name: Stephanie Raymond MRN: [id number] DOB: 09-25-1955   Cancelled Treatment:    Reason Eval/Treat Not Completed: Fatigue/lethargy limiting ability to participate; Per nursing pt remains inappropriate for PT services at this time secondary to lethargy and inability to follow commands.  Will attempt to see pt at a future date/time as appropriate.    Linus Salmons PT, DPT 01/24/17, 12:15 PM

## 2017-01-24 NOTE — Progress Notes (Signed)
[facility] [place] NAME: Stephanie Raymond    MR#:  [id number]  DATE OF BIRTH:  12/24/1955  SUBJECTIVE:   She is lethargic. Opens eyes.  Husband at bedside.  REVIEW OF SYSTEMS:    Unable to obtain as patient is lethargic.  DRUG ALLERGIES:  No Known Allergies  VITALS:  Blood pressure (!) 110/48, pulse 84, temperature 99.1 F (37.3 C), temperature source Oral, resp. rate 16, height 5\' 4"  (1.626 m), weight 85.8 kg (189 lb 4 oz), SpO2 98 %.  PHYSICAL EXAMINATION:  Constitutional: Appears chronically ill appearing she is lethargic somnolent She has a cervical collar placed HENT: Normocephalic. Marland Kitchen Oropharynx is clear and moist.  Eyes: Conjunctivae and EOM are normal. PERRLA, no scleral icterus.  Neck: Normal ROM. Neck supple. No JVD. No tracheal deviation. CVS: RRR, S1/S2 +, no murmurs, no gallops, no carotid bruit.  Pulmonary: Effort and breath sounds normal, no stridor, rhonchi, wheezes, rales.  Abdominal: Soft. BS +,  no distension, tenderness, rebound or guarding.  Musculoskeletal:  No edema and no tenderness.  Neuro: Somnolent Skin: Also bruises noted  Psychiatric: Somnolent  LABORATORY PANEL:   CBC Recent Labs  Lab 01/24/17 0424  WBC 4.5  HGB 11.0*  HCT 32.2*  PLT 66*   ------------------------------------------------------------------------------------------------------------------  Chemistries  Recent Labs  Lab 01/24/17 0424  NA 132*  K 4.3  CL 103  CO2 21*  GLUCOSE 78  BUN 9  CREATININE 0.77  CALCIUM 8.4*  MG 1.8  AST 30  ALT 12*  ALKPHOS 57  BILITOT 0.9   ------------------------------------------------------------------------------------------------------------------  Cardiac Enzymes Recent Labs  Lab 01/20/17 1423  TROPONINI <0.03   ------------------------------------------------------------------------------------------------------------------  RADIOLOGY:  Mr Brain Wo Contrast  Result  Date: 01/23/2017 CLINICAL DATA:  Altered mental status. Ceased alcohol consumption 1 week ago. History of hypertension, vasculitis and alcohol abuse. EXAM: MRI HEAD WITHOUT CONTRAST TECHNIQUE: Multiplanar, multiecho pulse sequences of the brain and surrounding structures were obtained without intravenous contrast. COMPARISON:  CT HEAD January 20, 2017. MRI of the head September 29, 2013. FINDINGS: BRAIN: 11 mm linear reduced diffusion RIGHT periventricular frontal white matter with normalized ADC values and FLAIR T2 hyperintense signal. No susceptibility artifact to suggest hemorrhage. Moderate ventriculomegaly on the basis of global parenchymal brain volume loss, mild ex vacuo dilatation RIGHT lateral ventricle. No hydrocephalus. 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 Old bilateral basal ganglia and LEFT thalamus lacunar infarcts. Confluent supratentorial white matter FLAIR T2 hyperintensities. Old pontine lacunar infarcts. No midline shift, mass effect or masses. No abnormal extra-axial fluid collections. VASCULAR: Normal major intracranial vascular flow voids present at skull base. SKULL AND UPPER CERVICAL SPINE: No abnormal sellar expansion. No suspicious calvarial bone marrow signal. Craniocervical junction maintained. SINUSES/ORBITS: Mild paranasal sinus mucosal thickening. Trace mastoid effusions. The included ocular globes and orbital contents are non-suspicious. OTHER: None. IMPRESSION: 1. 11 mm subacute RIGHT Corona radiata nonhemorrhagic infarct. 2. Moderate parenchymal brain volume loss, advanced for age. 3. Moderate to severe chronic small vessel ischemic disease. Old basal ganglia, LEFT thalamus and pontine lacunar infarcts. Electronically Signed   By: Elon Alas M.D.   On: 01/23/2017 14:57     ASSESSMENT AND PLAN:   61 year old female with history of EtOH dependence, cervical fractures with cervical spine collar place and hypothyroidism who presents with acute encephalopathy.  * Acute  right corona radiata CVA Neurology consulted ASA, Echo, carotids PT/Speech  * Sepsis Source is unknown.  Does have fever and tachycardia.  WBC  normal.  started vancomycin, ceftriaxone and acyclovir to cover for meningitis.  Thrombocytopenia does not allow for LP at this time. B cx pending  *Acute toxic and metabolic encephalopathy seems multifactorial.  Due to infection, alcohol, cognitive decline due to alcohol. On thiamine and folic acid. EEG - generalized slowing.  * Generalized weakness due to chronic alcoholism and adult failure to thrive with frequent falls Physical therapy consultation once patient is awake and alert  * Alcohol abuse.  Last drink was 10 days prior to admission. According to husband.  No suspicion for withdrawals at this time.  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 If no improvement transition to comfort care. Speech eval to determine swallowing  Management plans discussed with the patient's husband and he is in agreement.  CODE STATUS: DNR  TOTAL TIME TAKING CARE OF THIS PATIENT: 30 minutes.   Leia Alf Awad Gladd M.D on 01/24/2017 at 1:14 PM  Between 7am to 6pm - Pager - [phone]  After 6pm go to [email or web address] - password [email or web address] [facility]  Office  [phone]  CC: Primary care physician; Tower, Wynelle Fanny, MD  Note: This dictation was prepared with Dragon dictation along with smaller phrase technology. Any transcriptional errors that result from this process are unintentional.

## 2017-01-24 NOTE — Progress Notes (Signed)
Notified by central telemetry patient had burst of SVT in 200s for 19 beats. Pt back to sinus tachycardia low 100s. Primary RN Maddy and MD Dr. Darvin Neighbours notified.

## 2017-01-25 ENCOUNTER — Ambulatory Visit: Payer: Medicare HMO

## 2017-01-25 DIAGNOSIS — K117 Disturbances of salivary secretion: Secondary | ICD-10-CM

## 2017-01-25 LAB — BASIC METABOLIC PANEL
ANION GAP: 10 (ref 5–15)
BUN: 11 mg/dL (ref 6–20)
CHLORIDE: 104 mmol/L (ref 101–111)
CO2: 20 mmol/L — AB (ref 22–32)
Calcium: 8.6 mg/dL — ABNORMAL LOW (ref 8.9–10.3)
Creatinine, Ser: 0.73 mg/dL (ref 0.44–1.00)
GFR calc Af Amer: >60 mL/min (ref >60)
GLUCOSE: 76 mg/dL (ref 65–99)
POTASSIUM: 3.7 mmol/L (ref 3.5–5.1)
Sodium: 134 mmol/L — ABNORMAL LOW (ref 135–145)

## 2017-01-25 LAB — LIPID PANEL
CHOLESTEROL: 101 mg/dL (ref 0–200)
HDL: 14 mg/dL — ABNORMAL LOW (ref >40)
LDL Cholesterol: 60 mg/dL (ref 0–99)
Total CHOL/HDL Ratio: 7.2 RATIO
Triglycerides: 137 mg/dL (ref <150)
VLDL: 27 mg/dL (ref 0–40)

## 2017-01-25 LAB — ECHOCARDIOGRAM COMPLETE
Height: 64 in
Weight: 3028 oz

## 2017-01-25 LAB — HEMOGLOBIN A1C
Hgb A1c MFr Bld: 4.3 % — ABNORMAL LOW (ref 4.8–5.6)
MEAN PLASMA GLUCOSE: 76.71 mg/dL

## 2017-01-25 LAB — MAGNESIUM: MAGNESIUM: 1.5 mg/dL — AB (ref 1.7–2.4)

## 2017-01-25 LAB — PHOSPHORUS: PHOSPHORUS: 3.3 mg/dL (ref 2.5–4.6)

## 2017-01-25 MED ORDER — MORPHINE SULFATE (CONCENTRATE) 10 MG/0.5ML PO SOLN: 5.0000 mg | ORAL | Status: DC | PRN

## 2017-01-25 MED ORDER — ONDANSETRON HCL 4 MG PO TABS: 4.0000 mg | ORAL_TABLET | Freq: Four times a day (QID) | ORAL | 0 refills | Status: AC | PRN

## 2017-01-25 MED ORDER — MORPHINE SULFATE (CONCENTRATE) 10 MG/0.5ML PO SOLN: 5.0000 mg | ORAL | Status: AC | PRN

## 2017-01-25 MED ORDER — ACETAMINOPHEN 325 MG PO TABS: 650.0000 mg | ORAL_TABLET | Freq: Four times a day (QID) | ORAL | Status: AC | PRN

## 2017-01-25 MED ORDER — GLYCOPYRROLATE 0.2 MG/ML IJ SOLN: 0.4000 mg | Freq: Three times a day (TID) | INTRAMUSCULAR | Status: AC

## 2017-01-25 MED ORDER — GLYCOPYRROLATE 0.2 MG/ML IJ SOLN
0.4000 mg | Freq: Three times a day (TID) | INTRAMUSCULAR | Status: DC
  Filled 2017-01-25 (×3): qty 2

## 2017-01-25 NOTE — Discharge Summary (Signed)
[facility] at [place] NAME: Stephanie Raymond    MR#:  [id number]  DATE OF BIRTH:  [birth year]  DATE OF ADMISSION:  01/20/2017 ADMITTING PHYSICIAN: Dustin Flock, MD  DATE OF DISCHARGE: 01/25/17  PRIMARY CARE PHYSICIAN: Tower, Wynelle Fanny, MD    ADMISSION DIAGNOSIS:  Alcohol withdrawal delirium ([place]) [F10.231] Hypokalemia [E87.6] Encephalopathy acute [G93.40]  DISCHARGE DIAGNOSIS:  Active Problems:   Confusion   Encephalopathy acute   Palliative care by specialist   DNR (do not resuscitate)   H/O ETOH abuse   Cerebrovascular accident (CVA) ([place])   SECONDARY DIAGNOSIS:   Past Medical History:  Diagnosis Date  . COPD (chronic obstructive pulmonary disease) ([place])   . Depression   . GERD (gastroesophageal reflux disease)   . History of CT scan 04/1998   small lacunar infarcts  . History of MRI of brain and brain stem 07/1998   white matter changes  . Hx of transient ischemic attack (TIA) 2000   Hosp ?  Marland Kitchen Hypertension   . Hypothyroidism   . Infertility, female   . Psoriasis    on humira  . 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  * Acute right corona radiata CVA Neurology is following the patient PT/Speech-could not assess the patient as the patient is encephalopathic Patient is made comfort care today  * Sepsis Source is unknown.  Does have fever and tachycardia.  WBC normal.  started vancomycin, ceftriaxone and acyclovir to cover for meningitis.  Thrombocytopenia does not allow for LP at this time. B cx negative  *Acute toxic and metabolic encephalopathy seems multifactorial.  Due to infection, alcohol, cognitive decline due to alcohol. Patient was given thiamine and folic acid. EEG - generalized slowing.  *  Generalized weakness due to chronic alcoholism and adult failure to thrive with frequent falls Physical therapy consultation placed but patient is encephalopathic and she is made comfort care today  * Alcohol abuse.  Last drink was 10 days prior to admission according to husband.  No suspicion for withdrawals at this time.  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 Patient will be transferred to hospice home when bed is available, anticipating today  DISCHARGE CONDITIONS:   Guarded   CONSULTS OBTAINED:  Treatment Team:  Catarina Hartshorn, MD Alexis Goodell, MD   PROCEDURES EEG  DRUG ALLERGIES:  No Known Allergies  DISCHARGE MEDICATIONS:   Allergies as of 01/25/2017   No Known Allergies     Medication List    STOP taking these medications   albuterol 108 (90 Base) MCG/ACT inhaler Commonly known as:  PROVENTIL HFA;VENTOLIN HFA   buPROPion 300 MG 24 hr tablet Commonly known as:  WELLBUTRIN XL   CYANOCOBALAMIN PO   folic acid 1 MG tablet Commonly known as:  FOLVITE   furosemide 80 MG tablet Commonly known as:  LASIX   levothyroxine 75 MCG tablet Commonly known as:  SYNTHROID, LEVOTHROID   magnesium oxide 400 MG tablet Commonly known as:  MAG-OX   methotrexate 2.5 MG tablet Commonly known as:  RHEUMATREX   naltrexone 50 MG tablet Commonly known as:  DEPADE   potassium chloride SA 20  MEQ tablet Commonly known as:  K-DUR,KLOR-CON   Secukinumab 150 MG/ML Soaj   Spacer/Aero Chamber Mouthpiece Misc   VITAMIN D-3 PO     TAKE these medications   acetaminophen 325 MG tablet Commonly known as:  TYLENOL Take 2 tablets (650 mg total) by mouth every 6 (six) hours as needed for mild pain (or Fever >/= 101).   chlordiazePOXIDE 25 MG capsule Commonly known as:  LIBRIUM Take 1 capsule (25 mg total) by  mouth 3 (three) times daily as needed for withdrawal. Caution of sedation, do not drive with this   glycopyrrolate 0.2 MG/ML injection Commonly known as:  ROBINUL Inject 2 mLs (0.4 mg total) into the vein 3 (three) times daily.   morphine CONCENTRATE 10 MG/0.5ML Soln concentrated solution Take 0.25 mLs (5 mg total) by mouth every 2 (two) hours as needed for moderate pain, severe pain or shortness of breath.   omeprazole 20 MG capsule Commonly known as:  PRILOSEC Take 1 capsule by mouth  every day   ondansetron 4 MG tablet Commonly known as:  ZOFRAN Take 1 tablet (4 mg total) by mouth every 6 (six) hours as needed for nausea.        DISCHARGE INSTRUCTIONS:   Transfer patient to hospice home when bed is available  DIET:  Reg diet as tolerated if patient is awake and alert  DISCHARGE CONDITION:  guarded  ACTIVITY:  Activity as tolerated  OXYGEN:  Home Oxygen: No.   Oxygen Delivery: room air  DISCHARGE LOCATION:   Transfer patient to hospice home when bed is available   If you experience worsening of your admission symptoms, develop shortness of breath, life threatening emergency, suicidal or homicidal thoughts you must seek medical attention immediately by calling 911 or calling your MD immediately  if symptoms less severe.  You Must read complete instructions/literature along with all the possible adverse reactions/side effects for all the Medicines you take and that have been prescribed to you. Take any new Medicines after you have completely understood and accpet all the possible adverse reactions/side effects.   Please note  You were cared for by a hospitalist during your hospital stay. If you have any questions about your discharge medications or the care you received while you were in the hospital after you are discharged, you can call the unit and asked to speak with the hospitalist on call if the hospitalist that took care of you is not available. Once you are  discharged, your primary care physician will handle any further medical issues. Please note that NO REFILLS for any discharge medications will be authorized once you are discharged, as it is imperative that you return to your primary care physician (or establish a relationship with a primary care physician if you do not have one) for your aftercare needs so that they can reassess your need for medications and monitor your lab values.     Today  Chief Complaint  Patient presents with  . Failure To Thrive   Patient is delirious. Husband at bedside. Decided to change her CODE STATUS to comfort care as she is not clinically improving  ROS: Unobtainable as the patient is encephalopathic  VITAL SIGNS:  Blood pressure 137/70, pulse (!) 102, temperature 99.8 F (37.7 C), temperature source Oral, resp. rate 16, height 5\' 4"  (1.626 m), weight 85.8 kg (189 lb 4 oz), SpO2 100 %.  I/O:    Intake/Output Summary (Last 24 hours) at 01/25/2017 1327 Last data filed at 01/25/2017 0506 Gross  per 24 hour  Intake 2458.6 ml  Output 1050 ml  Net 1408.6 ml    PHYSICAL EXAMINATION:  GENERAL:  61 y.o.-year-old patient lying in the bed with no acute distress.  EYES: Pupils equal, round, reactive to light and accommodation. No scleral icterus. Extraocular muscles intact.  HEENT: Head atraumatic, normocephalic. Oropharynx and nasopharynx clear.  NECK:  Supple, no jugular venous distention. No thyroid enlargement, no tenderness.  LUNGS: Normal breath sounds bilaterally, no wheezing, rales,rhonchi or crepitation. No use of accessory muscles of respiration.  CARDIOVASCULAR: S1, S2 normal. No murmurs, rubs, or gallops.  ABDOMEN: Soft, non-tender, non-distended. Bowel sounds present.  EXTREMITIES: No pedal edema, cyanosis, or clubbing.  NEUROLOGIC: Encephalopathic PSYCHIATRIC: The patient is encephalopathic  SKIN: No obvious rash, lesion, or ulcer.   DATA REVIEW:   CBC Recent Labs  Lab 01/24/17 0424  WBC  4.5  HGB 11.0*  HCT 32.2*  PLT 66*    Chemistries  Recent Labs  Lab 01/24/17 0424 01/25/17 0426  NA 132* 134*  K 4.3 3.7  CL 103 104  CO2 21* 20*  GLUCOSE 78 76  BUN 9 11  CREATININE 0.77 0.73  CALCIUM 8.4* 8.6*  MG 1.8 1.5*  AST 30  --   ALT 12*  --   ALKPHOS 57  --   BILITOT 0.9  --     Cardiac Enzymes Recent Labs  Lab 01/20/17 1423  TROPONINI <0.03    Microbiology Results  Results for orders placed or performed during the hospital encounter of 01/20/17  CULTURE, BLOOD (ROUTINE X 2) w Reflex to ID Panel     Status: None (Preliminary result)   Collection Time: 01/22/17  8:07 AM  Result Value Ref Range Status   Specimen Description BLOOD BLOOD RIGHT WRIST  Final   Special Requests   Final    BOTTLES DRAWN AEROBIC AND ANAEROBIC Blood Culture adequate volume   Culture NO GROWTH 3 DAYS  Final   Report Status PENDING  Incomplete  CULTURE, BLOOD (ROUTINE X 2) w Reflex to ID Panel     Status: None (Preliminary result)   Collection Time: 01/22/17  8:24 AM  Result Value Ref Range Status   Specimen Description BLOOD BLOOD RIGHT HAND  Final   Special Requests   Final    BOTTLES DRAWN AEROBIC AND ANAEROBIC Blood Culture adequate volume   Culture NO GROWTH 3 DAYS  Final   Report Status PENDING  Incomplete    RADIOLOGY:  Mr Brain Wo Contrast  Result Date: 01/23/2017 CLINICAL DATA:  Altered mental status. Ceased alcohol consumption 1 week ago. History of hypertension, vasculitis and alcohol abuse. EXAM: MRI HEAD WITHOUT CONTRAST TECHNIQUE: Multiplanar, multiecho pulse sequences of the brain and surrounding structures were obtained without intravenous contrast. COMPARISON:  CT HEAD January 20, 2017. MRI of the head September 29, 2013. FINDINGS: BRAIN: 11 mm linear reduced diffusion RIGHT periventricular frontal white matter with normalized ADC values and FLAIR T2 hyperintense signal. No susceptibility artifact to suggest hemorrhage. Moderate ventriculomegaly on the basis of  global parenchymal brain volume loss, mild ex vacuo dilatation RIGHT lateral ventricle. No hydrocephalus. 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 Old bilateral basal ganglia and LEFT thalamus lacunar infarcts. Confluent supratentorial white matter FLAIR T2 hyperintensities. Old pontine lacunar infarcts. No midline shift, mass effect or masses. No abnormal extra-axial fluid collections. VASCULAR: Normal major intracranial vascular flow voids present at skull base. SKULL AND UPPER CERVICAL SPINE: No abnormal sellar expansion. No suspicious calvarial bone marrow signal. Craniocervical  junction maintained. SINUSES/ORBITS: Mild paranasal sinus mucosal thickening. Trace mastoid effusions. The included ocular globes and orbital contents are non-suspicious. OTHER: None. IMPRESSION: 1. 11 mm subacute RIGHT Corona radiata nonhemorrhagic infarct. 2. Moderate parenchymal brain volume loss, advanced for age. 3. Moderate to severe chronic small vessel ischemic disease. Old basal ganglia, LEFT thalamus and pontine lacunar infarcts. Electronically Signed   By: Elon Alas M.D.   On: 01/23/2017 14:57   Dg Chest Port 1 View  Result Date: 01/22/2017 CLINICAL DATA:  Possible aspiration.  Hypoxia. EXAM: PORTABLE CHEST 1 VIEW COMPARISON:  Chest radiograph 12/24/2013. FINDINGS: Normal cardiomediastinal silhouette. Tortuous thoracic aorta. Chronic elevation RIGHT hemidiaphragm. Small RIGHT effusion. There may be mild basilar atelectasis on the RIGHT, but no definite consolidation. I see no pneumothorax. Bones are unremarkable. IMPRESSION: Chronic elevation RIGHT hemidiaphragm. There may be a small effusion and mild basilar atelectasis on the RIGHT. No definite consolidation. Electronically Signed   By: Staci Righter M.D.   On: 01/22/2017 09:47    EKG:   Orders placed or performed during the hospital encounter of 01/20/17  . ED EKG  . ED EKG  . EKG 12-Lead  . EKG 12-Lead      Management plans discussed with the  patient's family and they are in agreement.  CODE STATUS:     Code Status Orders  (From admission, onward)        Start     Ordered   01/22/17 0934  Do not attempt resuscitation (DNR)  Continuous    Question Answer Comment  In the event of cardiac or respiratory ARREST Do not call a "code blue"   In the event of cardiac or respiratory ARREST Do not perform Intubation, CPR, defibrillation or ACLS   In the event of cardiac or respiratory ARREST Use medication by any route, position, wound care, and other measures to relive pain and suffering. May use oxygen, suction and manual treatment of airway obstruction as needed for comfort.      01/22/17 0933    Code Status History    Date Active Date Inactive Code Status Order ID Comments User Context   01/20/2017 20:49 01/22/2017 09:33 Full Code [id number]  Dustin Flock, MD Inpatient      TOTAL TIME TAKING CARE OF THIS PATIENT:45 minutes.   Note: This dictation was prepared with Dragon dictation along with smaller phrase technology. Any transcriptional errors that result from this process are unintentional.   @[facility] @  on 01/25/2017 at 1:27 PM  Between 7am to 6pm - Pager - [phone]  After 6pm go to [email or web address] - password [email or web address] [facility]  [facility]  Office  [phone]  CC: Primary care physician; Tower, Wynelle Fanny, MD

## 2017-01-25 NOTE — Progress Notes (Signed)
Pt discharged to [facility] via non-emergent [facility]. Pt discharged with 2 IV lines and foley catheter intact. Pt in no distress. Ammie Dalton, RN

## 2017-01-25 NOTE — Progress Notes (Signed)
New hospice home referral received from [facility] following a Palliative Medicine consult. Patient is a 61 year old woman with a known history of COPD, HTN, GERD, ETOH abuse and hypothyroidism, brought to the [facility] ED for evaluation of confusion. In the ED she was found to have severe hypokalemia, and AKI, she was admitted for treatment. Patient has received IV antibiotics and IV fluids, neurology was consulted, no acute processes noted on MRI. Palliative medicine was consulted for goals of care and has me with patient's husband over the last 3 days. Patient has remained obtunded, with no improvement in mentation, she is not eating or drinking. Following a meeting with Palliative Medicine NP Wadie Lessen this morning Mr. Holtzer has chosen to focus on comfort with transfer to the hospice home. Writer met in the room with Mr. Corky Mull to initiate education regarding hospice services, philosophy and team approach to care with good understanding voiced. Questions answered, consents signed. Plan is for transfer to the hospice home today via EMS with signed DNR in place. Patient information faxed to referral, Report called to the hospice home, EMS notified for transport. Hospital care team updated. Flo Shanks RN, BSN, [facility] [place], hospital Liaison [phone] c

## 2017-01-25 NOTE — Progress Notes (Signed)
Subjective: Patient remains poorly responsive.    Objective: Current vital signs: BP 137/70 (BP Location: Left Arm)   Pulse (!) 102   Temp 99.8 F (37.7 C) (Oral)   Resp 16   Ht 5\' 4"  (1.626 m)   Wt 85.8 kg (189 lb 4 oz)   SpO2 100%   BMI 32.48 kg/m  Vital signs in last 24 hours: Temp:  [98.6 F (37 C)-99.8 F (37.7 C)] 99.8 F (37.7 C) (12/06 0451) Pulse Rate:  [102-106] 102 (12/06 0451) Resp:  [16-17] 16 (12/06 0451) BP: (123-137)/(65-74) 137/70 (12/06 0451) SpO2:  [99 %-100 %] 100 % (12/06 0451)  Intake/Output from previous day: 12/05 0701 - 12/06 0700 In: 2458.6 [I.V.:1405; IV Piggyback:1053.6] Out: 1050 [Urine:1050] Intake/Output this shift: No intake/output data recorded. Nutritional status: Diet NPO time specified  Neurologic Exam: Mental Status: Eyes closed. Opens eyes, groans and localizes to deep sternal rub.  Cranial Nerves: II: patient does not respond confrontation bilaterally, pupils right58mm, left 2mm,and reactivebilaterally III,IV,VI: doll's responsepresent bilaterally with left gaze deviation V,VII: corneal reflex reduced on the right VIII: patient does not respond to verbal stimuli IX,X: gag reflexreduced, XI: trapezius strength unable to test bilaterally XII: tongue strength unable to test Motor: Moves all extremities to painful stimuli but not spontaneously or to command Sensory: Does not respond to noxious stimuli inthe upper extremities but responds to noxious stimuli in the lower extremities  Lab Results: Basic Metabolic Panel: Recent Labs  Lab 01/21/17 0614 01/21/17 1818 01/22/17 0435 01/23/17 0422 01/24/17 0424 01/25/17 0426  NA 134*  --  132* 129* 132* 134*  K 4.5 4.7 4.7 4.4 4.3 3.7  CL 103  --  102 100* 103 104  CO2 21*  --  21* 19* 21* 20*  GLUCOSE 98  --  91 104* 78 76  BUN 8  --  9 10 9 11   CREATININE 0.88  --  0.83 0.88 0.77 0.73  CALCIUM 9.2  --  8.6* 8.6* 8.4* 8.6*  MG 3.0*  --  1.7 1.5* 1.8 1.5*  PHOS 2.0*   --  2.9 2.8 3.8 3.3    Liver Function Tests: Recent Labs  Lab 01/20/17 1423 01/21/17 0614 01/24/17 0424  AST 17 27 30   ALT 9* 10* 12*  ALKPHOS 49 69 57  BILITOT 1.2 1.8* 0.9  PROT 4.8* 7.3 6.2*  ALBUMIN 2.3* 3.3* 2.7*   No results for input(s): LIPASE, AMYLASE in the last 168 hours. Recent Labs  Lab 01/20/17 1449  AMMONIA 13    CBC: Recent Labs  Lab 01/20/17 1423 01/21/17 0614 01/22/17 0447 01/23/17 0422 01/24/17 0424  WBC 2.4* 5.6 8.7 6.4 4.5  NEUTROABS 1.5  --  6.2 5.3 3.5  HGB 8.6* 12.3 11.9* 11.5* 11.0*  HCT 26.0* 36.9 35.6 34.4* 32.2*  MCV 98.8 98.3 100.1* 96.7 96.2  PLT 53* 70* 64* 69* 66*    Cardiac Enzymes: Recent Labs  Lab 01/20/17 1423  CKTOTAL 28*  TROPONINI <0.03    Lipid Panel: Recent Labs  Lab 01/25/17 0426  CHOL 101  TRIG 137  HDL 14*  CHOLHDL 7.2  VLDL 27  LDLCALC 60    CBG: No results for input(s): GLUCAP in the last 168 hours.  Microbiology: Results for orders placed or performed during the hospital encounter of 01/20/17  CULTURE, BLOOD (ROUTINE X 2) w Reflex to ID Panel     Status: None (Preliminary result)   Collection Time: 01/22/17  8:07 AM  Result Value Ref Range Status  Specimen Description BLOOD BLOOD RIGHT WRIST  Final   Special Requests   Final    BOTTLES DRAWN AEROBIC AND ANAEROBIC Blood Culture adequate volume   Culture NO GROWTH 3 DAYS  Final   Report Status PENDING  Incomplete  CULTURE, BLOOD (ROUTINE X 2) w Reflex to ID Panel     Status: None (Preliminary result)   Collection Time: 01/22/17  8:24 AM  Result Value Ref Range Status   Specimen Description BLOOD BLOOD RIGHT HAND  Final   Special Requests   Final    BOTTLES DRAWN AEROBIC AND ANAEROBIC Blood Culture adequate volume   Culture NO GROWTH 3 DAYS  Final   Report Status PENDING  Incomplete    Coagulation Studies: No results for input(s): LABPROT, INR in the last 72 hours.  Imaging: Mr Brain Wo Contrast  Result Date: 01/23/2017 CLINICAL DATA:   Altered mental status. Ceased alcohol consumption 1 week ago. History of hypertension, vasculitis and alcohol abuse. EXAM: MRI HEAD WITHOUT CONTRAST TECHNIQUE: Multiplanar, multiecho pulse sequences of the brain and surrounding structures were obtained without intravenous contrast. COMPARISON:  CT HEAD January 20, 2017. MRI of the head September 29, 2013. FINDINGS: BRAIN: 11 mm linear reduced diffusion RIGHT periventricular frontal white matter with normalized ADC values and FLAIR T2 hyperintense signal. No susceptibility artifact to suggest hemorrhage. Moderate ventriculomegaly on the basis of global parenchymal brain volume loss, mild ex vacuo dilatation RIGHT lateral ventricle. No hydrocephalus. 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 Old bilateral basal ganglia and LEFT thalamus lacunar infarcts. Confluent supratentorial white matter FLAIR T2 hyperintensities. Old pontine lacunar infarcts. No midline shift, mass effect or masses. No abnormal extra-axial fluid collections. VASCULAR: Normal major intracranial vascular flow voids present at skull base. SKULL AND UPPER CERVICAL SPINE: No abnormal sellar expansion. No suspicious calvarial bone marrow signal. Craniocervical junction maintained. SINUSES/ORBITS: Mild paranasal sinus mucosal thickening. Trace mastoid effusions. The included ocular globes and orbital contents are non-suspicious. OTHER: None. IMPRESSION: 1. 11 mm subacute RIGHT Corona radiata nonhemorrhagic infarct. 2. Moderate parenchymal brain volume loss, advanced for age. 3. Moderate to severe chronic small vessel ischemic disease. Old basal ganglia, LEFT thalamus and pontine lacunar infarcts. Electronically Signed   By: Elon Alas M.D.   On: 01/23/2017 14:57    Medications:  I have reviewed the patient's current medications. Scheduled: . glycopyrrolate  0.4 mg Intravenous TID  . nicotine  21 mg Transdermal Daily    Assessment/Plan: Patient not significantly improved.  Acute infarct  noted on MRI small and not the cause ofthis significant a mental status change.  Patient on broad spectrum antibiotics with no further fevers and improvement in wbc count.  With further focality on examination can not rule out a recurrent infarct versus seizure.  Initial EEG showed no epileptiform discharges.  Will repeat EEG.  Based on how aggressive family wishes to be will determine need to repeat head CT.    Recommendations: 1. EEG   LOS: 5 days   Alexis Goodell, MD Neurology [phone] 01/25/2017  11:56 AM

## 2017-01-25 NOTE — Progress Notes (Addendum)
Patient ID: DOROTHEE NAPIERKOWSKI, female   DOB: 1955/05/24, 61 y.o.   MRN: [id number]  This NP visited patient at the bedside as a follow up  for emotional support and palliative medicine needs.  Husband and two sister in laws at bedside   Patient remains obtunded, unable to follow commands.  She is tachycepneic with audible throat secretions.    Continued conversation regarding the current medical situation, advanced directive decisions and treatment option decisions  Mr Agrawal sees decline in the patient and verbalizes his belief that she is not going to improve to a point that reflects quality of life for his wife.     He wishes to shift focus of care to comfort and dignity and desires  no life prolonging interventions to prolong life.    - dc artificial hydration and IV antibiotics    - no further diagnostics    - leave foley for EOL care    - symptom management: Roxanol/robinol    - hopeful for hospice facility  Questions and concerns addressed   Discussed with Dr Margaretmary Eddy  Emotional support offered, encouraged husband to seek hospice support for bereavement in the future.   Time in  1000       Time out  1100   Total time spent on the  Unit was 60 minutes Greater than 50% of the time was spent in counseling and coordination of care  Wadie Lessen NP  Palliative Medicine Team Team Phone # [phone] Pager [phone]

## 2017-01-25 NOTE — Clinical Social Work Note (Signed)
CSW received referral from Palliative that patient's family would like to have patient go to [facility].  CSW contacted hospice nurse liaison who said a bed is available for today.  CSW to facilitate discharge planning to hospice home.  Jones Broom. [place], MSW, [place]  01/25/2017 12:02 PM

## 2017-01-25 NOTE — Clinical Social Work Note (Signed)
Patient to be d/c'ed today to [facility].  Patient and family agreeable to plans will transport via em[facility] hospice liaison RN to call report.  Evette Cristal, MSW, [place]

## 2017-01-26 ENCOUNTER — Telehealth: Payer: Self-pay

## 2017-01-26 LAB — VITAMIN B1: Vitamin B1 (Thiamine): 223.5 nmol/L — ABNORMAL HIGH (ref 66.5–200.0)

## 2017-01-26 NOTE — Telephone Encounter (Signed)
Copied from [facility] [place] [id number]. Topic: General - Other >> Jan 26, 2017  2:02 PM Bea Graff, NT wrote: Reason for CRM: Patients husband called and wanted the office to know his wife is in hospice.

## 2017-01-26 NOTE — Telephone Encounter (Signed)
Aware-thanks  I will follow along in epic and my best to her and her family

## 2017-01-27 LAB — CULTURE, BLOOD (ROUTINE X 2)
CULTURE: NO GROWTH
Culture: NO GROWTH
Special Requests: ADEQUATE
Special Requests: ADEQUATE

## 2017-01-29 ENCOUNTER — Ambulatory Visit: Payer: Medicare HMO | Admitting: Family Medicine

## 2017-01-30 LAB — BLOOD GAS, ARTERIAL
ACID-BASE DEFICIT: 0.9 mmol/L (ref 0.0–2.0)
BICARBONATE: 22 mmol/L (ref 20.0–28.0)
FIO2: 0.21
O2 Saturation: 94.3 %
PATIENT TEMPERATURE: 37
pCO2 arterial: 31 mmHg — ABNORMAL LOW (ref 32.0–48.0)
pH, Arterial: 7.46 — ABNORMAL HIGH (ref 7.350–7.450)
pO2, Arterial: 68 mmHg — ABNORMAL LOW (ref 83.0–108.0)

## 2017-02-20 DEATH — deceased

## 2017-09-21 ENCOUNTER — Ambulatory Visit: Payer: Medicare HMO

## 2017-09-28 ENCOUNTER — Encounter: Payer: Medicare HMO | Admitting: Family Medicine

## 2019-05-17 IMAGING — CT CT HEAD W/O CM
4 series · 16 of 47 positions shown, 18 images · non-contrast
Comparison: Multiple priors, most recent 12/19/2014. Prior MR
09/29/2013.

CLINICAL DATA: Unsteady with shuffling gait, worse in the past
week. Slurred speech. Multiple falls.

EXAM:
CT HEAD WITHOUT CONTRAST
TECHNIQUE: Contiguous axial images were obtained from the base of the skull
through the vertex without intravenous contrast.

[Series 2: head wo · axial · 0.42mm/px · z∈[+393,+503]mm · 7 of 30 slices shown, 9 images]
[im 4/30  brain]
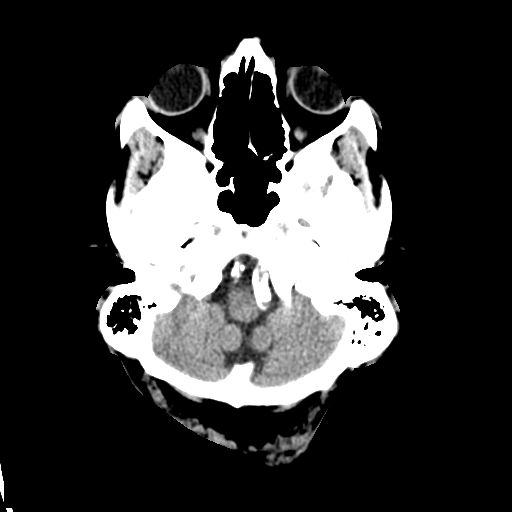
[im 4/30  bone]
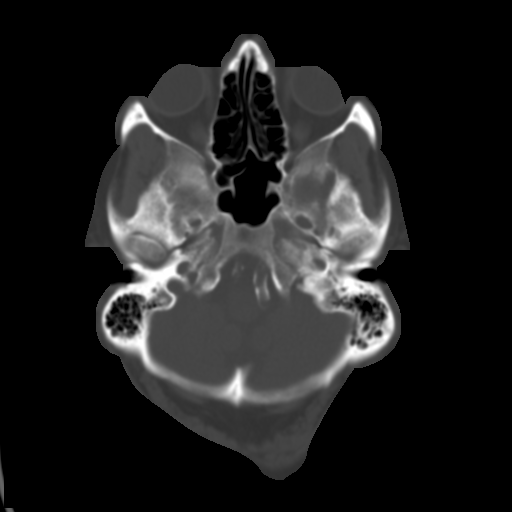
[im 8/30  brain]
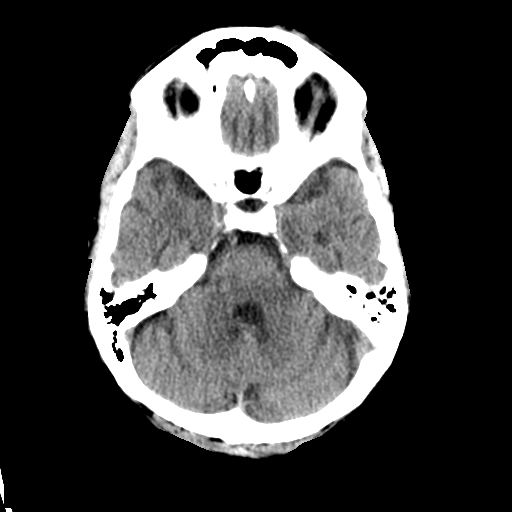
[im 11/30  brain]
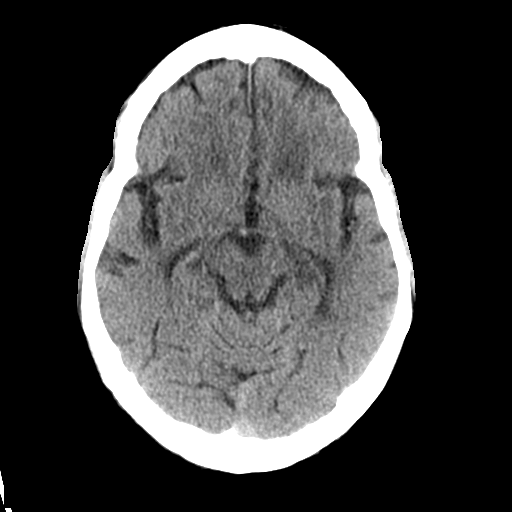
[im 15/30  brain]
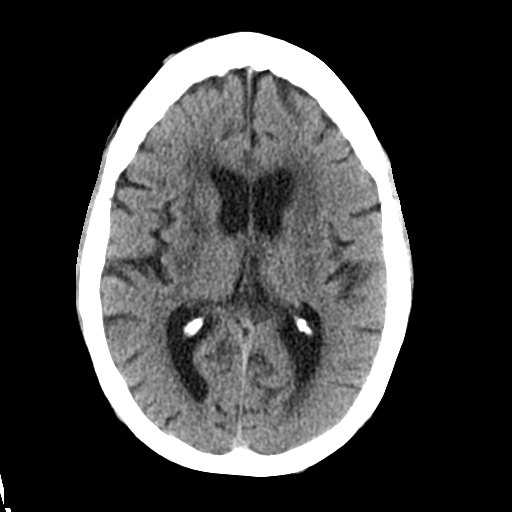
[im 19/30  brain]
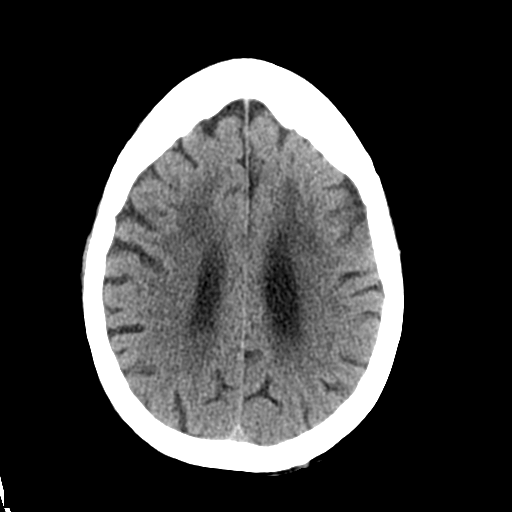
[im 19/30  bone]
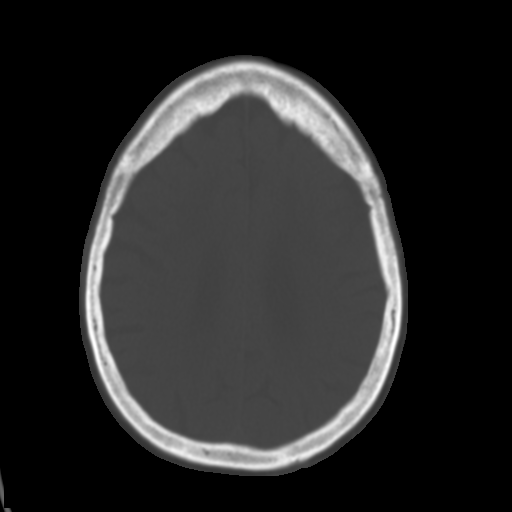
[im 22/30  brain]
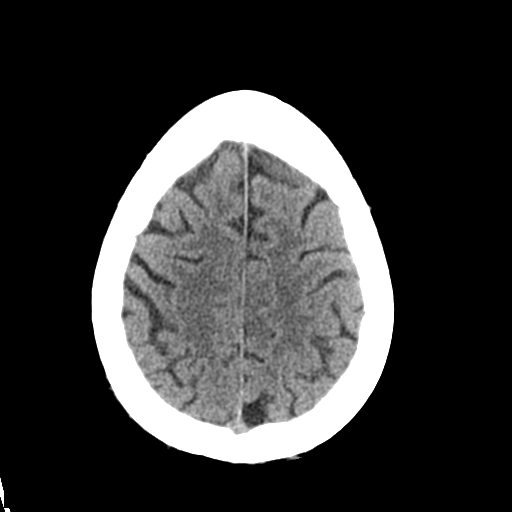
[im 26/30  brain]
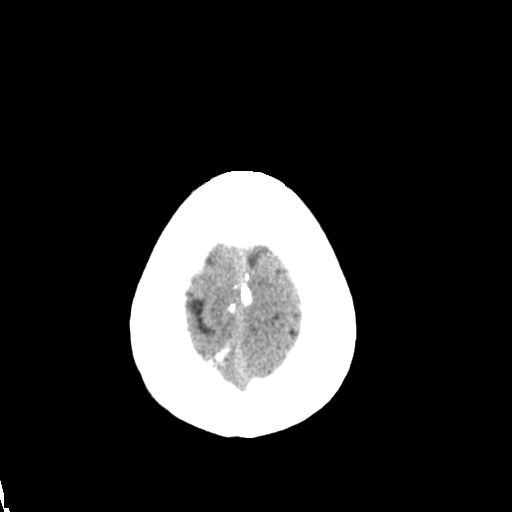

[Series 3: head bone · axial · 0.42mm/px · z∈[+392,+422]mm · 3 of 75 slices shown]
[im 8/75  bone]
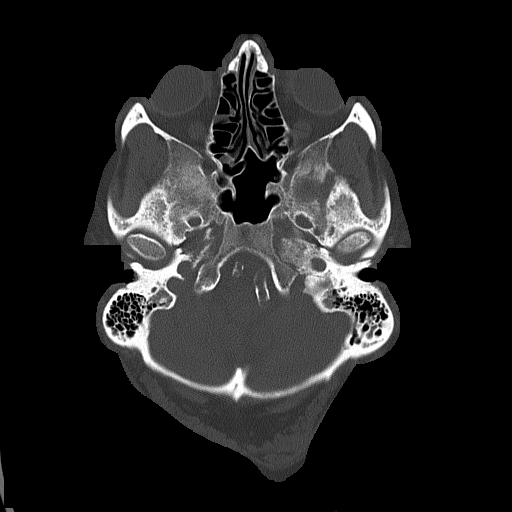
[im 15/75  bone]
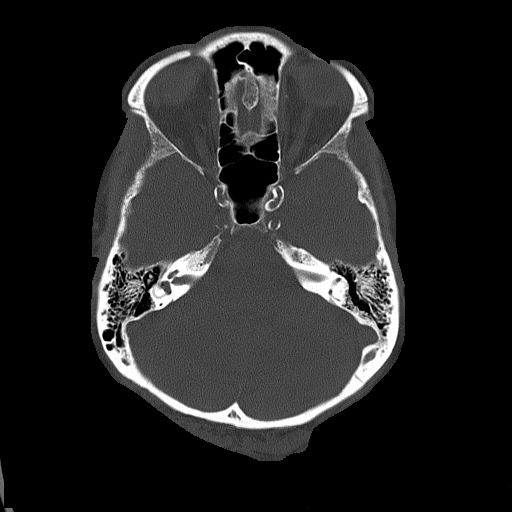
[im 23/75  bone]
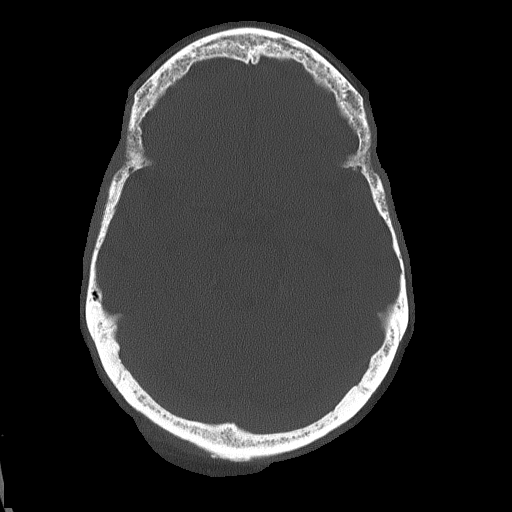

[Series 4: coronal soft tissue · coronal · 0.31mm/px · 3 of 60 slices shown]
[im 20/60  brain]
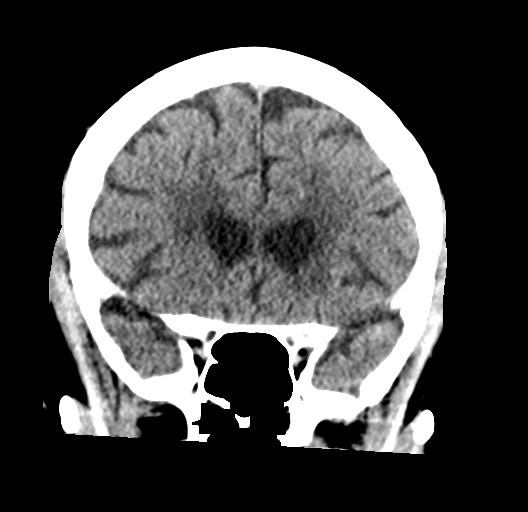
[im 27/60  brain]
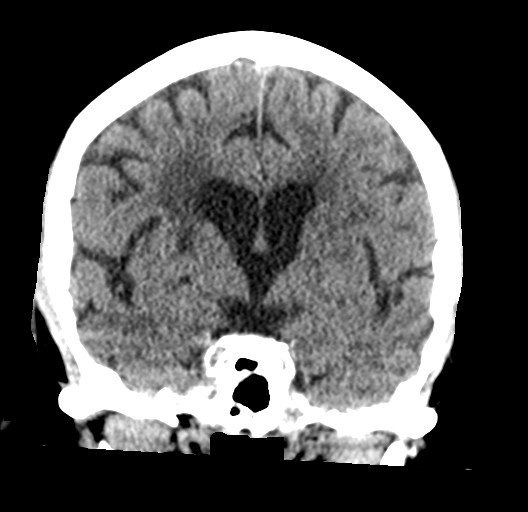
[im 33/60  brain]
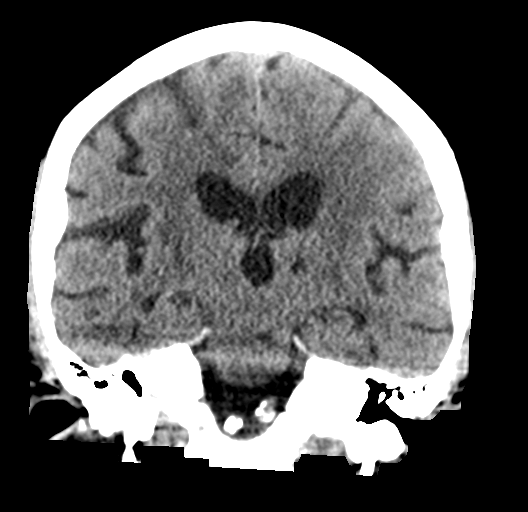

[Series 5: sagittal soft tissue · sagittal · 0.31mm/px · 3 of 48 slices shown]
[im 16/48  brain]
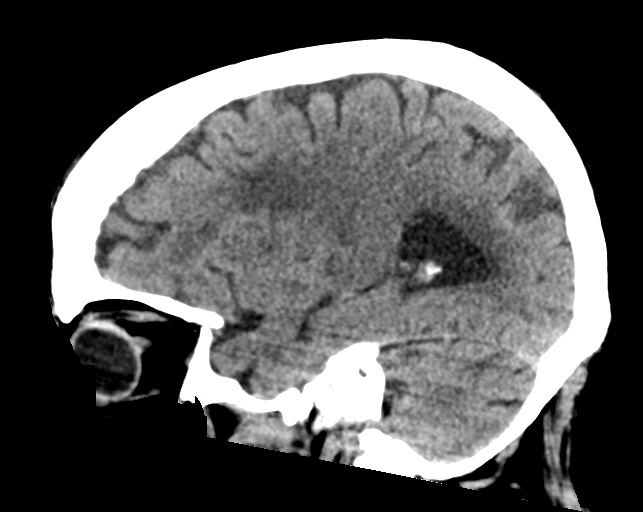
[im 24/48  brain]
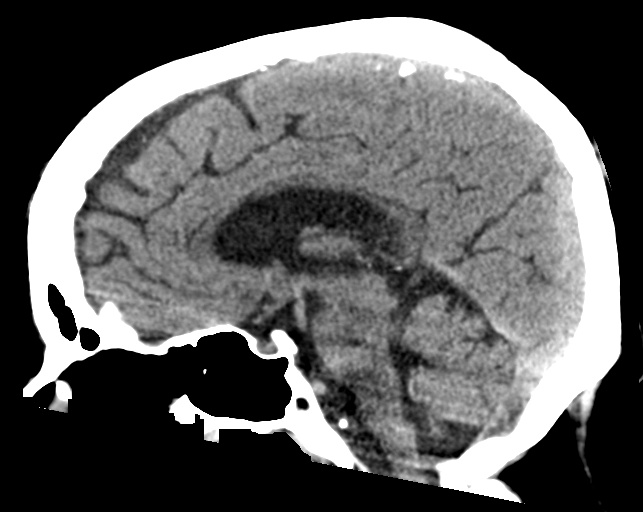
[im 32/48  brain]
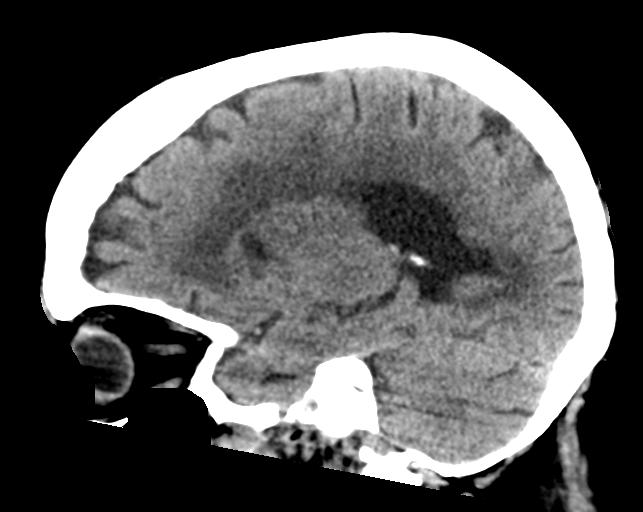

[16 of 47 positions shown; findings below may reference images not displayed]

FINDINGS: Brain: There is no acute stroke, acute hemorrhage, mass lesion, or
extra-axial fluid. There is moderate ventriculomegaly, likely
hydrocephalus ex vacuo. Extensive deep white matter hypoattenuation
consistent with small vessel disease. Remote LEFT greater than RIGHT
basal ganglia infarcts. Areas of chronic lacunar infarction affect
the deep nuclei, brainstem, and white matter.

Vascular: Calcification of the cavernous internal carotid arteries,
vertebral arteries, and basilar artery consistent with advanced
cerebrovascular atherosclerotic disease. No signs of intracranial
large vessel occlusion.

Skull: Normal. Negative for fracture or focal lesion.

Sinuses/Orbits: No acute finding.

Other: Overall, fairly similar appearance to previous CT head in
6940 except that the previously noted scalp hematoma has healed. The
upper cervical spine fractures are not visualized on this
examination.
IMPRESSION: Advanced cerebrovascular disease with multiple deep nuclei and white
matter areas of chronic infarction, small vessel disease,
hydrocephalus ex vacuo, and advanced intracranial vascular
calcification.

No acute findings are evident.
